# Patient Record
Sex: Female | Born: 1940
Health system: Southern US, Community
[De-identification: ages and names within clinical notes are randomized; demographics above are authoritative.]

## PROBLEM LIST (undated history)

## (undated) DIAGNOSIS — R002 Palpitations: Secondary | ICD-10-CM

## (undated) DIAGNOSIS — I10 Essential (primary) hypertension: Secondary | ICD-10-CM

## (undated) DIAGNOSIS — I4949 Other premature depolarization: Secondary | ICD-10-CM

## (undated) DIAGNOSIS — E785 Hyperlipidemia, unspecified: Secondary | ICD-10-CM

## (undated) DIAGNOSIS — I499 Cardiac arrhythmia, unspecified: Secondary | ICD-10-CM

## (undated) DIAGNOSIS — M1990 Unspecified osteoarthritis, unspecified site: Secondary | ICD-10-CM

## (undated) DIAGNOSIS — I77810 Thoracic aortic ectasia: Secondary | ICD-10-CM

## (undated) DIAGNOSIS — N189 Chronic kidney disease, unspecified: Secondary | ICD-10-CM

## (undated) HISTORY — PX: EYE SURGERY: SHX253

## (undated) HISTORY — PX: HERNIA REPAIR: SHX51

## (undated) HISTORY — DX: Hyperlipidemia, unspecified: E78.5

## (undated) HISTORY — PX: APPENDECTOMY: SHX54

## (undated) HISTORY — DX: Essential (primary) hypertension: I10

## (undated) HISTORY — DX: Other premature depolarization: I49.49

## (undated) HISTORY — DX: Chronic kidney disease, unspecified: N18.9

---

## 2003-11-25 ENCOUNTER — Other Ambulatory Visit: Payer: Self-pay

## 2005-10-02 ENCOUNTER — Ambulatory Visit: Payer: Self-pay | Admitting: Ophthalmology

## 2005-10-16 ENCOUNTER — Ambulatory Visit: Payer: Self-pay | Admitting: Ophthalmology

## 2007-03-10 ENCOUNTER — Ambulatory Visit: Payer: Self-pay

## 2007-03-19 ENCOUNTER — Emergency Department (HOSPITAL_COMMUNITY): Admission: EM | Admit: 2007-03-19 | Discharge: 2007-03-19 | Payer: Self-pay | Admitting: Emergency Medicine

## 2007-03-27 ENCOUNTER — Ambulatory Visit: Payer: Self-pay | Admitting: Cardiology

## 2007-04-01 ENCOUNTER — Encounter: Payer: Self-pay | Admitting: Family Medicine

## 2007-04-14 ENCOUNTER — Encounter: Payer: Self-pay | Admitting: Cardiology

## 2007-04-14 ENCOUNTER — Ambulatory Visit: Payer: Self-pay

## 2007-05-11 ENCOUNTER — Ambulatory Visit: Payer: Self-pay | Admitting: Family Medicine

## 2007-05-11 DIAGNOSIS — I10 Essential (primary) hypertension: Secondary | ICD-10-CM | POA: Insufficient documentation

## 2007-05-11 DIAGNOSIS — M5126 Other intervertebral disc displacement, lumbar region: Secondary | ICD-10-CM | POA: Insufficient documentation

## 2007-05-11 DIAGNOSIS — E785 Hyperlipidemia, unspecified: Secondary | ICD-10-CM | POA: Insufficient documentation

## 2007-09-03 ENCOUNTER — Ambulatory Visit: Payer: Self-pay | Admitting: Family Medicine

## 2007-09-03 DIAGNOSIS — L299 Pruritus, unspecified: Secondary | ICD-10-CM | POA: Insufficient documentation

## 2007-11-18 ENCOUNTER — Ambulatory Visit: Payer: Self-pay | Admitting: Family Medicine

## 2007-11-19 ENCOUNTER — Ambulatory Visit: Payer: Self-pay | Admitting: Family Medicine

## 2007-11-19 DIAGNOSIS — M25559 Pain in unspecified hip: Secondary | ICD-10-CM | POA: Insufficient documentation

## 2007-11-23 LAB — CONVERTED CEMR LAB
Albumin: 3.7 g/dL (ref 3.5–5.2)
Alkaline Phosphatase: 65 units/L (ref 39–117)
BUN: 24 mg/dL — ABNORMAL HIGH (ref 6–23)
GFR calc Af Amer: 49 mL/min
LDL Cholesterol: 50 mg/dL (ref 0–99)
Potassium: 4.4 meq/L (ref 3.5–5.1)
Sodium: 140 meq/L (ref 135–145)
Total CHOL/HDL Ratio: 2
Triglycerides: 92 mg/dL (ref 0–149)
VLDL: 18 mg/dL (ref 0–40)

## 2008-12-26 ENCOUNTER — Encounter: Payer: Self-pay | Admitting: Family Medicine

## 2008-12-28 LAB — CONVERTED CEMR LAB
Pap Smear: NORMAL
Pap Smear: NORMAL
Pap Smear: NORMAL
Pap Smear: NORMAL
Pap Smear: NORMAL
Pap Smear: NORMAL
Pap Smear: NORMAL

## 2008-12-28 LAB — HM PAP SMEAR

## 2009-02-08 LAB — HM MAMMOGRAPHY: HM Mammogram: NORMAL

## 2009-02-10 ENCOUNTER — Telehealth: Payer: Self-pay | Admitting: Family Medicine

## 2009-02-10 ENCOUNTER — Ambulatory Visit: Payer: Self-pay | Admitting: Family Medicine

## 2009-02-10 LAB — CONVERTED CEMR LAB
Bilirubin Urine: NEGATIVE
Glucose, Urine, Semiquant: NEGATIVE
Ketones, urine, test strip: NEGATIVE
RBC / HPF: 0
Urobilinogen, UA: 0.2
pH: 6

## 2009-02-11 ENCOUNTER — Other Ambulatory Visit: Payer: Self-pay

## 2009-02-16 ENCOUNTER — Ambulatory Visit: Payer: Self-pay | Admitting: Family Medicine

## 2009-02-17 ENCOUNTER — Encounter: Payer: Self-pay | Admitting: Family Medicine

## 2009-02-17 LAB — CONVERTED CEMR LAB: Protein, Ur: 24 mg/24hr — ABNORMAL LOW (ref 50–100)

## 2009-03-01 ENCOUNTER — Telehealth: Payer: Self-pay | Admitting: Family Medicine

## 2009-03-23 ENCOUNTER — Encounter (INDEPENDENT_AMBULATORY_CARE_PROVIDER_SITE_OTHER): Payer: Self-pay | Admitting: *Deleted

## 2009-03-23 ENCOUNTER — Encounter: Payer: Self-pay | Admitting: Family Medicine

## 2009-03-23 LAB — CONVERTED CEMR LAB
AST: 23 units/L
Albumin: 4.4 g/dL
Calcium: 9.6 mg/dL
Chloride: 101 meq/L
Creatinine, Ser: 1.24 mg/dL
Glucose, Bld: 106 mg/dL
Iron: 100 ug/dL
MCHC: 34.7 g/dL
MCV: 92 fL
Monocytes Absolute: 0.4 10*3/uL
Monocytes Relative: 7 %
Platelets: 227 10*3/uL
Potassium: 3.9 meq/L
RBC: 4.12 M/uL
Saturation Ratios: 28 %
Sodium: 140 meq/L
TIBC: 354 ug/dL
UIBC: 254 ug/dL

## 2009-03-29 ENCOUNTER — Encounter: Payer: Self-pay | Admitting: Family Medicine

## 2009-04-11 ENCOUNTER — Telehealth: Payer: Self-pay | Admitting: Family Medicine

## 2009-04-21 ENCOUNTER — Encounter (INDEPENDENT_AMBULATORY_CARE_PROVIDER_SITE_OTHER): Payer: Self-pay | Admitting: *Deleted

## 2009-05-10 ENCOUNTER — Ambulatory Visit: Payer: Self-pay | Admitting: Family Medicine

## 2009-05-10 DIAGNOSIS — N183 Chronic kidney disease, stage 3 unspecified: Secondary | ICD-10-CM | POA: Insufficient documentation

## 2009-05-15 ENCOUNTER — Encounter: Payer: Self-pay | Admitting: Family Medicine

## 2009-08-23 ENCOUNTER — Encounter: Payer: Self-pay | Admitting: Family Medicine

## 2009-11-09 ENCOUNTER — Telehealth: Payer: Self-pay | Admitting: Family Medicine

## 2009-11-14 ENCOUNTER — Ambulatory Visit: Payer: Self-pay | Admitting: Family Medicine

## 2009-11-14 DIAGNOSIS — R109 Unspecified abdominal pain: Secondary | ICD-10-CM | POA: Insufficient documentation

## 2009-11-14 LAB — CONVERTED CEMR LAB
Blood in Urine, dipstick: NEGATIVE
Glucose, Urine, Semiquant: NEGATIVE
Protein, U semiquant: 100
Urobilinogen, UA: 0.2
WBC Urine, dipstick: NEGATIVE
WBC, UA: 0 cells/hpf
pH: 6

## 2009-11-17 ENCOUNTER — Telehealth: Payer: Self-pay | Admitting: Family Medicine

## 2009-12-06 ENCOUNTER — Encounter: Payer: Self-pay | Admitting: Family Medicine

## 2009-12-12 ENCOUNTER — Encounter (INDEPENDENT_AMBULATORY_CARE_PROVIDER_SITE_OTHER): Payer: Self-pay | Admitting: *Deleted

## 2009-12-12 ENCOUNTER — Encounter: Payer: Self-pay | Admitting: Family Medicine

## 2009-12-12 LAB — CONVERTED CEMR LAB
AST: 20 units/L
Albumin: 4 g/dL
Calcium: 9.9 mg/dL
Chloride: 101 meq/L
Potassium: 4.9 meq/L
Sodium: 143 meq/L
Total Bilirubin: 0.3 mg/dL
Total Protein: 6.8 g/dL

## 2009-12-18 ENCOUNTER — Encounter: Payer: Self-pay | Admitting: Family Medicine

## 2009-12-18 LAB — CONVERTED CEMR LAB
ALT: 16 units/L
AST: 20 units/L
Creatinine, Ser: 117 mg/dL

## 2010-02-26 ENCOUNTER — Encounter: Payer: Self-pay | Admitting: Family Medicine

## 2010-03-01 ENCOUNTER — Encounter (INDEPENDENT_AMBULATORY_CARE_PROVIDER_SITE_OTHER): Payer: Self-pay | Admitting: *Deleted

## 2010-03-01 ENCOUNTER — Ambulatory Visit: Payer: Self-pay | Admitting: Family Medicine

## 2010-03-01 LAB — CONVERTED CEMR LAB
ALT: 14 units/L
AST: 19 units/L
Alkaline Phosphatase: 75 units/L
Basophils Absolute: 0 10*3/uL
Basophils Relative: 0 %
Calcium: 9.7 mg/dL (ref 8.4–10.5)
Creatinine, Ser: 1.1 mg/dL (ref 0.4–1.2)
Eosinophils Absolute: 0.1 10*3/uL
Glucose, Bld: 85 mg/dL (ref 70–99)
MCHC: 33.5 g/dL
MCV: 90 fL
Monocytes Relative: 6 %
Neutrophils Relative %: 62 %
Platelets: 226 10*3/uL
Potassium: 4.6 meq/L
RBC: 4.33 M/uL
RDW: 14.3 %
Sodium: 142 meq/L
Sodium: 144 meq/L (ref 135–145)
Total Bilirubin: 0.3 mg/dL
Total Protein: 6.8 g/dL
Uric Acid, Serum: 7.1 mg/dL — ABNORMAL HIGH (ref 2.4–7.0)

## 2010-03-08 ENCOUNTER — Encounter: Payer: Self-pay | Admitting: Family Medicine

## 2010-03-16 ENCOUNTER — Ambulatory Visit: Payer: Self-pay | Admitting: Family Medicine

## 2010-03-27 ENCOUNTER — Ambulatory Visit: Payer: Self-pay | Admitting: Family Medicine

## 2010-03-27 ENCOUNTER — Telehealth: Payer: Self-pay | Admitting: Family Medicine

## 2010-03-27 DIAGNOSIS — H6120 Impacted cerumen, unspecified ear: Secondary | ICD-10-CM | POA: Insufficient documentation

## 2010-04-27 ENCOUNTER — Ambulatory Visit: Payer: Self-pay | Admitting: Family Medicine

## 2010-04-27 DIAGNOSIS — R7989 Other specified abnormal findings of blood chemistry: Secondary | ICD-10-CM | POA: Insufficient documentation

## 2010-06-11 ENCOUNTER — Ambulatory Visit: Payer: Self-pay | Admitting: Family Medicine

## 2010-06-28 ENCOUNTER — Encounter: Payer: Self-pay | Admitting: Family Medicine

## 2010-08-06 ENCOUNTER — Ambulatory Visit: Payer: Self-pay | Admitting: Family Medicine

## 2010-08-06 DIAGNOSIS — J209 Acute bronchitis, unspecified: Secondary | ICD-10-CM | POA: Insufficient documentation

## 2010-10-14 LAB — CONVERTED CEMR LAB
BUN: 27 mg/dL — ABNORMAL HIGH (ref 6–23)
CO2: 31 meq/L (ref 19–32)
Chloride: 104 meq/L (ref 96–112)
GFR calc non Af Amer: 39.96 mL/min (ref >60)
Glucose, Bld: 95 mg/dL (ref 70–99)
Potassium: 4.1 meq/L (ref 3.5–5.1)
Sodium: 140 meq/L (ref 135–145)

## 2010-10-16 NOTE — Consult Note (Signed)
Summary: [facility]   Imported By: Sherian Rein 03/12/2010 08:34:58  _____________________________________________________________________  External Attachment:    Type:   Image     Comment:   External Document

## 2010-10-16 NOTE — Progress Notes (Signed)
Summary: Blood pressure readings  Phone Note Call from Patient Call back at Home Phone [phone]   Caller: Patient Call For: Kerby Nora MD Summary of Call: Blood pressure readings: 11/15/2009 140/82, 11/16/2009 144/80 pulse 60, 11/17/2009 122/80 pulse 76.   Initial call taken by: Linde Gillis CMA Duncan Dull),  November 17, 2009 9:21 AM  Follow-up for Phone Call        Okay to hold on starting new BP med prior to vacation to [place]..bu tcontinue to chesk BPs after returning home and call if more than 3 measurments above 140/90.  Follow-up by: Kerby Nora MD,  November 17, 2009 9:24 AM  Additional Follow-up for Phone Call Additional follow up Details #1::        Patient advised of instructions Additional Follow-up by: Benny Lennert CMA Duncan Dull),  November 17, 2009 9:35 AM

## 2010-10-16 NOTE — Consult Note (Signed)
Summary: [facility]   Imported By: Lanelle Bal 12/16/2009 09:42:32  _____________________________________________________________________  External Attachment:    Type:   Image     Comment:   External Document  Appended Document: [facility] Heather-please enter..lipids, Hg, TSH, creatinine, A1C and  AST/ALT  if present into EMR.  Thanks.

## 2010-10-16 NOTE — Miscellaneous (Signed)
Summary: Clinical Update/[facility]   Clinical Lists Changes  Observations: Added new observation of SGPT (ALT): 16 units/L (12/18/2009 10:57) Added new observation of SGOT (AST): 20 units/L (12/18/2009 10:57) Added new observation of CREATININE: 117 mg/dL (09/73/5329 92:42) Added new observation of CHOLESTEROL: 153 mg/dL (68/34/1962 22:97)

## 2010-10-16 NOTE — Progress Notes (Signed)
Summary: re: hctz   Phone Note Call from Patient Call back at [facility] Phone [phone]   Caller: Patient Call For: Kerby Nora MD Summary of Call: Patient called asking if she needs to stop taking her HCTZ.  Initial call taken by: Melody Comas,  March 27, 2010 4:51 PM  Follow-up for Phone Call        [place]...i sent flag earlier today to have you call Dr. Cherylann Ratel... can you get his number so i can call him about her meds.  Please call Natasha Sawyer and tell her to continue HCTZ until we are able to contact him...likely Friday.  Follow-up by: Kerby Nora MD,  March 27, 2010 5:37 PM  Additional Follow-up for Phone Call Additional follow up Details #1::        Patient advised.Consuello Masse CMA   Additional Follow-up by: Benny Lennert CMA Duncan Dull),  March 28, 2010 7:50 AM     Appended Document: re: hctz  I never heard back about this...see recent uric acid labs.   Appended Document: re: hctz  Patient advised.Consuello Masse CMA

## 2010-10-16 NOTE — Progress Notes (Signed)
Summary: refill request for [facility]  Phone Note Refill Request Call back at Home Phone [phone] Message from:  Patient  Refills Requested: Medication #1:  AMBIEN 5 MG  TABS 1 by mouth once daily Phoned request from pt, please send to [facility] [address]  Initial call taken by: Lowella Petties CMA,  November 09, 2009 12:44 PM  Follow-up for Phone Call        Rx called to pharmacy Follow-up by: Benny Lennert CMA Duncan Dull),  November 10, 2009 12:39 PM    Prescriptions: AMBIEN 5 MG  TABS (ZOLPIDEM TARTRATE) 1 by mouth once daily  #30 x 0   Entered and Authorized by:   Kerby Nora MD   Signed by:   Kerby Nora MD on 11/10/2009   Method used:   Telephoned to ...       [facility] [address] [phone]* (retail)       [address] [place], [place]  [id number]       Ph: [phone]       Fax: [phone]   RxID:   [id number]

## 2010-10-16 NOTE — Assessment & Plan Note (Signed)
Summary: TALK ABOUT URIC ACID LAB RESULTS   Vital Signs:  Patient profile:   70 year old female Height:      60.5 inches Weight:      156.6 pounds BMI:     30.19 Temp:     97.8 degrees F oral Pulse rate:   68 / minute Pulse rhythm:   regular BP sitting:   100 / 60  (left arm) Cuff size:   regular  Vitals Entered By: Benny Lennert CMA Duncan Dull) (March 27, 2010 9:16 AM)  History of Present Illness: Chief complaint Talk about Uric acid lab result  Seen in early June by Dr. Serita Butcher for the following: Pt here for right second toe that is slightly red, slightly swollen and slightly painful on the medial side distal phalanx that started yesterday. The nurse at [facility] thought it to be gout but is in atypical location and not nearly as acute as gout usually is. She denies bites, no unusal walking environments (she does wear sandals a lot) no edema of the feet otherwise, no new footwear or socks, soaps, etc. She otherwise feels well. Started yest AM when she awakened...it did not wake her up. Bothers her most on the side of the toe when walking. She is on a diuretic, has never had gout before, nor has anyone she knows of in her family. She has not radically altered her diet lately.  Uric acid level was 7.2. She reports that 2nd toe pain has improved over time in last few weeks. USed tylenol.  BP well controlled on amlodipine and 1/2 tab HCTZ.   Problems Prior to Update: 1)  Pain in Second Toe Right Foot  (ICD-729.5) 2)  Abdominal Pain, Lower  (ICD-789.09) 3)  Hyperkalemia  (ICD-276.7) 4)  Hip Pain, Left  (ICD-719.45) 5)  Chronic Kidney Disease Stage Iii (MODERATE)  (ICD-585.3) 6)  Unspecified Pruritic Disorder  (ICD-698.9) 7)  Hypertension  (ICD-401.9) 8)  Hyperlipidemia  (ICD-272.4) 9)  Cardiac Arrhythmias, Hx of Palpitations  (ICD-V12.50) 10)  Displacement, Lumbar Disc w/o Myelopathy  (ICD-722.10)  Current Medications (verified): 1)  Toprol Xl 25 Mg  Tb24 (Metoprolol Succinate)  .Marland Kitchen.. 1 By Mouth Once Daily 2)  Vytorin 10-20 Mg  Tabs (Ezetimibe-Simvastatin) .Marland Kitchen.. 1 By Mouth Once Daily 3)  Ambien 5 Mg  Tabs (Zolpidem Tartrate) .Marland Kitchen.. 1 By Mouth Once Daily 4)  Vitamin D 1000 Unit  Tabs (Cholecalciferol) .... Take 1 Tablet By Mouth Once A Day 5)  Hydrochlorothiazide 25 Mg Tabs (Hydrochlorothiazide) .... Take 1/2  Tablet By Mouth Once A Day 6)  Amlodipine Besylate 5 Mg Tabs (Amlodipine Besylate) .... Take 1 Tablet By Mouth Once A Day 7)  Enalapril Maleate 5 Mg Tabs (Enalapril Maleate) .... One Tablet Daily  Allergies (verified): No Known Drug Allergies  Past History:  Past medical, surgical, family and social histories (including risk factors) reviewed, and no changes noted (except as noted below).  Past Medical History: Reviewed history from 05/11/2007 and no changes required. Hyperlipidemia Hypertension  Past Surgical History: Reviewed history from 05/11/2007 and no changes required. MRI spine: bulging disc C6-C7  March 10, 2007, a CT of the chest showing no evidence of      pulmonary embolism.  There was a very small pericardial effusion      and small hiatal hernia.     b.     March 11, 2007, x-ray of the thoracic spine showing no      evidence of compression fracture of the thoracic spine.  There are      mild degenerative changes of the thoracic spine.     c.     March 14, 2007, MRI of the thoracic spine without contrast,      revealing very small focal protrusion of the T7-8, T9-10 without      narrowing of the spinal disks, without spinal canal stenosis or      narrowing of the neural foramina.     d.     March 11, 2007, x-ray of the cervical spine showing      degenerative joint disease of C5-C6 and C6-C7.     e.     March 14, 2007, MRI of the cervical spine without contrast      revealing mild disk bulging and uncinate spurring bilaterally at      C5-6 with moderate narrowing of the neural foramina bilaterally.      There is minimal bulging at C3-4 and mild  bulging at C5-6 and C6-  hernia repair 2003 stomach stapling 1980s 3 csections Appendectomy cataract and eyelid surgery EHCO EF 60% no pericardial effusion 03/2007 DXA 2007 wnl  Family History: Reviewed history from 05/11/2007 and no changes required. father: died age 74 cerebral hemmorhage mother died age late 1s DM no siblings no cancer no MI < 30  Social History: Reviewed history from 05/11/2007 and no changes required. lives at [facility] widowed  Review of Systems General:  Denies fatigue and fever. Eyes:  Denies blurring, discharge, and itching. ENT:  Complains of ringing in ears; denies nasal congestion, nosebleeds, and postnasal drainage; left ear full, decreased hearing   Ringin in both ears for years. itching in left ear for a while. Tried ear syringe at home...no wax came out. . CV:  Denies chest pain or discomfort. Resp:  Denies cough.  Physical Exam  General:  Well-developed,well-nourished,in no acute distress; alert,appropriate and cooperative throughout examination Eyes:  No corneal or conjunctival inflammation noted. EOMI. Perrla. Funduscopic exam benign, without hemorrhages, exudates or papilledema. Vision grossly normal. Ears:  left TM occluded with wax Nose:  External nasal examination shows no deformity or inflammation. Nasal mucosa are pink and moist without lesions or exudates. Mouth:  Oral mucosa and oropharynx without lesions or exudates.  Teeth in good repair. Neck:  no carotid bruit or thyromegaly no cervical or supraclavicular lymphadenopathy  Lungs:  Normal respiratory effort, chest expands symmetrically. Lungs are clear to auscultation, no crackles or wheezes. Heart:  Normal rate and regular rhythm. S1 and S2 normal without gallop, murmur, click, rub or other extra sounds. Msk:  lkeft 2nd DIP joint mild swelling, no erythema, no pain.  Ankel and foot exam wnl.  Pulses:  R and L posterior tibial pulses are full and equal bilaterally    Extremities:  no edema    Impression & Recommendations:  Problem # 1:  CERUMEN IMPACTION (ICD-380.4) Resolved with irrigation.  Mild abtraision in left ear canal after wax removed.   Problem # 2:  PAIN IN SECOND TOE RIGHT FOOT (ICD-729.5) Doubt gout, but uric acid elevated. Pt wishes me to speak with Dr. Cherylann Ratel about the issue. Paln on D/C HCTZ and recheck.   Problem # 3:  HYPERTENSION (ICD-401.9) Well controlled. Continue current medication.  Her updated medication list for this problem includes:    Toprol Xl 25 Mg Tb24 (Metoprolol succinate) .Marland Kitchen... 1 by mouth once daily    Hydrochlorothiazide 25 Mg Tabs (Hydrochlorothiazide) .Marland Kitchen... Take 1/2  tablet by mouth once a day  Amlodipine Besylate 5 Mg Tabs (Amlodipine besylate) .Marland Kitchen... Take 1 tablet by mouth once a day    Enalapril Maleate 5 Mg Tabs (Enalapril maleate) ..... One tablet daily  Complete Medication List: 1)  Toprol Xl 25 Mg Tb24 (Metoprolol succinate) .Marland Kitchen.. 1 by mouth once daily 2)  Vytorin 10-20 Mg Tabs (Ezetimibe-simvastatin) .Marland Kitchen.. 1 by mouth once daily 3)  Ambien 5 Mg Tabs (Zolpidem tartrate) .Marland Kitchen.. 1 by mouth once daily 4)  Vitamin D 1000 Unit Tabs (Cholecalciferol) .... Take 1 tablet by mouth once a day 5)  Hydrochlorothiazide 25 Mg Tabs (Hydrochlorothiazide) .... Take 1/2  tablet by mouth once a day 6)  Amlodipine Besylate 5 Mg Tabs (Amlodipine besylate) .... Take 1 tablet by mouth once a day 7)  Enalapril Maleate 5 Mg Tabs (Enalapril maleate) .... One tablet daily  Patient Instructions: 1)  We will call you regarding stopping HCTZ.  2)  Return in 1 month for uric acid.  Current Allergies (reviewed today): No known allergies

## 2010-10-16 NOTE — Consult Note (Signed)
Summary: [facility]   Imported By: Lanelle Bal 07/05/2010 11:36:04  _____________________________________________________________________  External Attachment:    Type:   Image     Comment:   External Document

## 2010-10-16 NOTE — Miscellaneous (Signed)
   Clinical Lists Changes  Observations: Added new observation of TSH: 48 microintl units/mL (03/01/2010 11:18) Added new observation of ABSOLUTE BAS: 0.0 K/uL (03/01/2010 11:18) Added new observation of BASOPHIL %: 0 % (03/01/2010 11:18) Added new observation of EOS ABSLT: 0.1 K/uL (03/01/2010 11:18) Added new observation of % EOS AUTO: 2 % (03/01/2010 11:18) Added new observation of ABSOLUTE MON: 0.3 K/uL (03/01/2010 11:18) Added new observation of MONOCYTE %: 6 % (03/01/2010 11:18) Added new observation of ABS LYMPHOCY: 1.7 K/uL (03/01/2010 11:18) Added new observation of LYMPHS %: 30 % (03/01/2010 11:18) Added new observation of ABS NEUTROPH: 3.5 K/uL (03/01/2010 11:18) Added new observation of PMN %: 62 % (03/01/2010 11:18) Added new observation of PLATELETK/UL: 226 K/uL (03/01/2010 11:18) Added new observation of RDW: 14.3 % (03/01/2010 11:18) Added new observation of MCHC RBC: 33.5 g/dL (16/06/9603 54:09) Added new observation of MCV: 90 fL (03/01/2010 11:18) Added new observation of HCT: 38.0 % (03/01/2010 11:18) Added new observation of HGB: 13.0 g/dL (81/19/1478 29:56) Added new observation of RBC M/UL: 4.33 M/uL (03/01/2010 11:18) Added new observation of WBC COUNT: 5.6 10*3/microliter (03/01/2010 11:18) Added new observation of CALCIUM: 9.6 mg/dL (21/30/8657 84:69) Added new observation of ALBUMIN: 4.3 g/dL (62/95/2841 32:44) Added new observation of PROTEIN, TOT: 6.8 g/dL (09/18/7251 66:44) Added new observation of SGPT (ALT): 14 units/L (03/01/2010 11:18) Added new observation of SGOT (AST): 19 units/L (03/01/2010 11:18) Added new observation of ALK PHOS: 75 units/L (03/01/2010 11:18) Added new observation of BILI TOTAL: 0.3 mg/dL (03/47/4259 56:38) Added new observation of GFRAA: 55 mL/min (03/01/2010 11:18) Added new observation of GFR: 47 mL/min (03/01/2010 11:18) Added new observation of CREATININE: 1.19 mg/dL (75/64/3329 51:88) Added new observation of BUN: 27  mg/dL (41/66/0630 16:01) Added new observation of BG RANDOM: 108 mg/dL (09/32/3557 32:20) Added new observation of CL SERUM: 102 meq/L (03/01/2010 11:18) Added new observation of K SERUM: 4.6 meq/L (03/01/2010 11:18) Added new observation of NA: 142 meq/L (03/01/2010 11:18)

## 2010-10-16 NOTE — Miscellaneous (Signed)
   Clinical Lists Changes  Observations: Added new observation of TRIGLYCERIDE: 68 mg/dL (60/45/4098 11:91) Added new observation of LDL: 60 mg/dL (47/82/9562 13:08) Added new observation of HDL: 68 mg/dL (65/78/4696 29:52) Added new observation of CHOLESTEROL: 153 mg/dL (84/13/2440 10:27) Added new observation of CALCIUM: 9.9 mg/dL (25/36/6440 34:74) Added new observation of ALBUMIN: 4.0 g/dL (25/95/6387 56:43) Added new observation of PROTEIN, TOT: 6.8 g/dL (32/95/1884 16:60) Added new observation of SGPT (ALT): 16 units/L (12/12/2009 14:13) Added new observation of SGOT (AST): 20 units/L (12/12/2009 14:13) Added new observation of BILI TOTAL: 0.3 mg/dL (63/09/6008 93:23) Added new observation of GFRAA: 56 mL/min (12/12/2009 14:13) Added new observation of GFR: 46 mL/min (12/12/2009 14:13) Added new observation of CREATININE: 1.17 mg/dL (55/73/2202 54:27) Added new observation of BUN: 24 mg/dL (03/09/7627 31:51) Added new observation of BG RANDOM: 102 mg/dL (76/16/0737 10:62) Added new observation of CL SERUM: 101 meq/L (12/12/2009 14:13) Added new observation of K SERUM: 4.9 meq/L (12/12/2009 14:13) Added new observation of NA: 143 meq/L (12/12/2009 14:13)

## 2010-10-16 NOTE — Assessment & Plan Note (Signed)
Summary: GOUT?  BEDSOLE IS FULL/DLO   Vital Signs:  Patient profile:   70 year old female Weight:      155 pounds Temp:     98.2 degrees F oral Pulse rate:   68 / minute Pulse rhythm:   regular BP sitting:   126 / 86  (left arm) Cuff size:   large  Vitals Entered By: Sydell Axon LPN (March 01, 2010 11:41 AM) CC: ? Gout, red, swollen and painful 2nd toe on right foot   History of Present Illness: Pt here for right second toe that is slightly red, slightly swollen and slightly painful on the medial side distal phalanx that started yesterday. The nurse at [facility] thought it to be gout but is in atypical location and not nearly as acute as gout usually is. She denies bites, no unusal walking environments (she does wear sandals a lot) no edema of the feet otherwise, no new footwear or socks, soaps, etc. She otherwise feels well. Started yest AM when she awakened...it did not wake her up. Bothers her most on the side of the toe when walking. She is on a diuretic, has never had gout before, nor has anyone she knows of in her family. She has not radically altered her diet lately.  Problems Prior to Update: 1)  Abdominal Pain, Lower  (ICD-789.09) 2)  Hyperkalemia  (ICD-276.7) 3)  Hip Pain, Left  (ICD-719.45) 4)  Chronic Kidney Disease Stage Iii (MODERATE)  (ICD-585.3) 5)  Unspecified Pruritic Disorder  (ICD-698.9) 6)  Hypertension  (ICD-401.9) 7)  Hyperlipidemia  (ICD-272.4) 8)  Cardiac Arrhythmias, Hx of Palpitations  (ICD-V12.50) 9)  Displacement, Lumbar Disc w/o Myelopathy  (ICD-722.10)  Medications Prior to Update: 1)  Toprol Xl 25 Mg  Tb24 (Metoprolol Succinate) .Marland Kitchen.. 1 By Mouth Once Daily 2)  Vytorin 10-20 Mg  Tabs (Ezetimibe-Simvastatin) .Marland Kitchen.. 1 By Mouth Once Daily 3)  Ambien 5 Mg  Tabs (Zolpidem Tartrate) .Marland Kitchen.. 1 By Mouth Once Daily 4)  Meridia 15 Mg Caps (Sibutramine Hcl Monohydrate) .... Take 1 Capsule By Mouth Once A Day 5)  Vitamin D 1000 Unit  Tabs (Cholecalciferol) .... Take  1 Tablet By Mouth Once A Day 6)  Hydrochlorothiazide 25 Mg Tabs (Hydrochlorothiazide) .... Take 1/2  Tablet By Mouth Once A Day 7)  Amlodipine Besylate 5 Mg Tabs (Amlodipine Besylate) .... Take 1 Tablet By Mouth Once A Day 8)  Enalapril Maleate 20 Mg Tabs (Enalapril Maleate) .... Once Daily  Allergies: No Known Drug Allergies  Physical Exam  General:  overweight appearing female in NAD Head:  Normocephalic and atraumatic without obvious abnormalities. No apparent alopecia or balding. Eyes:  Conjunctiva clear bilaterally.  Extremities:  Right foot, secon toe mildly swollen over DIP joint on the medial side and mildly tender to palpation with some swelling and erythema, nminimal warmth. No obvious sign of bite or skin break.   Impression & Recommendations:  Problem # 1:  PAIN IN SECOND TOE RIGHT FOOT (ICD-729.5) Assessment New Poss gout but very atypical location and presentation. Will check a Uric Acid today.  Due to her advanced Kidney Disease, will avoid Colchicine or NSAID. She thinks she can get by with TYL...suggest ES 2 tabs 4 times a day if needed to keep pain under control. If U.A nml, repeat in 2-3 weeks. Will report on phone tree. Orders: TLB-Renal Function Panel ([id number]) TLB-Uric Acid, Blood ([id number]-URIC) Venipuncture (16109)  Complete Medication List: 1)  Toprol Xl 25 Mg Tb24 (Metoprolol succinate) .Marland Kitchen.. 1 by  mouth once daily 2)  Vytorin 10-20 Mg Tabs (Ezetimibe-simvastatin) .Marland Kitchen.. 1 by mouth once daily 3)  Ambien 5 Mg Tabs (Zolpidem tartrate) .Marland Kitchen.. 1 by mouth once daily 4)  Meridia 15 Mg Caps (Sibutramine hcl monohydrate) .... Take 1 capsule by mouth once a day 5)  Vitamin D 1000 Unit Tabs (Cholecalciferol) .... Take 1 tablet by mouth once a day 6)  Hydrochlorothiazide 25 Mg Tabs (Hydrochlorothiazide) .... Take 1/2  tablet by mouth once a day 7)  Amlodipine Besylate 5 Mg Tabs (Amlodipine besylate) .... Take 1 tablet by mouth once a day 8)  Enalapril Maleate 20 Mg Tabs  (Enalapril maleate) .... Once daily  Patient Instructions: 1)  Report lab result via phone tree.  Current Allergies (reviewed today): No known allergies

## 2010-10-16 NOTE — Assessment & Plan Note (Signed)
Summary: roa 6 mths cyd   Vital Signs:  Patient profile:   70 year old female Height:      60.5 inches Weight:      154.4 pounds BMI:     29.77 Temp:     97.9 degrees F oral Pulse rate:   76 / minute Pulse rhythm:   regular BP sitting:   150 / 80  (left arm) Cuff size:   large  Vitals Entered By: Benny Lennert CMA Duncan Dull) (November 14, 2009 8:34 AM)  History of Present Illness: Chief complaint 6 month follow up  Stage 3 CKD, stable. Dr. Cherylann Ratel. At last appt..BP 114/70.Marland Kitchenno med changes.   In last 3 days right lower abdominal pain, no dysuria, increase in urinary urgency. No increase in frequency. Some urinae odor and no blood in urine.   Hypertension History:      She complains of neurologic problems, but denies headache, chest pain, palpitations, dyspnea with exertion, and side effects from treatment.  2 weeks ago  had episode of feeling hot, felt pale, lightheaded during hand bell practice. No issues since. BP was  109/79 Does not usualy check BP at home.        Positive major cardiovascular risk factors include female age 56 years old or older, hyperlipidemia, and hypertension.  Negative major cardiovascular risk factors include non-tobacco-user status.     Problems Prior to Update: 1)  Abdominal Pain, Lower  (ICD-789.09) 2)  Hyperkalemia  (ICD-276.7) 3)  Hip Pain, Left  (ICD-719.45) 4)  Chronic Kidney Disease Stage Iii (MODERATE)  (ICD-585.3) 5)  Unspecified Pruritic Disorder  (ICD-698.9) 6)  Hypertension  (ICD-401.9) 7)  Hyperlipidemia  (ICD-272.4) 8)  Cardiac Arrhythmias, Hx of Palpitations  (ICD-V12.50) 9)  Displacement, Lumbar Disc w/o Myelopathy  (ICD-722.10)  Current Medications (verified): 1)  Toprol Xl 25 Mg  Tb24 (Metoprolol Succinate) .Marland Kitchen.. 1 By Mouth Once Daily 2)  Vytorin 10-20 Mg  Tabs (Ezetimibe-Simvastatin) .Marland Kitchen.. 1 By Mouth Once Daily 3)  Ambien 5 Mg  Tabs (Zolpidem Tartrate) .Marland Kitchen.. 1 By Mouth Once Daily 4)  Meridia 15 Mg Caps (Sibutramine Hcl Monohydrate)  .... Take 1 Capsule By Mouth Once A Day 5)  Vitamin D 1000 Unit  Tabs (Cholecalciferol) .... Take 1 Tablet By Mouth Once A Day 6)  Hydrochlorothiazide 25 Mg Tabs (Hydrochlorothiazide) .... Take 1/2  Tablet By Mouth Once A Day 7)  Amlodipine Besylate 5 Mg Tabs (Amlodipine Besylate) .... Take 1 Tablet By Mouth Once A Day 8)  Enalapril Maleate 20 Mg Tabs (Enalapril Maleate) .... Once Daily  Allergies (verified): No Known Drug Allergies  Past History:  Past medical, surgical, family and social histories (including risk factors) reviewed, and no changes noted (except as noted below).  Past Medical History: Reviewed history from 05/11/2007 and no changes required. Hyperlipidemia Hypertension  Past Surgical History: Reviewed history from 05/11/2007 and no changes required. MRI spine: bulging disc C6-C7  March 10, 2007, a CT of the chest showing no evidence of      pulmonary embolism.  There was a very small pericardial effusion      and small hiatal hernia.     b.     March 11, 2007, x-ray of the thoracic spine showing no      evidence of compression fracture of the thoracic spine.  There are      mild degenerative changes of the thoracic spine.     c.     March 14, 2007, MRI of the thoracic spine  without contrast,      revealing very small focal protrusion of the T7-8, T9-10 without      narrowing of the spinal disks, without spinal canal stenosis or      narrowing of the neural foramina.     d.     March 11, 2007, x-ray of the cervical spine showing      degenerative joint disease of C5-C6 and C6-C7.     e.     March 14, 2007, MRI of the cervical spine without contrast      revealing mild disk bulging and uncinate spurring bilaterally at      C5-6 with moderate narrowing of the neural foramina bilaterally.      There is minimal bulging at C3-4 and mild bulging at C5-6 and C6-  hernia repair 2003 stomach stapling 1980s 3 csections Appendectomy cataract and eyelid surgery EHCO EF 60% no  pericardial effusion 03/2007 DXA 2007 wnl  Family History: Reviewed history from 05/11/2007 and no changes required. father: died age 44 cerebral hemmorhage mother died age late 72s DM no siblings no cancer no MI < 56  Social History: Reviewed history from 05/11/2007 and no changes required. lives at [facility] widowed  Review of Systems General:  Denies fatigue and fever. CV:  Denies chest pain or discomfort. Resp:  Denies shortness of breath. GI:  Complains of abdominal pain; denies bloody stools, constipation, dark tarry stools, and diarrhea. GU:  Denies abnormal vaginal bleeding and discharge.  Physical Exam  General:  overweight appearing female in NAD Mouth:  Oral mucosa and oropharynx without lesions or exudates.  Teeth in good repair. Neck:  no carotid bruit or thyromegaly no cervical or supraclavicular lymphadenopathy  Lungs:  Normal respiratory effort, chest expands symmetrically. Lungs are clear to auscultation, no crackles or wheezes. Heart:  Normal rate and regular rhythm. S1 and S2 normal without gallop, murmur, click, rub or other extra sounds. Abdomen:  Bowel sounds positive,abdomen soft and mild pressure in right central lower abdomen masses, organomegaly or hernias noted. No CVA tenderness Pulses:  R and L posterior tibial pulses are full and equal bilaterally  Extremities:  trace left pedal edema and trace right pedal edema.   Skin:  Intact without suspicious lesions or rashes Psych:  Cognition and judgment appear intact. Alert and cooperative with normal attention span and concentration. No apparent delusions, illusions, hallucinations   Impression & Recommendations:  Problem # 1:  ABDOMINAL PAIN, LOWER (ICD-789.09) ? bladder irritation from increase in orange juice.Marland Kitchenavoid bladder irritants. Increase fluids some. No sign of infeciton. Call if not improving.  Orders: UA Dipstick W/ Micro (manual) ([id number])  Problem # 2:  HYPERTENSION (ICD-401.9) ? poor  control...had been low recently. Will have her follow ayt [place] in the next few days..call with measurments. Going to [place] on Friday.  Her updated medication list for this problem includes:    Toprol Xl 25 Mg Tb24 (Metoprolol succinate) .Marland Kitchen... 1 by mouth once daily    Hydrochlorothiazide 25 Mg Tabs (Hydrochlorothiazide) .Marland Kitchen... Take 1/2  tablet by mouth once a day    Amlodipine Besylate 5 Mg Tabs (Amlodipine besylate) .Marland Kitchen... Take 1 tablet by mouth once a day    Enalapril Maleate 20 Mg Tabs (Enalapril maleate) ..... Once daily  Problem # 3:  HYPERLIPIDEMIA (ICD-272.4) Due for reeval. Will obtain at [facility] as well as [facility]FT[facility].  Her updated medication list for this problem includes:    Vytorin 10-20 Mg Tabs (Ezetimibe-simvastatin) .Marland Kitchen... 1 by mouth once  daily  Problem # 4:  Preventive Health Care (ICD-V70.0) Assessment: Comment Only Due for CPX..has at GYN but plans to chnge to here.   Problem # 5:  CHRONIC KIDNEY DISEASE STAGE III (MODERATE) (ICD-585.3) stable   Complete Medication List: 1)  Toprol Xl 25 Mg Tb24 (Metoprolol succinate) .Marland Kitchen.. 1 by mouth once daily 2)  Vytorin 10-20 Mg Tabs (Ezetimibe-simvastatin) .Marland Kitchen.. 1 by mouth once daily 3)  Ambien 5 Mg Tabs (Zolpidem tartrate) .Marland Kitchen.. 1 by mouth once daily 4)  Meridia 15 Mg Caps (Sibutramine hcl monohydrate) .... Take 1 capsule by mouth once a day 5)  Vitamin D 1000 Unit Tabs (Cholecalciferol) .... Take 1 tablet by mouth once a day 6)  Hydrochlorothiazide 25 Mg Tabs (Hydrochlorothiazide) .... Take 1/2  tablet by mouth once a day 7)  Amlodipine Besylate 5 Mg Tabs (Amlodipine besylate) .... Take 1 tablet by mouth once a day 8)  Enalapril Maleate 20 Mg Tabs (Enalapril maleate) .... Once daily  Hypertension Assessment/Plan:      The patient's hypertensive risk group is category B: At least one risk factor (excluding diabetes) with no target organ damage.  Her calculated 10 year risk of coronary heart disease is 7 %.  Today's blood pressure is  150/80.  Her blood pressure goal is < 140/90.  Patient Instructions: 1)  Follow BP at home..call with measurements in next few days before vacation. 2)  Have Dr. Cherylann Ratel add lipids to upcoming labs.  3)  Increase water some, avoid citris, caffeine, acidic foods, caffeine. 4)  Call if abdominal pain not imprpving.   Current Allergies (reviewed today): No known allergies   Laboratory Results   Urine Tests  Date/Time Received: November 14, 2009 9:22 AM  Date/Time Reported: November 14, 2009 9:22 AM   Routine Urinalysis   Color: yellow Appearance: Clear Glucose: negative   (Normal Range: Negative) Bilirubin: small   (Normal Range: Negative) Ketone: negative   (Normal Range: Negative) Spec. Gravity: 1.015   (Normal Range: 1.003-1.035) Blood: negative   (Normal Range: Negative) pH: 6.0   (Normal Range: 5.0-8.0) Protein: 100   (Normal Range: Negative) Urobilinogen: 0.2   (Normal Range: 0-1) Nitrite: negative   (Normal Range: Negative) Leukocyte Esterace: negative   (Normal Range: Negative)  Urine Microscopic WBC/HPF: 0 RBC/HPF: 0

## 2010-10-16 NOTE — Assessment & Plan Note (Signed)
Summary: rule out pneumonia/dlo   Vital Signs:  Patient profile:   70 year old female Height:      60.5 inches Weight:      158.12 pounds BMI:     30.48 Temp:     98.1 degrees F oral Pulse rate:   60 / minute Pulse rhythm:   irregular BP sitting:   110 / 72  (left arm) Cuff size:   regular  Vitals Entered ByMelody Comas (August 06, 2010 4:45 PM) CC: cough and congestion  Comments Patient concerned about possible pneumonia, on last day of zpak.   History of Present Illness: CC: head cold, cough  11 d h/o ST, head cold, runny nose, cough.  Finishing zpack.  Head cold/congestion much better, but cough remaining.  Cough dry but at times feels like could cough up something.  Wants lungs listened to to ensure not PNA.  hasn't tried anything for cough, doesn't think will help.  intermittent cough.   No HA, ear pain, abd pain, n/v/d, rashes, myalgia/arthralgia  No sick contacts at home, no smoking.  daughter is PA.  Current Medications (verified): 1)  Toprol Xl 25 Mg  Tb24 (Metoprolol Succinate) .Marland Kitchen.. 1 By Mouth Once Daily 2)  Vytorin 10-20 Mg  Tabs (Ezetimibe-Simvastatin) .Marland Kitchen.. 1 By Mouth Once Daily 3)  Ambien 5 Mg  Tabs (Zolpidem Tartrate) .Marland Kitchen.. 1 By Mouth Once Daily 4)  Vitamin D 1000 Unit  Tabs (Cholecalciferol) .... Take 1 Tablet By Mouth Once A Day 5)  Amlodipine Besylate 5 Mg Tabs (Amlodipine Besylate) .... Take 1 Tablet By Mouth Once A Day 6)  Enalapril Maleate 5 Mg Tabs (Enalapril Maleate) .... One Tablet Daily  Allergies (verified): No Known Drug Allergies  Past History:  Past Medical History: Last updated: 05/11/2007 Hyperlipidemia Hypertension  Social History: lives at [facility] Widowed Dauther is PA  Review of Systems       per HPI  Physical Exam  General:  Well-developed,well-nourished,in no acute distress; alert,appropriate and cooperative throughout examination Head:  Normocephalic and atraumatic without obvious abnormalities. No apparent  alopecia or balding. Eyes:  PERRLA, EOMI Ears:  TMs clear Nose:  nares clear Mouth:  no pharyngeal erythema, MMM Neck:  no carotid bruit or thyromegaly no cervical or supraclavicular lymphadenopathy  Lungs:  Normal respiratory effort, chest expands symmetrically. Lungs are clear to auscultation, no crackles or wheezes. Heart:  Normal rate and regular rhythm. S1 and S2 normal without gallop, murmur, click, rub or other extra sounds. Pulses:  2+ rad pulses Extremities:  no pedal edema Skin:  Intact without suspicious lesions or rashes   Impression & Recommendations:  Problem # 1:  ACUTE BRONCHITIS (ICD-466.0) rec continued supportive care.  cough continued, but no fever or other indications of PNA.  lungs clear today.  red flags to return discussed.  advised to finish zpack.  Complete Medication List: 1)  Toprol Xl 25 Mg Tb24 (Metoprolol succinate) .Marland Kitchen.. 1 by mouth once daily 2)  Vytorin 10-20 Mg Tabs (Ezetimibe-simvastatin) .Marland Kitchen.. 1 by mouth once daily 3)  Ambien 5 Mg Tabs (Zolpidem tartrate) .Marland Kitchen.. 1 by mouth once daily 4)  Vitamin D 1000 Unit Tabs (Cholecalciferol) .... Take 1 tablet by mouth once a day 5)  Amlodipine Besylate 5 Mg Tabs (Amlodipine besylate) .... Take 1 tablet by mouth once a day 6)  Enalapril Maleate 5 Mg Tabs (Enalapril maleate) .... One tablet daily  Patient Instructions: 1)  Sounds like a bronchitis.  Finish zpack and I would expect you to  feel better.  Cough after viral illness or of bronchitis can last several weeks to go away. 2)  Push fluids, plenty of rest over next several days. 3)  Guaifenesin IR 400 mg 1 in am and 1 at noon with plenty of fluid to help mobilize mucous out. 4)  Reason to come back - start having fevers >101.5, any shortness of breath or worsening breathing, or worsening instead of improving. 5)  Call clinic with questions - good to see you today.   Orders Added: 1)  Est. Patient Level III [16109]    Prior Medications (reviewed  today): TOPROL XL 25 MG  TB24 (METOPROLOL SUCCINATE) 1 by mouth once daily VYTORIN 10-20 MG  TABS (EZETIMIBE-SIMVASTATIN) 1 by mouth once daily AMBIEN 5 MG  TABS (ZOLPIDEM TARTRATE) 1 by mouth once daily VITAMIN D 1000 UNIT  TABS (CHOLECALCIFEROL) Take 1 tablet by mouth once a day AMLODIPINE BESYLATE 5 MG TABS (AMLODIPINE BESYLATE) Take 1 tablet by mouth once a day ENALAPRIL MALEATE 5 MG TABS (ENALAPRIL MALEATE) one tablet daily Current Allergies (reviewed today): No known allergies

## 2011-01-24 ENCOUNTER — Other Ambulatory Visit: Payer: Self-pay | Admitting: Family Medicine

## 2011-01-29 NOTE — Assessment & Plan Note (Signed)
[facility]                            CARDIOLOGY OFFICE NOTE   NAME:Natasha Sawyer, Natasha Sawyer                          MRN:          [id number]  DATE:03/27/2007                            DOB:          11/30/1940    PRIMARY CARDIOLOGIST:  The patient was previously seen by Dr. Andee Lineman,  however, has not been seen since 2005, and will now see Dr. Daleen Squibb.   PATIENT PROFILE:  A 70 year old Caucasian female with a prior history of  hypertension and hyperlipidemia who presents for evaluation of left  scapular neck pain.   PROBLEM LIST:  1. Left scapular neck pain.      a.     March 10, 2007, a CT of the chest showing no evidence of       pulmonary embolism.  There was a very small pericardial effusion       and small hiatal hernia.      b.     March 11, 2007, x-ray of the thoracic spine showing no       evidence of compression fracture of the thoracic spine.  There are       mild degenerative changes of the thoracic spine.      c.     March 14, 2007, MRI of the thoracic spine without contrast,       revealing very small focal protrusion of the T7-8, T9-10 without       narrowing of the spinal disks, without spinal canal stenosis or       narrowing of the neural foramina.      d.     March 11, 2007, x-ray of the cervical spine showing       degenerative joint disease of C5-C6 and C6-C7.      e.     March 14, 2007, MRI of the cervical spine without contrast       revealing mild disk bulging and uncinate spurring bilaterally at       C5-6 with moderate narrowing of the neural foramina bilaterally.       There is minimal bulging at C3-4 and mild bulging at C5-6 and C6-       7.  2. Degenerative disk disease and bulging disks as outlined above.  3. Hypertension.  4. Hyperlipidemia.  5. History of elevated D-dimer with negative chest CT as above.  6. Insomnia.  7. Status post hernia repair.  8. Status post stomach stapling.  9. History of cataracts.  10.History of eyelid  surgery.  11.Status post C-section x3.  12.History of presyncope and palpitations.   HISTORY OF PRESENT ILLNESS:  A 70 year old Caucasian female with the  above problem list, who was in her usual state of health till  approximately 3 weeks ago when she had sudden onset of left scapular and  neck pain with radiation down the left arm.  There was no associated  shortness of breath, nausea, vomiting, or diaphoresis.  She was seen by  an urgent care physician and was placed on hydrocodone therapy with  improvement in symptoms but not complete relief.  On July 3rd, she had  acute worsening of the left scapular discomfort and presented to the  [facility] ED, where her pain was felt to be non-cardiac.  She  was given oxycodone and her hydrocodone was discontinued.  She was also  given some Valium.  She has a nephew who is a Midwife in [place]  and he prescribed her a prednisone taper.  Between the Valium, the  prednisone, and the oxycodone she has had marked improvement in her  scapular discomfort and notes that it is now just a 1/10.  For the past  3 weeks, her pain has been constant and has not really ever gone away.  It has been at a minimum a 1/10 and prior to July 3rd, it was much worse  than that.  She denies any chest pain, pressure, tightness, or burning.  She denies any dyspnea on exertion, PND, orthopnea, dizziness, syncope,  or edema.  She has remained active and does a lot of volunteer work at  [employer] where she lives and with the exception of her  scapular discomfort, has had no other limitations.   ALLERGIES:  No known drug allergies.   HOME MEDICATIONS:  1. Triamterene HCTZ 37.5/25 mg daily.  2. Lisinopril 5 mg daily.  3. Toprol XL 25 mg daily.  4. Vytorin 10/20 mg daily.  5. Ambien 10 mg, half tablet daily.  6. Oxycodone 5/325 mg p.r.n.  7. Prednisone taper.   FAMILY HISTORY:  Mother died of complications of diabetes age 36.  Father  died of a cerebral hemorrhage age 28.  She has no siblings.   SOCIAL HISTORY:  She denies any tobacco, alcohol, or drug use.  She is  active without exercising.  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  She lives in [place] and is  widowed.   REVIEW OF SYSTEMS:  Positive for left scapular and arm pain, improved  with narcotic and steroid therapy.  Otherwise all systems reviewed and  negative.   PHYSICAL EXAMINATION:  VITAL SIGNS:  Blood pressure 128/80, heart rate  68, respirations 16, weight is 152 pounds, she is afebrile.  GENERAL:  A pleasant white female in no acute distress.  Awake, alert,  oriented x3.  HEENT:  Normal.  NECK:  No bruits or JVD.  There is no reproducible pain.  LUNGS:  Respirations were unlabored.  Clear to auscultation.  CARDIAC:  Regular S1 S2.  No S3, S4, murmurs.  ABDOMEN:  Obese, soft, nontender, nondistended.  Bowel sounds present  x4.  EXTREMITIES:  Warm, dry, pink.  No clubbing, cyanosis, or edema.  Dorsalis pedis posterior tibialis pulses 2+ and equal bilaterally.  NEUROLOGIC:  Grossly intact and nonfocal.  Strength is 5/5 and equal in  the bilateral upper and lower extremities.   ACCESSORY CLINICAL FINDINGS:  ECG from [facility] on  June 24th, shows a sinus rhythm without any acute ST-T changes.  Radiology reports as outlined in the problem list.   ASSESSMENT/PLAN:  1. Small pericardial effusion.  This was picked up on a CT scan which      was done to rule out pulmonary embolism, which it did.  We will      obtain a 2D echocardiogram just to follow up the small pericardial      effusion.  2. Left scapular and arm pain.  This has been extensively evaluated      with multiple radiologic  exams including CT and MRI and MRA of the      thoracic and cervical spine.  She does have some bulging disks with      what appears to be minimal narrowing of the neural foramina.  This      has been followed  by primary care as well as evaluated by her      nephew who is a Midwife in [place].  The patient tells me      that there are no plans for surgery at this point.  Given her lack      of chest pain or dyspnea or change in functional capacity, there      does not appear to be any ischemic or cardiac cause for her      symptoms.  As above, we will check a 2D echocardiogram.  Providing      that her left ventricular function is normal, we would not perform      any additional cardiac testing at this time.  3. Hypertension.  This is under good control.  She remains on a beta-      blocker, ACE inhibitor, and diuretic therapy.  We refilled her beta-      blocker today as she has been out of it.  4. Hyperlipidemia.  This is treated with Vytorin and followed by      primary care.  5. Insomnia.  The patient is on Ambien at home.   DISPOSITION:  The patient will undergo a 2D echocardiogram and follow up  with Dr. Daleen Squibb in [place] as needed.      Nicolasa Ducking, ANP  Electronically Signed      Jesse Sans. Daleen Squibb, MD, [facility]  Electronically Signed   CB/MedQ  DD: 03/27/2007  DT: 03/27/2007  Job #: [id number]

## 2011-07-02 LAB — POCT CARDIAC MARKERS
CKMB, poc: 1.5
Myoglobin, poc: 157

## 2011-07-02 LAB — BASIC METABOLIC PANEL
CO2: 27
Chloride: 104
Creatinine, Ser: 1.79 — ABNORMAL HIGH
GFR calc Af Amer: 34 — ABNORMAL LOW
Sodium: 136

## 2011-08-07 ENCOUNTER — Other Ambulatory Visit: Payer: Self-pay | Admitting: Family Medicine

## 2011-10-06 ENCOUNTER — Other Ambulatory Visit: Payer: Self-pay | Admitting: Family Medicine

## 2011-10-07 NOTE — Telephone Encounter (Signed)
Overdue for CPX.Marland Kitchen Please schedule.

## 2011-10-07 NOTE — Telephone Encounter (Signed)
Patient advised and will schedule appt. 

## 2011-12-05 ENCOUNTER — Other Ambulatory Visit: Payer: Self-pay | Admitting: Family Medicine

## 2012-01-21 LAB — BASIC METABOLIC PANEL: Creatinine: 0.3 mg/dL — AB (ref <=1.1)

## 2012-01-31 DIAGNOSIS — N182 Chronic kidney disease, stage 2 (mild): Secondary | ICD-10-CM | POA: Diagnosis not present

## 2012-01-31 DIAGNOSIS — I10 Essential (primary) hypertension: Secondary | ICD-10-CM | POA: Diagnosis not present

## 2012-01-31 DIAGNOSIS — N2581 Secondary hyperparathyroidism of renal origin: Secondary | ICD-10-CM | POA: Diagnosis not present

## 2012-02-11 ENCOUNTER — Encounter: Payer: Self-pay | Admitting: Family Medicine

## 2012-02-11 ENCOUNTER — Telehealth: Payer: Self-pay

## 2012-02-11 ENCOUNTER — Ambulatory Visit (INDEPENDENT_AMBULATORY_CARE_PROVIDER_SITE_OTHER): Payer: Medicare Other | Admitting: Family Medicine

## 2012-02-11 VITALS — BP 120/72 | HR 50 | Temp 98.0°F | Ht 60.5 in | Wt 161.0 lb

## 2012-02-11 DIAGNOSIS — Z0000 Encounter for general adult medical examination without abnormal findings: Secondary | ICD-10-CM | POA: Diagnosis not present

## 2012-02-11 DIAGNOSIS — Z1231 Encounter for screening mammogram for malignant neoplasm of breast: Secondary | ICD-10-CM

## 2012-02-11 DIAGNOSIS — Z780 Asymptomatic menopausal state: Secondary | ICD-10-CM

## 2012-02-11 DIAGNOSIS — I10 Essential (primary) hypertension: Secondary | ICD-10-CM

## 2012-02-11 DIAGNOSIS — Z1211 Encounter for screening for malignant neoplasm of colon: Secondary | ICD-10-CM

## 2012-02-11 DIAGNOSIS — N183 Chronic kidney disease, stage 3 unspecified: Secondary | ICD-10-CM

## 2012-02-11 DIAGNOSIS — E785 Hyperlipidemia, unspecified: Secondary | ICD-10-CM

## 2012-02-11 NOTE — Assessment & Plan Note (Signed)
Well controlled. Continue current medication.  

## 2012-02-11 NOTE — Progress Notes (Signed)
Subjective:    Patient ID: Natasha Sawyer, female    DOB: 05-11-1941, 71 y.o.   MRN: [id number]  HPI I have personally reviewed the Medicare Annual Wellness questionnaire and have noted 1. The patient's medical and social history 2. Their use of alcohol, tobacco or illicit drugs 3. Their current medications and supplements 4. The patient's functional ability including ADL's, fall risks, home safety risks and hearing or visual             impairment. 5. Diet and physical activities 6. Evidence for depression or mood disorders The patients weight, height, BMI and visual acuity have been recorded in the chart I have made referrals, counseling and provided education to the patient based review of the above and I have provided the pt with a written personalized care plan for preventive services.  Noted nodule right upper abdomen, mildly sore: consistent with lipoma... Present for few years, no change in size.  Hypertension:  Well controlled on amlodipine, enalapril and toprol Using medication without problems or lightheadedness: None Chest pain with exertion:None, occ chest pain mild at rest, twinges lasting seconds, has slower heart rate... Has appt with cardiology on 5/31.  Has had some fatigue. Edema:None Short of breath:None Average home BPs: not checking Other issues:  Elevated Cholesterol: on vytorin, due for re-eval. Using medications without problems:None Muscle aches: None Diet compliance:Good Exercise:yes, curves 2-3 timesweekly Other complaints:  CKD: followed by nephrology. Stable, last creatinine 01/23/2012: 0.9  Nonfasting CBG 129.    Review of Systems  Constitutional: Positive for fatigue. Negative for fever and unexpected weight change.  HENT: Negative for ear pain, congestion, sore throat, sneezing, trouble swallowing and sinus pressure.   Eyes: Negative for pain and itching.  Respiratory: Negative for cough, shortness of breath and wheezing.   Cardiovascular:  Positive for chest pain. Negative for palpitations and leg swelling.  Gastrointestinal: Negative for nausea, abdominal pain, diarrhea, constipation and blood in stool.  Genitourinary: Negative for dysuria, hematuria, vaginal discharge, difficulty urinating and menstrual problem.  Skin: Negative for rash.  Neurological: Negative for syncope, weakness, light-headedness, numbness and headaches.  Psychiatric/Behavioral: Negative for confusion and dysphoric mood. The patient is not nervous/anxious.        Occ short spells of sadness, but she snaps out of it.       Objective:   Physical Exam  Constitutional: Vital signs are normal. She appears well-developed and well-nourished. She is cooperative.  Non-toxic appearance. She does not appear ill. No distress.  HENT:  Head: Normocephalic.  Right Ear: Hearing, tympanic membrane, external ear and ear canal normal.  Left Ear: Hearing, tympanic membrane, external ear and ear canal normal.  Nose: Nose normal.  Eyes: Conjunctivae, EOM and lids are normal. Pupils are equal, round, and reactive to light. No foreign bodies found.  Neck: Trachea normal and normal range of motion. Neck supple. Carotid bruit is not present. No mass and no thyromegaly present.  Cardiovascular: Normal rate, regular rhythm, S1 normal, S2 normal, normal heart sounds and intact distal pulses.  Exam reveals no gallop.   No murmur heard. Pulmonary/Chest: Effort normal and breath sounds normal. No respiratory distress. She has no wheezes. She has no rhonchi. She has no rales.  Abdominal: Soft. Normal appearance and bowel sounds are normal. She exhibits no distension, no fluid wave, no abdominal bruit and no mass. There is no hepatosplenomegaly. There is no tenderness. There is no rebound, no guarding and no CVA tenderness. No hernia.  Genitourinary: No breast swelling, tenderness, discharge  or bleeding. Pelvic exam was performed with patient prone.  Lymphadenopathy:    She has no  cervical adenopathy.    She has no axillary adenopathy.  Neurological: She is alert. She has normal strength. No cranial nerve deficit or sensory deficit.  Skin: Skin is warm, dry and intact. No rash noted.  Psychiatric: Her speech is normal and behavior is normal. Judgment normal. Her mood appears not anxious. Cognition and memory are normal. She does not exhibit a depressed mood.          Assessment & Plan:  The patient's preventative maintenance and recommended screening tests for an annual wellness exam were reviewed in full today. Brought up to date unless services declined.  Counselled on the importance of diet, exercise, and its role in overall health and mortality. The patient's FH and SH was reviewed, including their home life, tobacco status, and drug and alcohol status.   Mammo: Due.  PAP/DVE:  Previously done with Dr. Barnabas Lister, wants done here now,last pap 2012 nml, okay with stopping these., asymptomatic, no family history of ovarina or uterine cancer: WILL STOP. DEXA: vit D nml, none Colon: last done many years ago: nml, will plan to do stool cards. Vaccines: U[ptodate with PNA, vaccine in last year, Tdap 2012, shingles consider Nonsmoker

## 2012-02-11 NOTE — Patient Instructions (Addendum)
Return for fasting labs for cholesterol and CMET.  Stop at front desk to schedule  Referrals.  Stop by lab to get stool cards on your way out. Call insurance about coverage for shingles vaccine.

## 2012-02-11 NOTE — Telephone Encounter (Signed)
Pt left v/m had question about labs on 02/12/12. Left v/m for pt to call back.

## 2012-02-12 ENCOUNTER — Other Ambulatory Visit: Payer: Self-pay | Admitting: Family Medicine

## 2012-02-12 ENCOUNTER — Other Ambulatory Visit (INDEPENDENT_AMBULATORY_CARE_PROVIDER_SITE_OTHER): Payer: Medicare Other

## 2012-02-12 DIAGNOSIS — E785 Hyperlipidemia, unspecified: Secondary | ICD-10-CM | POA: Diagnosis not present

## 2012-02-12 LAB — COMPREHENSIVE METABOLIC PANEL
ALT: 19 U/L (ref 0–35)
CO2: 25 mEq/L (ref 19–32)
Calcium: 9.2 mg/dL (ref 8.4–10.5)
Chloride: 109 mEq/L (ref 96–112)
Creatinine, Ser: 0.9 mg/dL (ref 0.4–1.2)
GFR: 65.94 mL/min (ref >60.00)
Glucose, Bld: 91 mg/dL (ref 70–99)
Total Bilirubin: 0.4 mg/dL (ref 0.3–1.2)

## 2012-02-12 LAB — LIPID PANEL
Cholesterol: 158 mg/dL (ref 0–200)
HDL: 72.6 mg/dL (ref >39.00)
Total CHOL/HDL Ratio: 2
Triglycerides: 120 mg/dL (ref 0.0–149.0)

## 2012-02-12 NOTE — Telephone Encounter (Signed)
Pt is needing RX for Shingles her insurance does cover it and she is wanting to get vaccine and [employer].

## 2012-02-13 MED ORDER — ZOSTER VACCINE LIVE 19400 UNT/0.65ML ~~LOC~~ SOLR: 0.6500 mL | Freq: Once | SUBCUTANEOUS | Status: DC

## 2012-02-13 NOTE — Telephone Encounter (Signed)
Okay to write shingles prescription, have Dr. Salena Saner complete if needed before Fri.

## 2012-02-13 NOTE — Telephone Encounter (Signed)
Rx for zostavax sent to [facility], patient advised via telephone.

## 2012-02-14 ENCOUNTER — Encounter: Payer: Self-pay | Admitting: Cardiovascular Disease

## 2012-02-14 ENCOUNTER — Ambulatory Visit (INDEPENDENT_AMBULATORY_CARE_PROVIDER_SITE_OTHER): Payer: Medicare Other | Admitting: Cardiovascular Disease

## 2012-02-14 VITALS — BP 142/90 | HR 54 | Ht 60.0 in | Wt 160.0 lb

## 2012-02-14 DIAGNOSIS — R0609 Other forms of dyspnea: Secondary | ICD-10-CM

## 2012-02-14 DIAGNOSIS — R0989 Other specified symptoms and signs involving the circulatory and respiratory systems: Secondary | ICD-10-CM

## 2012-02-14 DIAGNOSIS — I498 Other specified cardiac arrhythmias: Secondary | ICD-10-CM | POA: Diagnosis not present

## 2012-02-14 DIAGNOSIS — R001 Bradycardia, unspecified: Secondary | ICD-10-CM | POA: Insufficient documentation

## 2012-02-14 DIAGNOSIS — R0600 Dyspnea, unspecified: Secondary | ICD-10-CM | POA: Insufficient documentation

## 2012-02-14 DIAGNOSIS — R079 Chest pain, unspecified: Secondary | ICD-10-CM | POA: Diagnosis not present

## 2012-02-14 NOTE — Assessment & Plan Note (Signed)
Her chest pain is overall atypical and seems to be musculoskeletal. However, she does have risk factors for coronary artery disease and thus I will obtain a treadmill stress test. This will also help evaluate her heart rate response to exercise.

## 2012-02-14 NOTE — Progress Notes (Signed)
HPI  This is a 71 year old female who was referred by Dr. Cherylann Ratel for evaluation of bradycardia. She reports prolonged history of palpitations and premature beats and for that reason she has been on metoprolol. She was noted to be bradycardic recently. She reports that her lowest noted heart rate was 48 beats per minute. She denies dizziness, syncope or presyncope but she does feel tired and fatigue. She had few episodes of twinges in her chest lasting for a few seconds mostly at rest and not with physical activities. She has not had any exertional chest pain. She has mild exertional dyspnea. She has history of hypertension and hyperlipidemia as well as chronic kidney disease which has been stable.  No Known Allergies   Current Outpatient Prescriptions on File Prior to Visit  Medication Sig Dispense Refill  . amLODipine (NORVASC) 5 MG tablet Take 1 tablet by mouth daily.      . cholecalciferol (VITAMIN D) 1000 UNITS tablet Take 1,000 Units by mouth daily.      . enalapril (VASOTEC) 5 MG tablet Take 1 tablet by mouth daily.      Marland Kitchen ezetimibe-simvastatin (VYTORIN) 10-20 MG per tablet Take 1 tablet by mouth at bedtime.      . metoprolol succinate (TOPROL-XL) 25 MG 24 hr tablet take 1 tablet by mouth once daily  30 tablet  1     Past Medical History  Diagnosis Date  . Hyperlipidemia   . Hypertension   . Premature beats   . Chronic kidney disease      Past Surgical History  Procedure Date  . Hernia repair   . Cesarean section   . Appendectomy   . Eye surgery     cataract and eyelid     Family History  Problem Relation Age of Onset  . Diabetes Mother   . Stroke Father      History   Social History  . Marital Status: Widowed    Spouse Name: N/A    Number of Children: N/A  . Years of Education: N/A   Occupational History  . Not on file.   Social History Main Topics  . Smoking status: Never Smoker   . Smokeless tobacco: Not on file  . Alcohol Use: No  . Drug Use:  No  . Sexually Active: Not on file   Other Topics Concern  . Not on file   Social History Narrative   Lives at [facility] living will, full code     ROS Constitutional: Negative for fever, chills, diaphoresis, activity change, appetite change and fatigue.  HENT: Negative for hearing loss, nosebleeds, congestion, sore throat, facial swelling, drooling, trouble swallowing, neck pain, voice change, sinus pressure and tinnitus.  Eyes: Negative for photophobia, pain, discharge and visual disturbance.  Respiratory: Negative for apnea, cough and wheezing.  Cardiovascular: Negative for  leg swelling.  Gastrointestinal: Negative for nausea, vomiting, abdominal pain, diarrhea, constipation, blood in stool and abdominal distention.  Genitourinary: Negative for dysuria, urgency, frequency, hematuria and decreased urine volume.  Musculoskeletal: Negative for myalgias, back pain, joint swelling, arthralgias and gait problem.  Skin: Negative for color change, pallor, rash and wound.  Neurological: Negative for dizziness, tremors, seizures, syncope, speech difficulty, weakness, light-headedness, numbness and headaches.  Psychiatric/Behavioral: Negative for suicidal ideas, hallucinations, behavioral problems and agitation. The patient is not nervous/anxious.     PHYSICAL EXAM   BP 142/90  Pulse 54  Ht 5' (1.524 m)  Wt 160 lb (72.576 kg)  BMI 31.25  kg/m2 Constitutional: She is oriented to person, place, and time. She appears well-developed and well-nourished. No distress.  HENT: No nasal discharge.  Head: Normocephalic and atraumatic.  Eyes: Pupils are equal and round. Right eye exhibits no discharge. Left eye exhibits no discharge.  Neck: Normal range of motion. Neck supple. No JVD present. No thyromegaly present.  Cardiovascular: Normal rate, regular rhythm, normal heart sounds. Exam reveals no gallop and no friction rub. No murmur heard.  Pulmonary/Chest: Effort normal and breath sounds  normal. No stridor. No respiratory distress. She has no wheezes. She has no rales. She exhibits no tenderness.  Abdominal: Soft. Bowel sounds are normal. She exhibits no distension. There is no tenderness. There is no rebound and no guarding.  Musculoskeletal: Normal range of motion. She exhibits no edema and no tenderness.  Neurological: She is alert and oriented to person, place, and time. Coordination normal.  Skin: Skin is warm and dry. No rash noted. She is not diaphoretic. No erythema. No pallor.  Psychiatric: She has a normal mood and affect. Her behavior is normal. Judgment and thought content normal.     EKG: Sinus  Bradycardia  -With sinus arrhythmia. WITHIN NORMAL LIMITS   ASSESSMENT AND PLAN

## 2012-02-14 NOTE — Assessment & Plan Note (Signed)
She has mild bradycardia which seems to be overall asymptomatic except for fatigue which is nonspecific. Bradycardia is likely due to treatment with metoprolol. She has been on this for treatment of premature beats and previous episodes of tachycardia. Thus, I am concerned that if we stop this medication she would develop tachyarrhythmia. I will obtain a 48-hour Holter monitor to help determine if there is any to change her current treatment. I would be inclined most likely to keep her on a small dose beta blocker if her Holter monitor does not show significant bradycardia.

## 2012-02-14 NOTE — Patient Instructions (Signed)
Your physician has requested that you have an echocardiogram. Echocardiography is a painless test that uses sound waves to create images of your heart. It provides your doctor with information about the size and shape of your heart and how well your heart's chambers and valves are working. This procedure takes approximately one hour. There are no restrictions for this procedure.  Your physician has requested that you have an exercise tolerance test. For further information please visit [email or web address] Please also follow instruction sheet, as given.  Do not take Metoprolol before the stress test.   Your physician has recommended that you wear a holter monitor. Holter monitors are medical devices that record the heart's electrical activity. Doctors most often use these monitors to diagnose arrhythmias. Arrhythmias are problems with the speed or rhythm of the heartbeat. The monitor is a small, portable device. You can wear one while you do your normal daily activities. This is usually used to diagnose what is causing palpitations/syncope (passing out).  Follow up after tests.

## 2012-02-14 NOTE — Assessment & Plan Note (Signed)
She has mild exertional dyspnea with history of hypertension and chronic kidney disease. I will obtain an echocardiogram to evaluate LV systolic and diastolic function.

## 2012-02-17 NOTE — Telephone Encounter (Signed)
Pt said already had lab and taken care of question.

## 2012-02-18 ENCOUNTER — Encounter: Payer: Self-pay | Admitting: Family Medicine

## 2012-02-18 ENCOUNTER — Other Ambulatory Visit: Payer: Medicare Other

## 2012-02-18 DIAGNOSIS — Z1211 Encounter for screening for malignant neoplasm of colon: Secondary | ICD-10-CM

## 2012-02-18 LAB — FECAL OCCULT BLOOD, IMMUNOCHEMICAL: Fecal Occult Bld: NEGATIVE

## 2012-02-19 ENCOUNTER — Encounter: Payer: Self-pay | Admitting: Family Medicine

## 2012-02-19 DIAGNOSIS — Z13820 Encounter for screening for osteoporosis: Secondary | ICD-10-CM | POA: Diagnosis not present

## 2012-02-19 DIAGNOSIS — Z780 Asymptomatic menopausal state: Secondary | ICD-10-CM | POA: Diagnosis not present

## 2012-02-19 DIAGNOSIS — M948X9 Other specified disorders of cartilage, unspecified sites: Secondary | ICD-10-CM | POA: Diagnosis not present

## 2012-02-19 DIAGNOSIS — N951 Menopausal and female climacteric states: Secondary | ICD-10-CM | POA: Diagnosis not present

## 2012-02-19 DIAGNOSIS — Z1231 Encounter for screening mammogram for malignant neoplasm of breast: Secondary | ICD-10-CM | POA: Diagnosis not present

## 2012-02-19 DIAGNOSIS — E2839 Other primary ovarian failure: Secondary | ICD-10-CM | POA: Diagnosis not present

## 2012-02-20 ENCOUNTER — Encounter: Payer: Self-pay | Admitting: Family Medicine

## 2012-02-21 ENCOUNTER — Encounter: Payer: Self-pay | Admitting: *Deleted

## 2012-03-02 ENCOUNTER — Ambulatory Visit (INDEPENDENT_AMBULATORY_CARE_PROVIDER_SITE_OTHER): Payer: Medicare Other | Admitting: Cardiovascular Disease

## 2012-03-02 ENCOUNTER — Encounter: Payer: Self-pay | Admitting: Cardiovascular Disease

## 2012-03-02 ENCOUNTER — Other Ambulatory Visit: Payer: Self-pay | Admitting: Family Medicine

## 2012-03-02 VITALS — BP 100/72 | HR 60 | Ht 60.0 in | Wt 160.0 lb

## 2012-03-02 DIAGNOSIS — R079 Chest pain, unspecified: Secondary | ICD-10-CM

## 2012-03-02 NOTE — Patient Instructions (Addendum)
Your stress test was normal.  Follow up after the echo as planned.

## 2012-03-02 NOTE — Procedures (Signed)
    Treadmill Stress test  Indication: Atypical chest pain.  Baseline Data:  Resting EKG shows NSR with rate of 57 bpm, no significant ST or T wave changes Resting blood pressure of 100/72 mm Hg Stand bruce protocal was used.  Exercise Data:  Patient exercised for 5 min 0 sec,  Peak heart rate of 140 bpm.  This was 90 % of the maximum predicted heart rate. No symptoms of chest pain or lightheadedness were reported at peak stress or in recovery.  Peak Blood pressure recorded was 140/80 Maximal work level: 7 METs.  Heart rate at 3 minutes in recovery was 106 bpm. BP response: Normal HR response: Normal  EKG with Exercise: Sinus tachycardia with no significant ST or T wave changes. Frequent PACs noted with stress and in recovery.  FINAL IMPRESSION: Normal exercise stress test. No significant EKG changes concerning for ischemia. Fair exercise tolerance. Normal heart rate response to exercise. Frequent PACs noted during exercise and in recovery.

## 2012-03-02 NOTE — Telephone Encounter (Signed)
[facility] [address] request metoprolol 25 mg 24 hr tab #30 x 11.

## 2012-03-03 ENCOUNTER — Ambulatory Visit: Payer: Self-pay | Admitting: Cardiovascular Disease

## 2012-03-03 ENCOUNTER — Other Ambulatory Visit: Payer: Self-pay | Admitting: Cardiovascular Disease

## 2012-03-03 DIAGNOSIS — R001 Bradycardia, unspecified: Secondary | ICD-10-CM

## 2012-03-09 ENCOUNTER — Other Ambulatory Visit: Payer: Medicare Other

## 2012-03-16 ENCOUNTER — Ambulatory Visit: Payer: Medicare Other | Admitting: Cardiovascular Disease

## 2012-03-17 ENCOUNTER — Other Ambulatory Visit (INDEPENDENT_AMBULATORY_CARE_PROVIDER_SITE_OTHER): Payer: Medicare Other

## 2012-03-17 ENCOUNTER — Other Ambulatory Visit: Payer: Self-pay

## 2012-03-17 DIAGNOSIS — R0600 Dyspnea, unspecified: Secondary | ICD-10-CM

## 2012-03-17 DIAGNOSIS — R079 Chest pain, unspecified: Secondary | ICD-10-CM

## 2012-03-17 DIAGNOSIS — R0609 Other forms of dyspnea: Secondary | ICD-10-CM

## 2012-03-17 DIAGNOSIS — R0989 Other specified symptoms and signs involving the circulatory and respiratory systems: Secondary | ICD-10-CM | POA: Diagnosis not present

## 2012-03-20 ENCOUNTER — Ambulatory Visit (INDEPENDENT_AMBULATORY_CARE_PROVIDER_SITE_OTHER): Payer: Medicare Other | Admitting: Cardiovascular Disease

## 2012-03-20 ENCOUNTER — Encounter: Payer: Self-pay | Admitting: Cardiovascular Disease

## 2012-03-20 VITALS — BP 132/82 | HR 54 | Ht 65.5 in | Wt 162.0 lb

## 2012-03-20 DIAGNOSIS — R079 Chest pain, unspecified: Secondary | ICD-10-CM

## 2012-03-20 DIAGNOSIS — R0989 Other specified symptoms and signs involving the circulatory and respiratory systems: Secondary | ICD-10-CM | POA: Diagnosis not present

## 2012-03-20 DIAGNOSIS — R0609 Other forms of dyspnea: Secondary | ICD-10-CM

## 2012-03-20 DIAGNOSIS — I498 Other specified cardiac arrhythmias: Secondary | ICD-10-CM | POA: Diagnosis not present

## 2012-03-20 DIAGNOSIS — R0600 Dyspnea, unspecified: Secondary | ICD-10-CM

## 2012-03-20 DIAGNOSIS — R001 Bradycardia, unspecified: Secondary | ICD-10-CM

## 2012-03-20 NOTE — Assessment & Plan Note (Signed)
Atypical and does not seem to be cardiac. Treadmill stress test showed no evidence of ischemia.

## 2012-03-20 NOTE — Progress Notes (Signed)
HPI  This is a 71 year old female who is here today for followup regarding bradycardia. She reports prolonged history of palpitations and premature beats and for that reason she has been on metoprolol. She was noted to be bradycardic recently. She reports that her lowest noted heart rate was 48 beats per minute. She denies dizziness, syncope or presyncope but she does feel tired and fatigue.  She had few episodes of twinges in her chest lasting for a few seconds mostly at rest and not with physical activities. She has not had any exertional chest pain. She has mild exertional dyspnea.  She has history of hypertension and hyperlipidemia as well as very mild chronic kidney disease which has been stable. She reports previous episodes of tachycardia in the past but not recently.   No Known Allergies   Current Outpatient Prescriptions on File Prior to Visit  Medication Sig Dispense Refill  . amLODipine (NORVASC) 5 MG tablet Take 1 tablet by mouth daily.      . cholecalciferol (VITAMIN D) 1000 UNITS tablet Take 1,000 Units by mouth daily.      . enalapril (VASOTEC) 5 MG tablet Take 1 tablet by mouth daily.      Marland Kitchen ezetimibe-simvastatin (VYTORIN) 10-20 MG per tablet Take 1 tablet by mouth at bedtime.      . metoprolol succinate (TOPROL-XL) 25 MG 24 hr tablet take 1 tablet by mouth once daily  30 tablet  11  . zolpidem (AMBIEN) 5 MG tablet Take 5 mg by mouth at bedtime as needed.         Past Medical History  Diagnosis Date  . Hyperlipidemia   . Hypertension   . Premature beats   . Chronic kidney disease      Past Surgical History  Procedure Date  . Hernia repair   . Cesarean section   . Appendectomy   . Eye surgery     cataract and eyelid     Family History  Problem Relation Age of Onset  . Diabetes Mother   . Stroke Father      History   Social History  . Marital Status: Widowed    Spouse Name: N/A    Number of Children: N/A  . Years of Education: N/A    Occupational History  . Not on file.   Social History Main Topics  . Smoking status: Never Smoker   . Smokeless tobacco: Not on file  . Alcohol Use: No  . Drug Use: No  . Sexually Active: Not on file   Other Topics Concern  . Not on file   Social History Narrative   Lives at [facility] living will, full code      PHYSICAL EXAM   BP 132/82  Pulse 54  Ht 5' 5.5" (1.664 m)  Wt 162 lb (73.483 kg)  BMI 26.55 kg/m2  Constitutional: She is oriented to person, place, and time. She appears well-developed and well-nourished. No distress.  HENT: No nasal discharge.  Head: Normocephalic and atraumatic.  Eyes: Pupils are equal and round. Right eye exhibits no discharge. Left eye exhibits no discharge.  Neck: Normal range of motion. Neck supple. No JVD present. No thyromegaly present.  Cardiovascular: Normal rate, regular rhythm, normal heart sounds. Exam reveals no gallop and no friction rub. No murmur heard.  Pulmonary/Chest: Effort normal and breath sounds normal. No stridor. No respiratory distress. She has no wheezes. She has no rales. She exhibits no tenderness.  Abdominal: Soft. Bowel sounds are  normal. She exhibits no distension. There is no tenderness. There is no rebound and no guarding.  Musculoskeletal: Normal range of motion. She exhibits no edema and no tenderness.  Neurological: She is alert and oriented to person, place, and time. Coordination normal.  Skin: Skin is warm and dry. No rash noted. She is not diaphoretic. No erythema. No pallor.  Psychiatric: She has a normal mood and affect. Her behavior is normal. Judgment and thought content normal.      ASSESSMENT AND PLAN

## 2012-03-20 NOTE — Assessment & Plan Note (Signed)
She had a Holter monitor which showed that the lowest heart rate was 42 beats per minute. No evidence of high-grade AV block or sinus pauses. She was noted to have frequent PACs. She also reports previous episodes of tachycardia. Due to that, I'm hesitant to stop current dose of metoprolol even though she is mildly bradycardic. I think she can be monitored for now. If she develops any documented tachyarrhythmia requiring increased dose of metoprolol, she might ultimately require permanent pacemaker placement in order to allow that.

## 2012-03-20 NOTE — Patient Instructions (Addendum)
Your heart tests were fine.  Continue same medications.  Follow up in 6 months.

## 2012-03-20 NOTE — Assessment & Plan Note (Signed)
Echocardiogram showed normal LV systolic function without significant valvular abnormalities or pulmonary hypertension.

## 2012-07-14 DIAGNOSIS — Z23 Encounter for immunization: Secondary | ICD-10-CM | POA: Diagnosis not present

## 2012-08-20 DIAGNOSIS — I10 Essential (primary) hypertension: Secondary | ICD-10-CM | POA: Diagnosis not present

## 2012-08-20 DIAGNOSIS — R809 Proteinuria, unspecified: Secondary | ICD-10-CM | POA: Diagnosis not present

## 2012-08-20 DIAGNOSIS — N2581 Secondary hyperparathyroidism of renal origin: Secondary | ICD-10-CM | POA: Diagnosis not present

## 2012-09-18 ENCOUNTER — Encounter: Payer: Self-pay | Admitting: Cardiovascular Disease

## 2012-09-18 ENCOUNTER — Ambulatory Visit (INDEPENDENT_AMBULATORY_CARE_PROVIDER_SITE_OTHER): Payer: Medicare Other | Admitting: Cardiovascular Disease

## 2012-09-18 VITALS — BP 124/82 | HR 58 | Ht 60.0 in | Wt 164.5 lb

## 2012-09-18 DIAGNOSIS — I10 Essential (primary) hypertension: Secondary | ICD-10-CM

## 2012-09-18 DIAGNOSIS — I498 Other specified cardiac arrhythmias: Secondary | ICD-10-CM | POA: Diagnosis not present

## 2012-09-18 DIAGNOSIS — R001 Bradycardia, unspecified: Secondary | ICD-10-CM

## 2012-09-18 DIAGNOSIS — R079 Chest pain, unspecified: Secondary | ICD-10-CM | POA: Diagnosis not present

## 2012-09-18 NOTE — Patient Instructions (Addendum)
Continue same medications  Follow up in 1 year

## 2012-09-18 NOTE — Assessment & Plan Note (Signed)
No further complaints. Treadmill stress test was normal.

## 2012-09-18 NOTE — Assessment & Plan Note (Addendum)
She is asymptomatic. No changes will be made today. Continue treatment with metoprolol due to chronic palpitations.

## 2012-09-18 NOTE — Progress Notes (Signed)
HPI  This is a 72 year old female who is here today for followup regarding bradycardia, palpitations and atypical chest pain. She reports prolonged history of palpitations and premature beats and for that reason she has been on metoprolol. She was noted to be bradycardic on metoprolol but I elected to continue the same dose of medication due to her symptoms of palpitations and premature beats.  She had atypical chest pain which was evaluated by a treadmill stress test which was normal. Echocardiogram showed no significant structural abnormalities. Overall she has been doing well and denies any chest pain, dyspnea or palpitations.  No Known Allergies   Current Outpatient Prescriptions on File Prior to Visit  Medication Sig Dispense Refill  . amLODipine (NORVASC) 5 MG tablet Take 1 tablet by mouth daily.      . cholecalciferol (VITAMIN D) 1000 UNITS tablet Take 1,000 Units by mouth daily.      . enalapril (VASOTEC) 5 MG tablet Take 1 tablet by mouth daily.      Marland Kitchen ezetimibe-simvastatin (VYTORIN) 10-20 MG per tablet Take 1 tablet by mouth at bedtime.      . metoprolol succinate (TOPROL-XL) 25 MG 24 hr tablet take 1 tablet by mouth once daily  30 tablet  11  . zolpidem (AMBIEN) 5 MG tablet Take 5 mg by mouth at bedtime as needed.         Past Medical History  Diagnosis Date  . Hyperlipidemia   . Hypertension   . Premature beats   . Chronic kidney disease      Past Surgical History  Procedure Date  . Hernia repair   . Cesarean section   . Appendectomy   . Eye surgery     cataract and eyelid     Family History  Problem Relation Age of Onset  . Diabetes Mother   . Stroke Father      History   Social History  . Marital Status: Widowed    Spouse Name: N/A    Number of Children: N/A  . Years of Education: N/A   Occupational History  . Not on file.   Social History Main Topics  . Smoking status: Never Smoker   . Smokeless tobacco: Not on file  . Alcohol Use: No  .  Drug Use: No  . Sexually Active: Not on file   Other Topics Concern  . Not on file   Social History Narrative   Lives at [facility] living will, full code      PHYSICAL EXAM   BP 124/82  Pulse 58  Ht 5' (1.524 m)  Wt 164 lb 8 oz (74.617 kg)  BMI 32.13 kg/m2  Constitutional: She is oriented to person, place, and time. She appears well-developed and well-nourished. No distress.  HENT: No nasal discharge.  Head: Normocephalic and atraumatic.  Eyes: Pupils are equal and round. Right eye exhibits no discharge. Left eye exhibits no discharge.  Neck: Normal range of motion. Neck supple. No JVD present. No thyromegaly present.  Cardiovascular: Normal rate, regular rhythm, normal heart sounds. Exam reveals no gallop and no friction rub. No murmur heard.  Pulmonary/Chest: Effort normal and breath sounds normal. No stridor. No respiratory distress. She has no wheezes. She has no rales. She exhibits no tenderness.  Abdominal: Soft. Bowel sounds are normal. She exhibits no distension. There is no tenderness. There is no rebound and no guarding.  Musculoskeletal: Normal range of motion. She exhibits no edema and no tenderness.  Neurological: She  is alert and oriented to person, place, and time. Coordination normal.  Skin: Skin is warm and dry. No rash noted. She is not diaphoretic. No erythema. No pallor.  Psychiatric: She has a normal mood and affect. Her behavior is normal. Judgment and thought content normal.    EKG: Sinus  Bradycardia  WITHIN NORMAL LIMITS  ASSESSMENT AND PLAN

## 2012-12-07 DIAGNOSIS — N2581 Secondary hyperparathyroidism of renal origin: Secondary | ICD-10-CM | POA: Diagnosis not present

## 2012-12-07 DIAGNOSIS — I10 Essential (primary) hypertension: Secondary | ICD-10-CM | POA: Diagnosis not present

## 2012-12-07 DIAGNOSIS — R809 Proteinuria, unspecified: Secondary | ICD-10-CM | POA: Diagnosis not present

## 2013-01-25 DIAGNOSIS — H2510 Age-related nuclear cataract, unspecified eye: Secondary | ICD-10-CM | POA: Diagnosis not present

## 2013-01-25 DIAGNOSIS — Z961 Presence of intraocular lens: Secondary | ICD-10-CM | POA: Diagnosis not present

## 2013-03-22 ENCOUNTER — Other Ambulatory Visit: Payer: Self-pay | Admitting: Family Medicine

## 2013-03-29 DIAGNOSIS — I10 Essential (primary) hypertension: Secondary | ICD-10-CM | POA: Diagnosis not present

## 2013-03-29 DIAGNOSIS — N2581 Secondary hyperparathyroidism of renal origin: Secondary | ICD-10-CM | POA: Diagnosis not present

## 2013-03-29 DIAGNOSIS — R809 Proteinuria, unspecified: Secondary | ICD-10-CM | POA: Diagnosis not present

## 2013-04-02 ENCOUNTER — Ambulatory Visit: Payer: Medicare Other | Admitting: Family Medicine

## 2013-07-16 ENCOUNTER — Ambulatory Visit: Payer: Medicare Other | Admitting: Family Medicine

## 2013-07-16 ENCOUNTER — Encounter: Payer: Self-pay | Admitting: Family Medicine

## 2013-07-16 ENCOUNTER — Ambulatory Visit (INDEPENDENT_AMBULATORY_CARE_PROVIDER_SITE_OTHER): Payer: Medicare Other | Admitting: Family Medicine

## 2013-07-16 VITALS — BP 114/80 | HR 62 | Temp 98.0°F | Ht 60.5 in | Wt 162.5 lb

## 2013-07-16 DIAGNOSIS — J069 Acute upper respiratory infection, unspecified: Secondary | ICD-10-CM | POA: Diagnosis not present

## 2013-07-16 DIAGNOSIS — G5700 Lesion of sciatic nerve, unspecified lower limb: Secondary | ICD-10-CM

## 2013-07-16 DIAGNOSIS — H6123 Impacted cerumen, bilateral: Secondary | ICD-10-CM

## 2013-07-16 DIAGNOSIS — G5701 Lesion of sciatic nerve, right lower limb: Secondary | ICD-10-CM | POA: Insufficient documentation

## 2013-07-16 DIAGNOSIS — H6120 Impacted cerumen, unspecified ear: Secondary | ICD-10-CM | POA: Diagnosis not present

## 2013-07-16 MED ORDER — TRAMADOL HCL 50 MG PO TABS: 50.0000 mg | ORAL_TABLET | Freq: Three times a day (TID) | ORAL | Status: DC | PRN

## 2013-07-16 NOTE — Patient Instructions (Addendum)
Viral URI: use nasal saline irrigaiton/spray 2-3 times daiuly.  Mucinex DM as needed twice daily. Start tramadol for pain and inflammation. Heat and gentle stretching of low back and right piriformis muscle.  Follow up if not improving in 2 weeks.

## 2013-07-16 NOTE — Assessment & Plan Note (Signed)
Symptomatic care 

## 2013-07-16 NOTE — Progress Notes (Signed)
  Subjective:    Patient ID: Natasha Sawyer, female    DOB: 02-27-1941, 72 y.o.   MRN: [id number]  HPI 72 year old female presents with two issues.  1. Cough ongoing x 2 days; 1 week of laryngitis, hoarse voice.  Small amount of cough in last few days. Productive cough.  No SOB, no wheezing.  Fatigue. No fever, no chills.  No ear pain, no face pain.  Has not used anything except tylenol for symptoms.  2. right buttock pain: 3 days of severe hip pain. Improved some in last day.  She has been changing exercise plan... ahe was in class where she was doing squats. No falls. Mild low back pin right lower back as well. No radicular pain. No numbness or weakness.  Has hx of lumbar pain years ago. Disc issue. No surgeries.  Has CKD cannot use NSAIDs.   Review of Systems  Constitutional: Negative for fever and fatigue.  HENT: Negative for ear pain.   Eyes: Negative for pain.  Respiratory: Positive for cough. Negative for chest tightness and shortness of breath.   Cardiovascular: Negative for chest pain, palpitations and leg swelling.  Gastrointestinal: Negative for abdominal pain.  Genitourinary: Negative for dysuria.       Objective:   Physical Exam  Constitutional: Vital signs are normal. She appears well-developed and well-nourished. She is cooperative.  Non-toxic appearance. She does not appear ill. No distress.  HENT:  Head: Normocephalic.  Right Ear: Hearing, tympanic membrane, external ear and ear canal normal. Tympanic membrane is not erythematous, not retracted and not bulging.  Left Ear: Hearing, tympanic membrane, external ear and ear canal normal. Tympanic membrane is not erythematous, not retracted and not bulging.  Nose: No mucosal edema or rhinorrhea. Right sinus exhibits no maxillary sinus tenderness and no frontal sinus tenderness. Left sinus exhibits no maxillary sinus tenderness and no frontal sinus tenderness.  Mouth/Throat: Uvula is midline, oropharynx is clear  and moist and mucous membranes are normal.  Eyes: Conjunctivae, EOM and lids are normal. Pupils are equal, round, and reactive to light. Lids are everted and swept, no foreign bodies found.  Neck: Trachea normal and normal range of motion. Neck supple. Carotid bruit is not present. No mass and no thyromegaly present.  Cardiovascular: Normal rate, regular rhythm, S1 normal, S2 normal, normal heart sounds, intact distal pulses and normal pulses.  Exam reveals no gallop and no friction rub.   No murmur heard. Pulmonary/Chest: Effort normal and breath sounds normal. Not tachypneic. No respiratory distress. She has no decreased breath sounds. She has no wheezes. She has no rhonchi. She has no rales.  Abdominal: Soft. Normal appearance and bowel sounds are normal. There is no tenderness.  Musculoskeletal:  Pain inright biuttock on palpationa nd increased with faber's, neg SLR  Neurological: She is alert. She has normal strength. No sensory deficit. She exhibits normal muscle tone. Gait abnormal.  Slowed gait.  Skin: Skin is warm, dry and intact. No rash noted.  Psychiatric: Her speech is normal and behavior is normal. Judgment and thought content normal. Her mood appears not anxious. Cognition and memory are normal. She does not exhibit a depressed mood.          Assessment & Plan:

## 2013-07-16 NOTE — Assessment & Plan Note (Signed)
Vs sciatica. Start heat. Massage, gentle stretching . Info given. NSAIDs contraindication given CKD... Try instead tramadol prn pain.  Follow up if not improving in 2 weeks.

## 2013-07-21 DIAGNOSIS — Z23 Encounter for immunization: Secondary | ICD-10-CM | POA: Diagnosis not present

## 2013-09-20 DIAGNOSIS — N2581 Secondary hyperparathyroidism of renal origin: Secondary | ICD-10-CM | POA: Diagnosis not present

## 2013-09-20 DIAGNOSIS — N183 Chronic kidney disease, stage 3 unspecified: Secondary | ICD-10-CM | POA: Diagnosis not present

## 2013-09-20 DIAGNOSIS — I10 Essential (primary) hypertension: Secondary | ICD-10-CM | POA: Diagnosis not present

## 2013-09-20 DIAGNOSIS — R809 Proteinuria, unspecified: Secondary | ICD-10-CM | POA: Diagnosis not present

## 2013-09-27 DIAGNOSIS — I10 Essential (primary) hypertension: Secondary | ICD-10-CM | POA: Diagnosis not present

## 2013-09-27 DIAGNOSIS — R809 Proteinuria, unspecified: Secondary | ICD-10-CM | POA: Diagnosis not present

## 2013-09-27 DIAGNOSIS — N183 Chronic kidney disease, stage 3 unspecified: Secondary | ICD-10-CM | POA: Diagnosis not present

## 2013-09-27 DIAGNOSIS — N2581 Secondary hyperparathyroidism of renal origin: Secondary | ICD-10-CM | POA: Diagnosis not present

## 2014-02-04 DIAGNOSIS — H43819 Vitreous degeneration, unspecified eye: Secondary | ICD-10-CM | POA: Diagnosis not present

## 2014-03-21 DIAGNOSIS — N183 Chronic kidney disease, stage 3 unspecified: Secondary | ICD-10-CM | POA: Diagnosis not present

## 2014-03-21 DIAGNOSIS — N2581 Secondary hyperparathyroidism of renal origin: Secondary | ICD-10-CM | POA: Diagnosis not present

## 2014-03-21 DIAGNOSIS — I10 Essential (primary) hypertension: Secondary | ICD-10-CM | POA: Diagnosis not present

## 2014-03-21 DIAGNOSIS — R809 Proteinuria, unspecified: Secondary | ICD-10-CM | POA: Diagnosis not present

## 2014-03-28 DIAGNOSIS — I10 Essential (primary) hypertension: Secondary | ICD-10-CM | POA: Diagnosis not present

## 2014-03-28 DIAGNOSIS — N183 Chronic kidney disease, stage 3 unspecified: Secondary | ICD-10-CM | POA: Diagnosis not present

## 2014-03-28 DIAGNOSIS — R809 Proteinuria, unspecified: Secondary | ICD-10-CM | POA: Diagnosis not present

## 2014-03-28 DIAGNOSIS — N2581 Secondary hyperparathyroidism of renal origin: Secondary | ICD-10-CM | POA: Diagnosis not present

## 2014-04-08 ENCOUNTER — Ambulatory Visit (INDEPENDENT_AMBULATORY_CARE_PROVIDER_SITE_OTHER): Payer: Medicare Other | Admitting: Family Medicine

## 2014-04-08 ENCOUNTER — Ambulatory Visit: Payer: PRIVATE HEALTH INSURANCE | Admitting: Family Medicine

## 2014-04-08 ENCOUNTER — Encounter: Payer: Self-pay | Admitting: Family Medicine

## 2014-04-08 VITALS — BP 142/78 | HR 80 | Temp 98.0°F | Wt 162.5 lb

## 2014-04-08 DIAGNOSIS — M545 Low back pain, unspecified: Secondary | ICD-10-CM

## 2014-04-08 MED ORDER — TRAMADOL HCL 50 MG PO TABS: 50.0000 mg | ORAL_TABLET | Freq: Three times a day (TID) | ORAL | Status: DC | PRN

## 2014-04-08 MED ORDER — CYCLOBENZAPRINE HCL 10 MG PO TABS: 5.0000 mg | ORAL_TABLET | Freq: Three times a day (TID) | ORAL | Status: DC | PRN

## 2014-04-08 NOTE — Progress Notes (Signed)
Pre visit review using our clinic review tool, if applicable. No additional management support is needed unless otherwise documented below in the visit note.  Recent flare of back pain. Pain getting out of a chair.  Band of pain, just below the belt line.  Started a few weeks ago.  No known trauma.  No leg pain, no B/B sx.  No rash.  No bruising.  No pain before getting out of bed.  Less pain in a chair, but hurts getting up. As she is moving around, the pain gets a little better.  Tramadol helps some.  Hasn't tried ice or heat.  Can't take nsaids.  Tylenol didn't help much.   Meds, vitals, and allergies reviewed.   ROS: See HPI.  Otherwise, noncontributory.  nad ncat Mmm rrr ctab abd soft B lower back sore but not ttp Pain with extension not flexion at the waist SLR neg Distally nv intact, s/s wnl BLE

## 2014-04-08 NOTE — Patient Instructions (Signed)
Use flexeril for your back.  Take tramadol if needed.  Both can make you drowsy.  Gently stretch your back.  Notify Dr. Ermalene SearingBedsole if not better.  Take care.

## 2014-04-10 DIAGNOSIS — M549 Dorsalgia, unspecified: Secondary | ICD-10-CM | POA: Insufficient documentation

## 2014-04-10 NOTE — Assessment & Plan Note (Signed)
Likely benign strain.  No need to image now.  D/w pt. Use flexeril for and tramadol if needed.  Sedation caution.  Stretching d/w pt. Notify Dr. Ermalene SearingBedsole if not better.  She agrees.

## 2014-05-09 ENCOUNTER — Encounter: Payer: Self-pay | Admitting: Family Medicine

## 2014-05-09 ENCOUNTER — Ambulatory Visit (INDEPENDENT_AMBULATORY_CARE_PROVIDER_SITE_OTHER)
Admission: RE | Admit: 2014-05-09 | Discharge: 2014-05-09 | Disposition: A | Discharge status: Home / Self Care | Payer: Medicare Other | Source: Ambulatory Visit | Attending: Family Medicine | Admitting: Family Medicine

## 2014-05-09 ENCOUNTER — Ambulatory Visit (INDEPENDENT_AMBULATORY_CARE_PROVIDER_SITE_OTHER): Payer: Medicare Other | Admitting: Family Medicine

## 2014-05-09 VITALS — BP 130/82 | HR 56 | Temp 97.9°F | Ht 60.5 in | Wt 159.5 lb

## 2014-05-09 DIAGNOSIS — IMO0001 Reserved for inherently not codable concepts without codable children: Secondary | ICD-10-CM

## 2014-05-09 DIAGNOSIS — M7918 Myalgia, other site: Secondary | ICD-10-CM

## 2014-05-09 DIAGNOSIS — M1610 Unilateral primary osteoarthritis, unspecified hip: Secondary | ICD-10-CM | POA: Diagnosis not present

## 2014-05-09 DIAGNOSIS — M546 Pain in thoracic spine: Secondary | ICD-10-CM

## 2014-05-09 DIAGNOSIS — M47817 Spondylosis without myelopathy or radiculopathy, lumbosacral region: Secondary | ICD-10-CM | POA: Diagnosis not present

## 2014-05-09 DIAGNOSIS — M169 Osteoarthritis of hip, unspecified: Secondary | ICD-10-CM | POA: Diagnosis not present

## 2014-05-09 NOTE — Progress Notes (Signed)
Pre visit review using our clinic review tool, if applicable. No additional management support is needed unless otherwise documented below in the visit note. 

## 2014-05-09 NOTE — Patient Instructions (Signed)
Stop at X-ray on way out.  Continue tramadol for now.

## 2014-05-09 NOTE — Progress Notes (Signed)
Subjective:    Patient ID: Natasha Sawyer, female    DOB: 02/18/1941, 72 y.o.   MRN: [id number]  Hip Pain     73 year old female presents for perisistent onset right sided hip pain. She was seen by Dr. Para March on 7/24 for low ack pain.  He diagnosed low back strain and recommended flexeril an tramadol prn pain. Has CKD cannot use NSAIDs.  Tylenol didn't help much   She reports pain is different, has moved. She did not use muscle relaxant.   Pain is more in right buttock. No pain radiating down leg. No weakness or numbness. Pain is greatest after getting out of a chair.  No change in activity but just got back from vacation ( had no pain when she was not in usual activity pattern).  She was doing a lot of walking on vacation. No fall, no injury.   She has a history of  low back pain and left sided hip pain  as well as history of piriformis syndrome on right in 2009. No surgeries.    Review of Systems  Constitutional: Negative for fever and fatigue.  HENT: Negative for ear pain.   Eyes: Negative for pain.  Respiratory: Negative for chest tightness and shortness of breath.   Cardiovascular: Negative for chest pain, palpitations and leg swelling.  Gastrointestinal: Negative for abdominal pain.  Genitourinary: Negative for dysuria.       Objective:   Physical Exam  Constitutional: Vital signs are normal. She appears well-developed and well-nourished. She is cooperative.  Non-toxic appearance. She does not appear ill. No distress.  HENT:  Head: Normocephalic.  Right Ear: Hearing, tympanic membrane, external ear and ear canal normal. Tympanic membrane is not erythematous, not retracted and not bulging.  Left Ear: Hearing, tympanic membrane, external ear and ear canal normal. Tympanic membrane is not erythematous, not retracted and not bulging.  Nose: No mucosal edema or rhinorrhea. Right sinus exhibits no maxillary sinus tenderness and no frontal sinus tenderness. Left sinus exhibits  no maxillary sinus tenderness and no frontal sinus tenderness.  Mouth/Throat: Uvula is midline, oropharynx is clear and moist and mucous membranes are normal.  Eyes: Conjunctivae, EOM and lids are normal. Pupils are equal, round, and reactive to light. Lids are everted and swept, no foreign bodies found.  Neck: Trachea normal and normal range of motion. Neck supple. Carotid bruit is not present. No mass and no thyromegaly present.  Cardiovascular: Normal rate, regular rhythm, S1 normal, S2 normal, normal heart sounds, intact distal pulses and normal pulses.  Exam reveals no gallop and no friction rub.   No murmur heard. Pulmonary/Chest: Effort normal and breath sounds normal. Not tachypneic. No respiratory distress. She has no decreased breath sounds. She has no wheezes. She has no rhonchi. She has no rales.  Abdominal: Soft. Normal appearance and bowel sounds are normal. There is no tenderness.  Musculoskeletal:       Right hip: She exhibits normal range of motion, normal strength, no tenderness, no bony tenderness and no swelling.       Lumbar back: She exhibits tenderness. She exhibits normal range of motion and no bony tenderness.  Pain in right buttock on palpation and increased with faber's, neg SLR Full ROM in right an left hip, no ttp over lateral hip at bursa or anterior hip joint  Neurological: She is alert. She has normal strength. No sensory deficit. She exhibits normal muscle tone. Gait abnormal.  Slowed gait.  Skin: Skin is  warm, dry and intact. No rash noted.  Psychiatric: Her speech is normal and behavior is normal. Judgment and thought content normal. Her mood appears not anxious. Cognition and memory are normal. She does not exhibit a depressed mood.          Assessment & Plan:  Most likely not hip pain and instead referred pain from back or piriformis syndrome.  Will eval with X-ray lumbar and hip since ongoing  > 1 month. Tramadol helps some, no relief with tylenol and   NSAIDs contraindicated  (may need to call Dr. Cherylann Ratel to verify low dose NSAID cannot be used temporarily.) Start home PT. Likely would benefit from formal PT referral, but will await films.

## 2014-05-10 ENCOUNTER — Telehealth: Payer: Self-pay | Admitting: Family Medicine

## 2014-05-10 DIAGNOSIS — M545 Low back pain: Secondary | ICD-10-CM

## 2014-05-10 DIAGNOSIS — M7918 Myalgia, other site: Secondary | ICD-10-CM

## 2014-05-10 MED ORDER — MELOXICAM 7.5 MG PO TABS: 7.5000 mg | ORAL_TABLET | Freq: Every day | ORAL | Status: DC

## 2014-05-10 NOTE — Telephone Encounter (Signed)
Sent in rx.  Marion:  How do I refer to PT at [facility]? Home health? She is not home bound though so would not qualify? Just a PT referral?

## 2014-05-10 NOTE — Telephone Encounter (Signed)
Message copied by Excell Seltzer on Tue May 10, 2014  4:29 PM ------      Message from: Damita Lack      Created: Tue May 10, 2014  4:25 PM       Mrs. Baucum notified as instructed by telephone.  She would like to start the Meloxicam.  Please send in to [facility] on [address], [place].  She would also like to be referred for PT at [facility] (she lives there). ------

## 2014-05-12 NOTE — Addendum Note (Signed)
Addended byKerby Nora E on: 05/12/2014 01:14 PM   Modules accepted: Orders

## 2014-05-12 NOTE — Telephone Encounter (Signed)
Just place the PT order like usual and put down [facility] under comments. I will fax the order to [facility] and the patient can call them to schedule.

## 2014-05-16 DIAGNOSIS — M5430 Sciatica, unspecified side: Secondary | ICD-10-CM | POA: Diagnosis not present

## 2014-05-16 DIAGNOSIS — M6281 Muscle weakness (generalized): Secondary | ICD-10-CM | POA: Diagnosis not present

## 2014-05-18 DIAGNOSIS — M545 Low back pain, unspecified: Secondary | ICD-10-CM | POA: Diagnosis not present

## 2014-05-18 DIAGNOSIS — M6281 Muscle weakness (generalized): Secondary | ICD-10-CM | POA: Diagnosis not present

## 2014-05-19 DIAGNOSIS — M545 Low back pain, unspecified: Secondary | ICD-10-CM | POA: Diagnosis not present

## 2014-05-19 DIAGNOSIS — M6281 Muscle weakness (generalized): Secondary | ICD-10-CM | POA: Diagnosis not present

## 2014-05-23 DIAGNOSIS — M6281 Muscle weakness (generalized): Secondary | ICD-10-CM | POA: Diagnosis not present

## 2014-05-23 DIAGNOSIS — M545 Low back pain, unspecified: Secondary | ICD-10-CM | POA: Diagnosis not present

## 2014-05-30 DIAGNOSIS — M545 Low back pain, unspecified: Secondary | ICD-10-CM | POA: Diagnosis not present

## 2014-05-30 DIAGNOSIS — M6281 Muscle weakness (generalized): Secondary | ICD-10-CM | POA: Diagnosis not present

## 2014-05-31 DIAGNOSIS — M545 Low back pain, unspecified: Secondary | ICD-10-CM | POA: Diagnosis not present

## 2014-05-31 DIAGNOSIS — M6281 Muscle weakness (generalized): Secondary | ICD-10-CM | POA: Diagnosis not present

## 2014-06-02 DIAGNOSIS — M545 Low back pain, unspecified: Secondary | ICD-10-CM | POA: Diagnosis not present

## 2014-06-02 DIAGNOSIS — M6281 Muscle weakness (generalized): Secondary | ICD-10-CM | POA: Diagnosis not present

## 2014-06-13 DIAGNOSIS — M6281 Muscle weakness (generalized): Secondary | ICD-10-CM | POA: Diagnosis not present

## 2014-06-13 DIAGNOSIS — M545 Low back pain, unspecified: Secondary | ICD-10-CM | POA: Diagnosis not present

## 2014-07-07 DIAGNOSIS — Z23 Encounter for immunization: Secondary | ICD-10-CM | POA: Diagnosis not present

## 2014-09-20 DIAGNOSIS — I10 Essential (primary) hypertension: Secondary | ICD-10-CM | POA: Diagnosis not present

## 2014-09-20 DIAGNOSIS — N183 Chronic kidney disease, stage 3 (moderate): Secondary | ICD-10-CM | POA: Diagnosis not present

## 2014-09-20 DIAGNOSIS — N2581 Secondary hyperparathyroidism of renal origin: Secondary | ICD-10-CM | POA: Diagnosis not present

## 2014-09-20 DIAGNOSIS — R809 Proteinuria, unspecified: Secondary | ICD-10-CM | POA: Diagnosis not present

## 2014-09-23 DIAGNOSIS — R809 Proteinuria, unspecified: Secondary | ICD-10-CM | POA: Diagnosis not present

## 2014-09-23 DIAGNOSIS — N183 Chronic kidney disease, stage 3 (moderate): Secondary | ICD-10-CM | POA: Diagnosis not present

## 2014-09-23 DIAGNOSIS — N2581 Secondary hyperparathyroidism of renal origin: Secondary | ICD-10-CM | POA: Diagnosis not present

## 2014-09-23 DIAGNOSIS — I10 Essential (primary) hypertension: Secondary | ICD-10-CM | POA: Diagnosis not present

## 2014-09-29 ENCOUNTER — Telehealth: Payer: Self-pay | Admitting: *Deleted

## 2014-09-29 NOTE — Telephone Encounter (Signed)
Lm on pts vm requesting a call back if wanting to schedule flu shot. 

## 2015-02-06 DIAGNOSIS — N183 Chronic kidney disease, stage 3 (moderate): Secondary | ICD-10-CM | POA: Diagnosis not present

## 2015-02-06 DIAGNOSIS — I10 Essential (primary) hypertension: Secondary | ICD-10-CM | POA: Diagnosis not present

## 2015-02-06 DIAGNOSIS — N2581 Secondary hyperparathyroidism of renal origin: Secondary | ICD-10-CM | POA: Diagnosis not present

## 2015-02-06 DIAGNOSIS — R809 Proteinuria, unspecified: Secondary | ICD-10-CM | POA: Diagnosis not present

## 2015-02-07 ENCOUNTER — Encounter: Payer: Self-pay | Admitting: Family Medicine

## 2015-02-07 ENCOUNTER — Ambulatory Visit (INDEPENDENT_AMBULATORY_CARE_PROVIDER_SITE_OTHER): Payer: Medicare Other | Admitting: Family Medicine

## 2015-02-07 VITALS — BP 120/76 | HR 49 | Temp 97.7°F | Ht 60.5 in | Wt 163.0 lb

## 2015-02-07 DIAGNOSIS — N644 Mastodynia: Secondary | ICD-10-CM | POA: Diagnosis not present

## 2015-02-07 NOTE — Progress Notes (Addendum)
   Subjective:    Patient ID: Natasha KennerSusan D Sawyer, female    DOB: 02/28/1941, 74 y.o.   MRN: [id number]  HPI   74 year old female with history of HTN, CKD presetns with new onset pain in  left breast. Intermittent ache in left upper breast and chest wall. Tender to touch in one area in left upper chest wall.  no masses, no rash, no skin or nipple changes. No redness.  Feels well otherwise.  Some occ fatigue.  No fever.  No unexpected weight loss, no night sweats. No cahnge in pain with exertion or movement of arm.    Last mammogram in 2013. No breast history. Family hoistory: no first degree relative with breat cancer.   Review of Systems  Constitutional: Positive for fatigue. Negative for fever.  Respiratory: Negative for shortness of breath.   Cardiovascular: Negative for chest pain and leg swelling.  Skin: Positive for rash.       Small red rash on left arm       Objective:   Physical Exam  Constitutional: She appears well-developed and well-nourished.  Eyes: Pupils are equal, round, and reactive to light.  Neck: Normal range of motion.  Cardiovascular: Normal rate and regular rhythm.  Exam reveals no friction rub.   No murmur heard. Pulmonary/Chest: Effort normal and breath sounds normal. No respiratory distress.  Genitourinary: There is breast tenderness. No breast swelling, discharge or bleeding.  ttp at 1 oclock on left breast, no mass.  Skin: Skin is warm and dry.  Small red patch on left arm          Assessment & Plan:

## 2015-02-07 NOTE — Progress Notes (Signed)
Pre visit review using our clinic review tool, if applicable. No additional management support is needed unless otherwise documented below in the visit note. 

## 2015-02-07 NOTE — Assessment & Plan Note (Signed)
Overdue  for mammogram screening bilateral. Will eval with diagnostic mamm and US.  Decrease caffeine.  No  clear cardiac  related chest pain.  If neg eval may be MSK strain of chest wall.

## 2015-02-07 NOTE — Patient Instructions (Signed)
Cut back on caffiene.  Stop at front desk to set up mammogram/US to eval left breast.

## 2015-02-07 NOTE — Addendum Note (Signed)
Addended by: Damita LackLORING, DONNA S on: 02/07/2015 10:40 AM   Modules accepted: Orders

## 2015-02-10 DIAGNOSIS — N182 Chronic kidney disease, stage 2 (mild): Secondary | ICD-10-CM | POA: Diagnosis not present

## 2015-02-10 DIAGNOSIS — R809 Proteinuria, unspecified: Secondary | ICD-10-CM | POA: Diagnosis not present

## 2015-02-10 DIAGNOSIS — I10 Essential (primary) hypertension: Secondary | ICD-10-CM | POA: Diagnosis not present

## 2015-02-14 ENCOUNTER — Ambulatory Visit: Payer: Medicare Other

## 2015-02-14 ENCOUNTER — Ambulatory Visit
Admission: RE | Admit: 2015-02-14 | Discharge: 2015-02-14 | Disposition: A | Discharge status: Home / Self Care | Payer: Medicare Other | Source: Ambulatory Visit | Attending: Family Medicine | Admitting: Family Medicine

## 2015-02-14 ENCOUNTER — Other Ambulatory Visit: Payer: Self-pay | Admitting: Family Medicine

## 2015-02-14 DIAGNOSIS — N644 Mastodynia: Secondary | ICD-10-CM | POA: Diagnosis not present

## 2015-02-14 DIAGNOSIS — N63 Unspecified lump in breast: Secondary | ICD-10-CM | POA: Diagnosis not present

## 2015-02-14 DIAGNOSIS — Z1231 Encounter for screening mammogram for malignant neoplasm of breast: Secondary | ICD-10-CM

## 2015-02-14 LAB — HM MAMMOGRAPHY

## 2015-02-20 ENCOUNTER — Encounter: Payer: Self-pay | Admitting: Family Medicine

## 2015-07-13 DIAGNOSIS — Z23 Encounter for immunization: Secondary | ICD-10-CM | POA: Diagnosis not present

## 2015-07-24 DIAGNOSIS — N183 Chronic kidney disease, stage 3 (moderate): Secondary | ICD-10-CM | POA: Diagnosis not present

## 2015-07-24 DIAGNOSIS — I10 Essential (primary) hypertension: Secondary | ICD-10-CM | POA: Diagnosis not present

## 2015-07-28 DIAGNOSIS — N182 Chronic kidney disease, stage 2 (mild): Secondary | ICD-10-CM | POA: Diagnosis not present

## 2015-07-28 DIAGNOSIS — N2581 Secondary hyperparathyroidism of renal origin: Secondary | ICD-10-CM | POA: Diagnosis not present

## 2015-07-28 DIAGNOSIS — R809 Proteinuria, unspecified: Secondary | ICD-10-CM | POA: Diagnosis not present

## 2015-07-28 DIAGNOSIS — I10 Essential (primary) hypertension: Secondary | ICD-10-CM | POA: Diagnosis not present

## 2015-11-20 DIAGNOSIS — N2581 Secondary hyperparathyroidism of renal origin: Secondary | ICD-10-CM | POA: Diagnosis not present

## 2015-11-20 DIAGNOSIS — N183 Chronic kidney disease, stage 3 (moderate): Secondary | ICD-10-CM | POA: Diagnosis not present

## 2015-11-20 DIAGNOSIS — R809 Proteinuria, unspecified: Secondary | ICD-10-CM | POA: Diagnosis not present

## 2015-11-20 DIAGNOSIS — I10 Essential (primary) hypertension: Secondary | ICD-10-CM | POA: Diagnosis not present

## 2015-11-24 DIAGNOSIS — I10 Essential (primary) hypertension: Secondary | ICD-10-CM | POA: Diagnosis not present

## 2015-11-24 DIAGNOSIS — N183 Chronic kidney disease, stage 3 (moderate): Secondary | ICD-10-CM | POA: Diagnosis not present

## 2015-11-24 DIAGNOSIS — R809 Proteinuria, unspecified: Secondary | ICD-10-CM | POA: Diagnosis not present

## 2015-11-24 DIAGNOSIS — N2581 Secondary hyperparathyroidism of renal origin: Secondary | ICD-10-CM | POA: Diagnosis not present

## 2015-11-28 DIAGNOSIS — N183 Chronic kidney disease, stage 3 (moderate): Secondary | ICD-10-CM | POA: Diagnosis not present

## 2015-11-28 DIAGNOSIS — N2581 Secondary hyperparathyroidism of renal origin: Secondary | ICD-10-CM | POA: Diagnosis not present

## 2015-11-28 DIAGNOSIS — I10 Essential (primary) hypertension: Secondary | ICD-10-CM | POA: Diagnosis not present

## 2015-12-01 ENCOUNTER — Telehealth: Payer: Self-pay | Admitting: Family Medicine

## 2015-12-01 NOTE — Telephone Encounter (Signed)
LM for pt to sch AWV with Lesia, mn °

## 2016-01-03 ENCOUNTER — Other Ambulatory Visit: Payer: Self-pay | Admitting: Family Medicine

## 2016-01-03 DIAGNOSIS — Z1231 Encounter for screening mammogram for malignant neoplasm of breast: Secondary | ICD-10-CM

## 2016-02-16 ENCOUNTER — Other Ambulatory Visit: Payer: Self-pay | Admitting: Family Medicine

## 2016-02-16 ENCOUNTER — Ambulatory Visit
Admission: RE | Admit: 2016-02-16 | Discharge: 2016-02-16 | Disposition: A | Discharge status: Home / Self Care | Payer: Medicare Other | Source: Ambulatory Visit | Attending: Family Medicine | Admitting: Family Medicine

## 2016-02-16 DIAGNOSIS — Z1231 Encounter for screening mammogram for malignant neoplasm of breast: Secondary | ICD-10-CM | POA: Diagnosis not present

## 2016-03-11 DIAGNOSIS — N2581 Secondary hyperparathyroidism of renal origin: Secondary | ICD-10-CM | POA: Diagnosis not present

## 2016-03-11 DIAGNOSIS — R809 Proteinuria, unspecified: Secondary | ICD-10-CM | POA: Diagnosis not present

## 2016-03-11 DIAGNOSIS — N183 Chronic kidney disease, stage 3 (moderate): Secondary | ICD-10-CM | POA: Diagnosis not present

## 2016-03-11 DIAGNOSIS — I10 Essential (primary) hypertension: Secondary | ICD-10-CM | POA: Diagnosis not present

## 2016-03-15 DIAGNOSIS — R809 Proteinuria, unspecified: Secondary | ICD-10-CM | POA: Diagnosis not present

## 2016-03-15 DIAGNOSIS — I10 Essential (primary) hypertension: Secondary | ICD-10-CM | POA: Diagnosis not present

## 2016-03-15 DIAGNOSIS — N183 Chronic kidney disease, stage 3 (moderate): Secondary | ICD-10-CM | POA: Diagnosis not present

## 2016-03-15 DIAGNOSIS — N2581 Secondary hyperparathyroidism of renal origin: Secondary | ICD-10-CM | POA: Diagnosis not present

## 2016-05-16 ENCOUNTER — Ambulatory Visit: Payer: Self-pay

## 2016-06-12 ENCOUNTER — Ambulatory Visit (INDEPENDENT_AMBULATORY_CARE_PROVIDER_SITE_OTHER): Payer: Medicare Other

## 2016-06-12 VITALS — BP 122/80 | HR 48 | Temp 98.4°F | Ht 60.0 in | Wt 162.0 lb

## 2016-06-12 DIAGNOSIS — Z0000 Encounter for general adult medical examination without abnormal findings: Secondary | ICD-10-CM | POA: Diagnosis not present

## 2016-06-12 DIAGNOSIS — E559 Vitamin D deficiency, unspecified: Secondary | ICD-10-CM | POA: Diagnosis not present

## 2016-06-12 DIAGNOSIS — E2839 Other primary ovarian failure: Secondary | ICD-10-CM | POA: Diagnosis not present

## 2016-06-12 DIAGNOSIS — I10 Essential (primary) hypertension: Secondary | ICD-10-CM

## 2016-06-12 DIAGNOSIS — E785 Hyperlipidemia, unspecified: Secondary | ICD-10-CM | POA: Diagnosis not present

## 2016-06-12 LAB — LIPID PANEL
CHOL/HDL RATIO: 3
CHOLESTEROL: 227 mg/dL — AB (ref 0–200)
HDL: 68.4 mg/dL (ref >39.00)
LDL CALC: 128 mg/dL — AB (ref 0–99)
NonHDL: 158.97
TRIGLYCERIDES: 153 mg/dL — AB (ref 0.0–149.0)
VLDL: 30.6 mg/dL (ref 0.0–40.0)

## 2016-06-12 LAB — COMPREHENSIVE METABOLIC PANEL
ALBUMIN: 3.9 g/dL (ref 3.5–5.2)
ALT: 11 U/L (ref 0–35)
AST: 16 U/L (ref 0–37)
Alkaline Phosphatase: 68 U/L (ref 39–117)
BILIRUBIN TOTAL: 0.3 mg/dL (ref 0.2–1.2)
BUN: 21 mg/dL (ref 6–23)
CO2: 28 meq/L (ref 19–32)
Calcium: 9.4 mg/dL (ref 8.4–10.5)
Chloride: 105 mEq/L (ref 96–112)
Creatinine, Ser: 1 mg/dL (ref 0.40–1.20)
GFR: 57.68 mL/min — AB (ref >60.00)
Glucose, Bld: 93 mg/dL (ref 70–99)
Potassium: 5.2 mEq/L — ABNORMAL HIGH (ref 3.5–5.1)
Sodium: 140 mEq/L (ref 135–145)
Total Protein: 6.9 g/dL (ref 6.0–8.3)

## 2016-06-12 LAB — CBC WITH DIFFERENTIAL/PLATELET
Basophils Absolute: 0 10*3/uL (ref 0.0–0.1)
Basophils Relative: 0.3 % (ref 0.0–3.0)
EOS ABS: 0.1 10*3/uL (ref 0.0–0.7)
Eosinophils Relative: 1.2 % (ref 0.0–5.0)
HEMATOCRIT: 38 % (ref 36.0–46.0)
Hemoglobin: 12.8 g/dL (ref 12.0–15.0)
LYMPHS PCT: 25.7 % (ref 12.0–46.0)
Lymphs Abs: 1.8 10*3/uL (ref 0.7–4.0)
MCHC: 33.6 g/dL (ref 30.0–36.0)
MCV: 93.1 fl (ref 78.0–100.0)
MONOS PCT: 7.1 % (ref 3.0–12.0)
Monocytes Absolute: 0.5 10*3/uL (ref 0.1–1.0)
NEUTROS ABS: 4.5 10*3/uL (ref 1.4–7.7)
Neutrophils Relative %: 65.7 % (ref 43.0–77.0)
PLATELETS: 248 10*3/uL (ref 150.0–400.0)
RBC: 4.08 Mil/uL (ref 3.87–5.11)
RDW: 14.8 % (ref 11.5–15.5)
WBC: 6.8 10*3/uL (ref 4.0–10.5)

## 2016-06-12 LAB — TSH: TSH: 1.8 u[IU]/mL (ref 0.35–4.50)

## 2016-06-12 LAB — VITAMIN D 25 HYDROXY (VIT D DEFICIENCY, FRACTURES): VITD: 44.03 ng/mL (ref 30.00–100.00)

## 2016-06-12 NOTE — Progress Notes (Signed)
I reviewed health advisor's note, was available for consultation, and agree with documentation and plan.   Signed,  Rozalia Dino T. Avielle Imbert, MD  

## 2016-06-12 NOTE — Patient Instructions (Signed)
Ms. Haskell RilingRhyne , Thank you for taking time to come for your Medicare Wellness Visit. I appreciate your ongoing commitment to your health goals. Please review the following plan we discussed and let me know if I can assist you in the future.   These are the goals we discussed: Goals    . physical          Starting 06/12/2016, I will continue to walk at least 1 mile twice daily.        This is a list of the screening recommended for you and due dates:  Health Maintenance  Topic Date Due  . Flu Shot  09/15/2016*  . Stool Blood Test  06/12/2017*  . Colon Cancer Screening  06/12/2017*  . Pneumonia vaccines (1 of 2 - PCV13) 06/12/2017*  . DEXA scan (bone density measurement)  06/15/2017*  . Mammogram  02/15/2017  . Tetanus Vaccine  02/11/2021  . Shingles Vaccine  Completed  *Topic was postponed. The date shown is not the original due date.   Preventive Care for Adults  A healthy lifestyle and preventive care can promote health and wellness. Preventive health guidelines for adults include the following key practices.  . A routine yearly physical is a good way to check with your health care provider about your health and preventive screening. It is a chance to share any concerns and updates on your health and to receive a thorough exam.  . Visit your dentist for a routine exam and preventive care every 6 months. Brush your teeth twice a day and floss once a day. Good oral hygiene prevents tooth decay and gum disease.  . The frequency of eye exams is based on your age, health, family medical history, use  of contact lenses, and other factors. Follow your health care provider's ecommendations for frequency of eye exams.  . Eat a healthy diet. Foods like vegetables, fruits, whole grains, low-fat dairy products, and lean protein foods contain the nutrients you need without too many calories. Decrease your intake of foods high in solid fats, added sugars, and salt. Eat the right amount of calories  for you. Get information about a proper diet from your health care provider, if necessary.  . Regular physical exercise is one of the most important things you can do for your health. Most adults should get at least 150 minutes of moderate-intensity exercise (any activity that increases your heart rate and causes you to sweat) each week. In addition, most adults need muscle-strengthening exercises on 2 or more days a week.  Silver Sneakers may be a benefit available to you. To determine eligibility, you may visit the website: www.silversneakers.com or contact program at [phone] Mon-Fri between 8AM-8PM.   . Maintain a healthy weight. The body mass index (BMI) is a screening tool to identify possible weight problems. It provides an estimate of body fat based on height and weight. Your health care provider can find your BMI and can help you achieve or maintain a healthy weight.   For adults 20 years and older: ? A BMI below 18.5 is considered underweight. ? A BMI of 18.5 to 24.9 is normal. ? A BMI of 25 to 29.9 is considered overweight. ? A BMI of 30 and above is considered obese.   . Maintain normal blood lipids and cholesterol levels by exercising and minimizing your intake of saturated fat. Eat a balanced diet with plenty of fruit and vegetables. Blood tests for lipids and cholesterol should begin at age 120 and be  repeated every 5 years. If your lipid or cholesterol levels are high, you are over 50, or you are at high risk for heart disease, you may need your cholesterol levels checked more frequently. Ongoing high lipid and cholesterol levels should be treated with medicines if diet and exercise are not working.  . If you smoke, find out from your health care provider how to quit. If you do not use tobacco, please do not start.  . If you choose to drink alcohol, please do not consume more than 2 drinks per day. One drink is considered to be 12 ounces (355 mL) of beer, 5 ounces (148 mL) of  wine, or 1.5 ounces (44 mL) of liquor.  . If you are 39-37 years old, ask your health care provider if you should take aspirin to prevent strokes.  . Use sunscreen. Apply sunscreen liberally and repeatedly throughout the day. You should seek shade when your shadow is shorter than you. Protect yourself by wearing long sleeves, pants, a wide-brimmed hat, and sunglasses year round, whenever you are outdoors.  . Once a month, do a whole body skin exam, using a mirror to look at the skin on your back. Tell your health care provider of new moles, moles that have irregular borders, moles that are larger than a pencil eraser, or moles that have changed in shape or color.

## 2016-06-12 NOTE — Progress Notes (Signed)
PCP notes:   Health maintenance:  Flu vaccine - pt takes vaccine at [facility] - pt reports [facility] have [facility]; pt will obtain record from [facility] to confirm Colon cancer screening - pt will discuss best choice for screening with PCP at next appt Bone density - pt will complete bone density with mammogram in 2018  Abnormal screenings:   Hearing - failed  Patient concerns:   None   Nurse concerns:  None  Next PCP appt:   06/21/16 @ 0900

## 2016-06-12 NOTE — Progress Notes (Signed)
Subjective:   Natasha Sawyer is a 75 y.o. female who presents for Medicare Annual (Subsequent) preventive examination.  Review of Systems:  N/A Cardiac Risk Factors include: advanced age (>2355men, 36>65 women);dyslipidemia;hypertension;obesity (BMI >30kg/m2)     Objective:     Vitals: BP 122/80 (BP Location: Right Arm, Patient Position: Sitting, Cuff Size: Normal)   Pulse (!) 48   Temp 98.4 F (36.9 C) (Oral)   Ht 5' (1.524 m) Comment: no shoes  Wt 162 lb (73.5 kg)   SpO2 97%   BMI 31.64 kg/m   Body mass index is 31.64 kg/m.   Tobacco History  Smoking Status  . Never Smoker  Smokeless Tobacco  . Never Used     Counseling given: No   Past Medical History:  Diagnosis Date  . Chronic kidney disease   . Hyperlipidemia   . Hypertension   . Premature beats    Past Surgical History:  Procedure Laterality Date  . APPENDECTOMY    . CESAREAN SECTION    . EYE SURGERY     cataract and eyelid  . HERNIA REPAIR     Family History  Problem Relation Age of Onset  . Diabetes Mother   . Stroke Father    History  Sexual Activity  . Sexual activity: No    Outpatient Encounter Prescriptions as of 06/12/2016  Medication Sig  . Acetaminophen (TYLENOL ARTHRITIS PAIN PO) Take 650 mg by mouth daily.  . enalapril (VASOTEC) 10 MG tablet Take 20 mg by mouth daily.   . Ergocalciferol (VITAMIN D2) 2000 UNITS TABS Take 1 tablet by mouth daily.  . metoprolol succinate (TOPROL-XL) 25 MG 24 hr tablet take 1 tablet by mouth once daily  . zolpidem (AMBIEN) 5 MG tablet Take 2.5 mg by mouth at bedtime as needed.   . ezetimibe-simvastatin (VYTORIN) 10-20 MG per tablet Take 1 tablet by mouth at bedtime.  . [DISCONTINUED] amLODipine (NORVASC) 5 MG tablet Take 1 tablet by mouth daily.   No facility-administered encounter medications on file as of 06/12/2016.     Activities of Daily Living In your present state of health, do you have any difficulty performing the following activities:  06/12/2016  Hearing? Y  Vision? N  Difficulty concentrating or making decisions? Y  Walking or climbing stairs? Y  Dressing or bathing? N  Doing errands, shopping? N  Preparing Food and eating ? N  Using the Toilet? N  In the past six months, have you accidently leaked urine? Y  Do you have problems with loss of bowel control? N  Managing your Medications? N  Managing your Finances? N  Housekeeping or managing your Housekeeping? N  Some recent data might be hidden    Patient Care Team: Excell SeltzerAmy E Bedsole, MD as PCP - General Mun[place]or Cherylann RatelLateef, MD as Consulting Physician (Internal Medicine)    Assessment:      Hearing Screening   125Hz  250Hz  500Hz  1000Hz  2000Hz  3000Hz  4000Hz  6000Hz  8000Hz   Right ear:   40 40 40  0    Left ear:   0 40 40  0      Visual Acuity Screening   Right eye Left eye Both eyes  Without correction: 20/25 20/20 20/20   With correction:       Exercise Activities and Dietary recommendations Current Exercise Habits: Home exercise routine, Type of exercise: walking, Time (Minutes): > 60, Frequency (Times/Week): 7, Weekly Exercise (Minutes/Week): 0, Intensity: Moderate, Exercise limited by: None identified  Goals    .  physical          Starting 06/12/2016, I will continue to walk at least 1 mile twice daily.       Fall Risk Fall Risk  06/12/2016  Falls in the past year? No   Depression Screen PHQ 2/9 Scores 06/12/2016  PHQ - 2 Score 0     Cognitive Testing MMSE - Mini Mental State Exam 06/12/2016  Orientation to time 5  Orientation to Place 5  Registration 3  Attention/ Calculation 0  Recall 3  Language- name 2 objects 0  Language- repeat 1  Language- follow 3 step command 3  Language- read & follow direction 0  Write a sentence 0  Copy design 0  Total score 20   PLEASE NOTE: A Mini-Cog screen was completed. Maximum score is 20. A value of 0 denotes this part of Folstein MMSE was not completed or the patient failed this part of the Mini-Cog  screening.   Mini-Cog Screening Orientation to Time - Max 5 pts Orientation to Place - Max 5 pts Registration - Max 3 pts Recall - Max 3 pts Language Repeat - Max 1 pts Language Follow 3 Step Command - Max 3 pts   Immunization History  Administered Date(s) Administered  . Influenza-Unspecified 06/16/2013  . Zoster 02/13/2012   Screening Tests Health Maintenance  Topic Date Due  . INFLUENZA VACCINE  09/15/2016 (Originally 04/16/2016)  . COLON CANCER SCREENING ANNUAL FOBT  06/12/2017 (Originally 02/17/2013)  . COLONOSCOPY  06/12/2017 (Originally 09/16/2014)  . PNA vac Low Risk Adult (1 of 2 - PCV13) 06/12/2017 (Originally 11/25/2007)  . DEXA SCAN  06/15/2017 (Originally 11/25/2007)  . MAMMOGRAM  02/15/2017  . TETANUS/TDAP  02/11/2021  . ZOSTAVAX  Completed      Plan:     I have personally reviewed and addressed the Medicare Annual Wellness questionnaire and have noted the following in the patient's chart:  A. Medical and social history B. Use of alcohol, tobacco or illicit drugs  C. Current medications and supplements D. Functional ability and status E.  Nutritional status F.  Physical activity G. Advance directives H. List of other physicians I.  Hospitalizations, surgeries, and ER visits in previous 12 months J.  Vitals K. Screenings to include hearing, vision, cognitive, depression L. Referrals and appointments - none  In addition, I have reviewed and discussed with patient certain preventive protocols, quality metrics, and best practice recommendations. A written personalized care plan for preventive services as well as general preventive health recommendations were provided to patient.  See attached scanned questionnaire for additional information.   Signed,   Randa Evens, MHA, BS, LPN Health Advisor

## 2016-06-12 NOTE — Progress Notes (Signed)
Pre visit review using our clinic review tool, if applicable. No additional management support is needed unless otherwise documented below in the visit note. 

## 2016-06-13 ENCOUNTER — Other Ambulatory Visit: Payer: Self-pay | Admitting: Family Medicine

## 2016-06-13 DIAGNOSIS — Z1231 Encounter for screening mammogram for malignant neoplasm of breast: Secondary | ICD-10-CM

## 2016-06-21 ENCOUNTER — Encounter: Payer: Self-pay | Admitting: Family Medicine

## 2016-06-21 ENCOUNTER — Ambulatory Visit (INDEPENDENT_AMBULATORY_CARE_PROVIDER_SITE_OTHER): Payer: Medicare Other | Admitting: Family Medicine

## 2016-06-21 VITALS — BP 120/80 | HR 49 | Temp 97.6°F | Ht 60.0 in | Wt 161.8 lb

## 2016-06-21 DIAGNOSIS — N183 Chronic kidney disease, stage 3 unspecified: Secondary | ICD-10-CM

## 2016-06-21 DIAGNOSIS — I10 Essential (primary) hypertension: Secondary | ICD-10-CM

## 2016-06-21 DIAGNOSIS — R140 Abdominal distension (gaseous): Secondary | ICD-10-CM | POA: Insufficient documentation

## 2016-06-21 DIAGNOSIS — R001 Bradycardia, unspecified: Secondary | ICD-10-CM | POA: Diagnosis not present

## 2016-06-21 DIAGNOSIS — E782 Mixed hyperlipidemia: Secondary | ICD-10-CM

## 2016-06-21 MED ORDER — METOPROLOL TARTRATE 25 MG PO TABS: 12.5000 mg | ORAL_TABLET | Freq: Two times a day (BID) | ORAL | 11 refills | Status: DC

## 2016-06-21 MED ORDER — EZETIMIBE-SIMVASTATIN 10-20 MG PO TABS: 1.0000 | ORAL_TABLET | Freq: Every day | ORAL | 11 refills | Status: DC

## 2016-06-21 NOTE — Assessment & Plan Note (Signed)
Stable , followed by Dr. Cherylann RatelLateef.

## 2016-06-21 NOTE — Patient Instructions (Addendum)
Stop Toprol XL. Change to metoprolol 12.5 mg twice daily (1/2 a tab of 25 mg). Follow BP and pulse at home.. Call or email in 1-2 weeks with measurements.  Start vytorin again daily.  Send us [facility] info on pneumonia vaccines.  Try probiotic (lactobaccili) like align or culturelle. For gas bloating.

## 2016-06-21 NOTE — Assessment & Plan Note (Signed)
Trial of probiotic 

## 2016-06-21 NOTE — Progress Notes (Signed)
PART 2 Medicare Wellness Earlier  she saw Lu Duffel, LPN for medicare wellness 06/12/2016. Note will be reviewed in detail when completed.   She is doing well overall. She has noted a lot of abdominal bloating, central obesity.  No big change after eating. She does pass gas a lot. No abd pain, no diarrhea, no constipation. Regular BMs every other day, no blood in stool.  Hypertension:  Well controlled on amlodipine, enalapril and toprol. BP Readings from Last 3 Encounters:  06/21/16 120/80  06/12/16 122/80  02/07/15 120/76   Wt Readings from Last 3 Encounters:  06/21/16 161 lb 12 oz (73.4 kg)  06/12/16 162 lb (73.5 kg)  02/07/15 163 lb (73.9 kg)  Her pulse is low today.. She is on Bblocker. No dizziness and lightheadedness.  Using medication without problems or lightheadedness: None Chest pain with exertion: none Edema:None Short of breath:None Average home BPs: not checking Other issues:  Elevated Cholesterol:  No longer on Vytorin as was on samples,  LDL at goal < 130.    Lab Results  Component Value Date   CHOL 227 (H) 06/12/2016   HDL 68.40 06/12/2016   LDLCALC 128 (H) 06/12/2016   TRIG 153.0 (H) 06/12/2016   CHOLHDL 3 06/12/2016   Using medications without problems:None Muscle aches: None Diet compliance:Good Exercise:yes,  Walking 2 miles day. Other complaints:  CKD stage 3: followed by nephrology. Stable,  Cr 1.0 and GRF 57.68  Has upcoming OV  With Dr., Cherylann Ratel.  Social History /Family History/Past Medical History reviewed and updated if needed.   Review of Systems  Constitutional: Positive for fatigue. Negative for fever and unexpected weight change.  HENT: Negative for ear pain, congestion, sore throat, sneezing, trouble swallowing and sinus pressure.   Eyes: Negative for pain and itching.  Respiratory: Negative for cough, shortness of breath and wheezing.   Cardiovascular: Positive for chest pain. Negative for palpitations and leg  swelling.  Gastrointestinal: Negative for nausea, abdominal pain, diarrhea, constipation and blood in stool.  Genitourinary: Negative for dysuria, hematuria, vaginal discharge, difficulty urinating and menstrual problem.  Skin: Negative for rash.  Neurological: Negative for syncope, weakness, light-headedness, numbness and headaches.  Psychiatric/Behavioral: Negative for confusion and dysphoric mood. The patient is not nervous/anxious.           Objective:   Physical Exam  Constitutional: Vital signs are normal. She appears well-developed and well-nourished. She is cooperative.  Non-toxic appearance. She does not appear ill. No distress.  HENT:  Head: Normocephalic.  Right Ear: Hearing, tympanic membrane, external ear and ear canal normal.  Left Ear: Hearing, tympanic membrane, external ear and ear canal normal.  Nose: Nose normal.  Eyes: Conjunctivae, EOM and lids are normal. Pupils are equal, round, and reactive to light. No foreign bodies found.  Neck: Trachea normal and normal range of motion. Neck supple. Carotid bruit is not present. No mass and no thyromegaly present.  Cardiovascular: Normal rate, regular rhythm, S1 normal, S2 normal, normal heart sounds and intact distal pulses.  Exam reveals no gallop.   No murmur heard. Pulmonary/Chest: Effort normal and breath sounds normal. No respiratory distress. She has no wheezes. She has no rhonchi. She has no rales.  Abdominal: Soft. Normal appearance and bowel sounds are normal. She exhibits no distension, no fluid wave, no abdominal bruit and no mass. There is no hepatosplenomegaly. There is no tenderness. There is no rebound, no guarding and no CVA tenderness. No hernia.  Genitourinary:  Not examined. Lymphadenopathy:  She has no cervical adenopathy.    She has no axillary adenopathy.  Neurological: She is alert. She has normal strength. No cranial nerve deficit or sensory deficit.  Skin: Skin is warm, dry and intact. No rash noted.   Psychiatric: Her speech is normal and behavior is normal. Judgment normal. Her mood appears not anxious. Cognition and memory are normal. She does not exhibit a depressed mood.          Assessment & Plan:  The patient's preventative maintenance and recommended screening tests for an annual wellness exam were reviewed in full today. Brought up to date unless services declined.  Counselled on the importance of diet, exercise, and its role in overall health and mortality. The patient's FH and SH was reviewed, including their home life, tobacco status, and drug and alcohol status.   Mammo: 02/2016  PAP/DVE:  Previously done with Dr. Barnabas ListerWashington, wants done here now,last pap 2012 nml, okay with stopping these., asymptomatic, no family history of ovarian or uterine cancer: WILL STOP. Colon: last done many years ago 2006 : nml, will plan to do cologuard. Nonsmoker Flu vaccine - pt [facility] - pt reports [facility]; pt will [facility] to confirm Bone density - pt will complete bone density with mammogram in 2018

## 2016-06-21 NOTE — Assessment & Plan Note (Signed)
Well controlled on current med follow as decreasing metoprolol for bradycardia.

## 2016-06-21 NOTE — Progress Notes (Signed)
Pre visit review using our clinic review tool, if applicable. No additional management support is needed unless otherwise documented below in the visit note. 

## 2016-06-21 NOTE — Assessment & Plan Note (Signed)
Poor control. Restart vytorin. If not able to afford will change to simvastatin.

## 2016-06-21 NOTE — Assessment & Plan Note (Signed)
Due to BBlocker and CCB.  Will decrease Toprol Xl and follow BP and pulse closely.

## 2016-06-24 DIAGNOSIS — N2581 Secondary hyperparathyroidism of renal origin: Secondary | ICD-10-CM | POA: Diagnosis not present

## 2016-06-24 DIAGNOSIS — I10 Essential (primary) hypertension: Secondary | ICD-10-CM | POA: Diagnosis not present

## 2016-06-24 DIAGNOSIS — R809 Proteinuria, unspecified: Secondary | ICD-10-CM | POA: Diagnosis not present

## 2016-06-24 DIAGNOSIS — N183 Chronic kidney disease, stage 3 (moderate): Secondary | ICD-10-CM | POA: Diagnosis not present

## 2016-06-28 ENCOUNTER — Telehealth: Payer: Self-pay

## 2016-06-28 DIAGNOSIS — N183 Chronic kidney disease, stage 3 (moderate): Secondary | ICD-10-CM | POA: Diagnosis not present

## 2016-06-28 DIAGNOSIS — I10 Essential (primary) hypertension: Secondary | ICD-10-CM | POA: Diagnosis not present

## 2016-06-28 DIAGNOSIS — N2581 Secondary hyperparathyroidism of renal origin: Secondary | ICD-10-CM | POA: Diagnosis not present

## 2016-06-28 NOTE — Telephone Encounter (Signed)
Ms. Haskell RilingRhyne notified as instructed by telephone.  Medication list updated.

## 2016-06-28 NOTE — Telephone Encounter (Signed)
Pt saw Dr Ermalene SearingBedsole on 06/21/16 and changed to metoprolol 12.5 mg bid.  Pt has been monitoring BP and P. BP averaging 125/80 and P is in the 40s. Pt saw Dr Cherylann RatelLateef nephrologist this morning and he suggested stopping the metoprolol completely and if BP starts to go up can adjust the amlodipine dosage to take care of BP. Also suggest after stopping metoprolol if pulse rate continues to be low to get cardiology referral. Pt wants to know what Dr Ermalene SearingBedsole thinks and request cb.[facility] [address]

## 2016-06-28 NOTE — Telephone Encounter (Signed)
Agree with stopping BBLOcker entirely.  Follow BP and pulse.. Call if BP > 140/90

## 2016-07-11 DIAGNOSIS — Z23 Encounter for immunization: Secondary | ICD-10-CM | POA: Diagnosis not present

## 2016-07-17 DIAGNOSIS — Z1211 Encounter for screening for malignant neoplasm of colon: Secondary | ICD-10-CM | POA: Diagnosis not present

## 2016-07-17 DIAGNOSIS — Z1212 Encounter for screening for malignant neoplasm of rectum: Secondary | ICD-10-CM | POA: Diagnosis not present

## 2016-07-17 LAB — COLOGUARD: COLOGUARD: NEGATIVE

## 2016-07-26 ENCOUNTER — Encounter: Payer: Self-pay | Admitting: Family Medicine

## 2016-10-16 ENCOUNTER — Other Ambulatory Visit: Payer: Self-pay

## 2016-10-16 MED ORDER — EZETIMIBE-SIMVASTATIN 10-20 MG PO TABS: 1.0000 | ORAL_TABLET | Freq: Every day | ORAL | 2 refills | Status: DC

## 2016-10-16 NOTE — Telephone Encounter (Signed)
Pt request ref[facility]; pt has already [facility] [address] Refill done as requested per protocol; pt advised and voiced understanding. [facility].

## 2016-10-21 DIAGNOSIS — R809 Proteinuria, unspecified: Secondary | ICD-10-CM | POA: Diagnosis not present

## 2016-10-21 DIAGNOSIS — N183 Chronic kidney disease, stage 3 (moderate): Secondary | ICD-10-CM | POA: Diagnosis not present

## 2016-10-21 DIAGNOSIS — I10 Essential (primary) hypertension: Secondary | ICD-10-CM | POA: Diagnosis not present

## 2016-10-21 DIAGNOSIS — N2581 Secondary hyperparathyroidism of renal origin: Secondary | ICD-10-CM | POA: Diagnosis not present

## 2016-10-25 DIAGNOSIS — I10 Essential (primary) hypertension: Secondary | ICD-10-CM | POA: Diagnosis not present

## 2016-10-25 DIAGNOSIS — R809 Proteinuria, unspecified: Secondary | ICD-10-CM | POA: Diagnosis not present

## 2016-10-25 DIAGNOSIS — N183 Chronic kidney disease, stage 3 (moderate): Secondary | ICD-10-CM | POA: Diagnosis not present

## 2016-10-25 DIAGNOSIS — N2581 Secondary hyperparathyroidism of renal origin: Secondary | ICD-10-CM | POA: Diagnosis not present

## 2017-01-07 ENCOUNTER — Ambulatory Visit (INDEPENDENT_AMBULATORY_CARE_PROVIDER_SITE_OTHER)
Admission: RE | Admit: 2017-01-07 | Discharge: 2017-01-07 | Disposition: A | Discharge status: Home / Self Care | Payer: Medicare Other | Source: Ambulatory Visit | Attending: Family Medicine | Admitting: Family Medicine

## 2017-01-07 ENCOUNTER — Ambulatory Visit (INDEPENDENT_AMBULATORY_CARE_PROVIDER_SITE_OTHER): Payer: Medicare Other | Admitting: Family Medicine

## 2017-01-07 ENCOUNTER — Encounter: Payer: Self-pay | Admitting: Family Medicine

## 2017-01-07 DIAGNOSIS — M25552 Pain in left hip: Secondary | ICD-10-CM | POA: Diagnosis not present

## 2017-01-07 DIAGNOSIS — M160 Bilateral primary osteoarthritis of hip: Secondary | ICD-10-CM | POA: Diagnosis not present

## 2017-01-07 NOTE — Progress Notes (Signed)
Subjective:    Patient ID: Natasha Sawyer, female    DOB: 22-Dec-1940, 76 y.o.   MRN: [id number]  HPI   75 year old female pt with history of CKD stage 3 and h of lumbar back issues presents with new onset pain in  Left hip.   She reports after recent cruise.Marland Kitchen Awoke 3 weeks ago with new onset pain in left lateral hip, 10/10 pain.  She treated with tylenol arthritis strength.  Pain improved until now pain is occurring off and on.  5/10 now on bad days.  Pain is sharp, worse with walking when she first gets out of chair.  improves with more walking.  No pain with lying on left side.   At cruise  proceeding, she did do a lot of walking, no fall.  She would like to get -rays of the hip.   no current low back pain, no incontinence, no weakness, no numbness or pain in left leg     She has issues with her hip in past..   Review of Systems  Constitutional: Negative for fatigue and fever.  HENT: Negative for ear pain.   Eyes: Negative for pain.  Respiratory: Negative for chest tightness and shortness of breath.   Cardiovascular: Negative for chest pain, palpitations and leg swelling.  Gastrointestinal: Negative for abdominal pain.  Genitourinary: Negative for dysuria.       Objective:   Physical Exam  Constitutional: Vital signs are normal. She appears well-developed and well-nourished. She is cooperative.  Non-toxic appearance. She does not appear ill. No distress.  obese  HENT:  Head: Normocephalic.  Right Ear: Hearing, tympanic membrane, external ear and ear canal normal. Tympanic membrane is not erythematous, not retracted and not bulging.  Left Ear: Hearing, tympanic membrane, external ear and ear canal normal. Tympanic membrane is not erythematous, not retracted and not bulging.  Nose: No mucosal edema or rhinorrhea. Right sinus exhibits no maxillary sinus tenderness and no frontal sinus tenderness. Left sinus exhibits no maxillary sinus tenderness and no frontal sinus  tenderness.  Mouth/Throat: Uvula is midline, oropharynx is clear and moist and mucous membranes are normal.  Eyes: Conjunctivae, EOM and lids are normal. Pupils are equal, round, and reactive to light. Lids are everted and swept, no foreign bodies found.  Neck: Trachea normal and normal range of motion. Neck supple. Carotid bruit is not present. No thyroid mass and no thyromegaly present.  Cardiovascular: Normal rate, regular rhythm, S1 normal, S2 normal, normal heart sounds, intact distal pulses and normal pulses.  Exam reveals no gallop and no friction rub.   No murmur heard. Pulmonary/Chest: Effort normal and breath sounds normal. No tachypnea. No respiratory distress. She has no decreased breath sounds. She has no wheezes. She has no rhonchi. She has no rales.  Abdominal: Soft. Normal appearance and bowel sounds are normal. There is no tenderness.  Musculoskeletal:       Left hip: She exhibits decreased range of motion. She exhibits normal strength, no tenderness and no bony tenderness.       Thoracic back: Normal.       Lumbar back: Normal.  ttp over left SI joint, no vertebral pain or pain over left bursa Neg SLR, neg faber's  Neurological: She is alert.  Skin: Skin is warm, dry and intact. No rash noted.  Psychiatric: Her speech is normal and behavior is normal. Judgment and thought content normal. Her mood appears not anxious. Cognition and memory are normal. She does not exhibit  a depressed mood.          Assessment & Plan:

## 2017-01-07 NOTE — Patient Instructions (Signed)
Use tylenol for pain as needed.  Avoid ibuprofen and aleve. Start home stretching.  We will call with x-ray results.

## 2017-01-07 NOTE — Assessment & Plan Note (Signed)
Non tender of bursa, good ROM on bilateral hips, only mild decrease ROM bilaterally. No low back pain  Or s/s of sciatica.  Most consistent with SI joint strain.. Likely from change in activity on cruise.  Will eval hip joint with -ray.  Use tylenol for pain in setting of CKD.  Start home PT.  Consider PT referral if not improving as expected.

## 2017-01-07 NOTE — Progress Notes (Signed)
Pre visit review using our clinic review tool, if applicable. No additional management support is needed unless otherwise documented below in the visit note. 

## 2017-02-12 DIAGNOSIS — N183 Chronic kidney disease, stage 3 (moderate): Secondary | ICD-10-CM | POA: Diagnosis not present

## 2017-02-12 DIAGNOSIS — N2581 Secondary hyperparathyroidism of renal origin: Secondary | ICD-10-CM | POA: Diagnosis not present

## 2017-02-12 DIAGNOSIS — I10 Essential (primary) hypertension: Secondary | ICD-10-CM | POA: Diagnosis not present

## 2017-02-14 DIAGNOSIS — N183 Chronic kidney disease, stage 3 (moderate): Secondary | ICD-10-CM | POA: Diagnosis not present

## 2017-02-14 DIAGNOSIS — R809 Proteinuria, unspecified: Secondary | ICD-10-CM | POA: Diagnosis not present

## 2017-02-14 DIAGNOSIS — I10 Essential (primary) hypertension: Secondary | ICD-10-CM | POA: Diagnosis not present

## 2017-02-14 DIAGNOSIS — N2581 Secondary hyperparathyroidism of renal origin: Secondary | ICD-10-CM | POA: Diagnosis not present

## 2017-02-18 ENCOUNTER — Telehealth: Payer: Self-pay | Admitting: Family Medicine

## 2017-02-18 ENCOUNTER — Ambulatory Visit
Admission: RE | Admit: 2017-02-18 | Discharge: 2017-02-18 | Disposition: A | Discharge status: Home / Self Care | Payer: Medicare Other | Source: Ambulatory Visit | Attending: Family Medicine | Admitting: Family Medicine

## 2017-02-18 DIAGNOSIS — Z1231 Encounter for screening mammogram for malignant neoplasm of breast: Secondary | ICD-10-CM

## 2017-02-18 DIAGNOSIS — M85852 Other specified disorders of bone density and structure, left thigh: Secondary | ICD-10-CM | POA: Insufficient documentation

## 2017-02-18 DIAGNOSIS — I10 Essential (primary) hypertension: Secondary | ICD-10-CM | POA: Diagnosis not present

## 2017-02-18 DIAGNOSIS — N2581 Secondary hyperparathyroidism of renal origin: Secondary | ICD-10-CM | POA: Diagnosis not present

## 2017-02-18 DIAGNOSIS — M8589 Other specified disorders of bone density and structure, multiple sites: Secondary | ICD-10-CM

## 2017-02-18 DIAGNOSIS — E2839 Other primary ovarian failure: Secondary | ICD-10-CM

## 2017-02-18 DIAGNOSIS — N183 Chronic kidney disease, stage 3 (moderate): Secondary | ICD-10-CM | POA: Diagnosis not present

## 2017-02-18 DIAGNOSIS — M8580 Other specified disorders of bone density and structure, unspecified site: Secondary | ICD-10-CM | POA: Insufficient documentation

## 2017-02-18 NOTE — Telephone Encounter (Signed)
Osteopenia on bone density Recommend weight bearing exercise, calcium in diet and vit D supplement 400 IU 1-2 times daily.

## 2017-02-18 NOTE — Telephone Encounter (Signed)
Left message for Natasha Sawyer to return my call.

## 2017-02-18 NOTE — Telephone Encounter (Signed)
Ms. Haskell RilingRhyne notified as instructed by telephone.

## 2017-06-15 ENCOUNTER — Telehealth: Payer: Self-pay | Admitting: Family Medicine

## 2017-06-15 DIAGNOSIS — E782 Mixed hyperlipidemia: Secondary | ICD-10-CM

## 2017-06-15 DIAGNOSIS — M8589 Other specified disorders of bone density and structure, multiple sites: Secondary | ICD-10-CM

## 2017-06-15 NOTE — Telephone Encounter (Signed)
-----   Message from Robert Bellow, LPN sent at 5/78/4696  5:36 PM EDT ----- Regarding: Labs 10/1 Lab orders needed.  Medicare

## 2017-06-16 ENCOUNTER — Ambulatory Visit (INDEPENDENT_AMBULATORY_CARE_PROVIDER_SITE_OTHER): Payer: Medicare Other

## 2017-06-16 ENCOUNTER — Telehealth: Payer: Self-pay

## 2017-06-16 VITALS — BP 106/72 | HR 54 | Temp 97.7°F | Ht 60.0 in | Wt 160.5 lb

## 2017-06-16 DIAGNOSIS — Z0000 Encounter for general adult medical examination without abnormal findings: Secondary | ICD-10-CM | POA: Diagnosis not present

## 2017-06-16 DIAGNOSIS — E782 Mixed hyperlipidemia: Secondary | ICD-10-CM | POA: Diagnosis not present

## 2017-06-16 DIAGNOSIS — M8589 Other specified disorders of bone density and structure, multiple sites: Secondary | ICD-10-CM

## 2017-06-16 LAB — LIPID PANEL
CHOL/HDL RATIO: 2
Cholesterol: 165 mg/dL (ref 0–200)
HDL: 72.4 mg/dL (ref >39.00)
LDL CALC: 64 mg/dL (ref 0–99)
NONHDL: 92.73
Triglycerides: 145 mg/dL (ref 0.0–149.0)
VLDL: 29 mg/dL (ref 0.0–40.0)

## 2017-06-16 LAB — COMPREHENSIVE METABOLIC PANEL
ALT: 9 U/L (ref 0–35)
AST: 11 U/L (ref 0–37)
Albumin: 4.1 g/dL (ref 3.5–5.2)
Alkaline Phosphatase: 64 U/L (ref 39–117)
BUN: 21 mg/dL (ref 6–23)
CHLORIDE: 105 meq/L (ref 96–112)
CO2: 28 meq/L (ref 19–32)
CREATININE: 1.02 mg/dL (ref 0.40–1.20)
Calcium: 9.6 mg/dL (ref 8.4–10.5)
GFR: 56.22 mL/min — ABNORMAL LOW (ref >60.00)
Glucose, Bld: 93 mg/dL (ref 70–99)
POTASSIUM: 5.5 meq/L — AB (ref 3.5–5.1)
SODIUM: 141 meq/L (ref 135–145)
Total Bilirubin: 0.4 mg/dL (ref 0.2–1.2)
Total Protein: 6.5 g/dL (ref 6.0–8.3)

## 2017-06-16 LAB — VITAMIN D 25 HYDROXY (VIT D DEFICIENCY, FRACTURES): VITD: 47.11 ng/mL (ref 30.00–100.00)

## 2017-06-16 NOTE — Telephone Encounter (Addendum)
Labs faxed to Dr. Cherylann Ratel at [phone] as requested.

## 2017-06-16 NOTE — Patient Instructions (Addendum)
Natasha Sawyer , Thank you for taking time to come for your Medicare Wellness Visit. I appreciate your ongoing commitment to your health goals. Please review the following plan we discussed and let me know if I can assist you in the future.   These are the goals we discussed: Goals    . physical          Starting 06/16/2017, I will continue to walk 1-2 miles daily.        This is a list of the screening recommended for you and due dates:  Health Maintenance  Topic Date Due  . Flu Shot  12/14/2017*  . Pneumonia vaccines (1 of 2 - PCV13) 12/14/2017*  . Mammogram  02/18/2018  . Cologuard (Stool DNA test)  07/18/2019  . Tetanus Vaccine  02/11/2021  . DEXA scan (bone density measurement)  Completed  *Topic was postponed. The date shown is not the original due date.     Preventive Care for Adults  A healthy lifestyle and preventive care can promote health and wellness. Preventive health guidelines for adults include the following key practices.  . A routine yearly physical is a good way to check with your health care provider about your health and preventive screening. It is a chance to share any concerns and updates on your health and to receive a thorough exam.  . Visit your dentist for a routine exam and preventive care every 6 months. Brush your teeth twice a day and floss once a day. Good oral hygiene prevents tooth decay and gum disease.  . The frequency of eye exams is based on your age, health, family medical history, use  of contact lenses, and other factors. Follow your health care provider's ecommendations for frequency of eye exams.  . Eat a healthy diet. Foods like vegetables, fruits, whole grains, low-fat dairy products, and lean protein foods contain the nutrients you need without too many calories. Decrease your intake of foods high in solid fats, added sugars, and salt. Eat the right amount of calories for you. Get information about a proper diet from your health care  provider, if necessary.  . Regular physical exercise is one of the most important things you can do for your health. Most adults should get at least 150 minutes of moderate-intensity exercise (any activity that increases your heart rate and causes you to sweat) each week. In addition, most adults need muscle-strengthening exercises on 2 or more days a week.  Silver Sneakers may be a benefit available to you. To determine eligibility, you may visit the website: www.silversneakers.com or contact program at 562-323-4753 Mon-Fri between 8AM-8PM.   . Maintain a healthy weight. The body mass index (BMI) is a screening tool to identify possible weight problems. It provides an estimate of body fat based on height and weight. Your health care provider can find your BMI and can help you achieve or maintain a healthy weight.   For adults 20 years and older: ? A BMI below 18.5 is considered underweight. ? A BMI of 18.5 to 24.9 is normal. ? A BMI of 25 to 29.9 is considered overweight. ? A BMI of 30 and above is considered obese.   . Maintain normal blood lipids and cholesterol levels by exercising and minimizing your intake of saturated fat. Eat a balanced diet with plenty of fruit and vegetables. Blood tests for lipids and cholesterol should begin at age 29 and be repeated every 5 years. If your lipid or cholesterol levels are high, you  are over 26, or you are at high risk for heart disease, you may need your cholesterol levels checked more frequently. Ongoing high lipid and cholesterol levels should be treated with medicines if diet and exercise are not working.  . If you smoke, find out from your health care provider how to quit. If you do not use tobacco, please do not start.  . If you choose to drink alcohol, please do not consume more than 2 drinks per day. One drink is considered to be 12 ounces (355 mL) of beer, 5 ounces (148 mL) of wine, or 1.5 ounces (44 mL) of liquor.  . If you are 79-79 years  old, ask your health care provider if you should take aspirin to prevent strokes.  . Use sunscreen. Apply sunscreen liberally and repeatedly throughout the day. You should seek shade when your shadow is shorter than you. Protect yourself by wearing long sleeves, pants, a wide-brimmed hat, and sunglasses year round, whenever you are outdoors.  . Once a month, do a whole body skin exam, using a mirror to look at the skin on your back. Tell your health care provider of new moles, moles that have irregular borders, moles that are larger than a pencil eraser, or moles that have changed in shape or color.

## 2017-06-16 NOTE — Telephone Encounter (Signed)
Pt left v/m that she had labs done at [facility] on 06/16/17 and request lab results faxed to Dr [facility] fax # [phone]. Pt will see Dr Cherylann Ratel on 06/20/17.

## 2017-06-16 NOTE — Progress Notes (Signed)
Pre visit review using our clinic review tool, if applicable. No additional management support is needed unless otherwise documented below in the visit note. 

## 2017-06-16 NOTE — Progress Notes (Signed)
Subjective:   Natasha Sawyer is a 76 y.o. female who presents for Medicare Annual (Subsequent) preventive examination.  Review of Systems:  N/A Cardiac Risk Factors include: advanced age (>28men, >74 women);dyslipidemia;hypertension;obesity (BMI >30kg/m2)     Objective:     Vitals: BP 106/72 (BP Location: Right Arm, Patient Position: Sitting, Cuff Size: Normal)   Pulse (!) 54   Temp 97.7 F (36.5 C) (Oral)   Ht 5' (1.524 m) Comment: no shoes  Wt 160 lb 8 oz (72.8 kg)   SpO2 99%   BMI 31.35 kg/m   Body mass index is 31.35 kg/m.   Tobacco History  Smoking Status  . Never Smoker  Smokeless Tobacco  . Never Used     Counseling given: No   Past Medical History:  Diagnosis Date  . Chronic kidney disease   . Hyperlipidemia   . Hypertension   . Premature beats    Past Surgical History:  Procedure Laterality Date  . APPENDECTOMY    . CESAREAN SECTION    . EYE SURGERY     cataract and eyelid  . HERNIA REPAIR     Family History  Problem Relation Age of Onset  . Diabetes Mother   . Stroke Father   . Breast cancer Cousin 50       maternal   History  Sexual Activity  . Sexual activity: No    Outpatient Encounter Prescriptions as of 06/16/2017  Medication Sig  . Acetaminophen (TYLENOL ARTHRITIS PAIN PO) Take 650 mg by mouth daily.  Marland Kitchen amLODipine (NORVASC) 10 MG tablet Take 10 mg by mouth daily.  . enalapril (VASOTEC) 20 MG tablet Take 20 mg by mouth daily.  . Ergocalciferol (VITAMIN D2) 2000 UNITS TABS Take 1 tablet by mouth daily.  Marland Kitchen ezetimibe-simvastatin (VYTORIN) 10-20 MG tablet Take 1 tablet by mouth at bedtime.  Marland Kitchen zolpidem (AMBIEN) 5 MG tablet Take 2.5 mg by mouth at bedtime as needed.    No facility-administered encounter medications on file as of 06/16/2017.     Activities of Daily Living In your present state of health, do you have any difficulty performing the following activities: 06/16/2017  Hearing? N  Vision? N  Difficulty concentrating or  making decisions? N  Walking or climbing stairs? N  Dressing or bathing? N  Doing errands, shopping? N  Preparing Food and eating ? N  Using the Toilet? N  In the past six months, have you accidently leaked urine? N  Do you have problems with loss of bowel control? N  Managing your Medications? N  Managing your Finances? N  Housekeeping or managing your Housekeeping? N  Some recent data might be hidden    Patient Care Team: Excell Seltzer, MD as PCP - General Cherylann Ratel Munsoor, MD as Consulting Physician (Internal Medicine)    Assessment:     Hearing Screening             Right ear:   40 40 40  0    Left ear:   0 0 40  0      Visual Acuity Screening   Right eye Left eye Both eyes  Without correction:  With correction:       Exercise Activities and Dietary recommendations Current Exercise Habits: Home exercise routine, Type of exercise: walking, Frequency (Times/Week): 7, Exercise limited by: None identified  Goals    . physical          Starting 06/16/2017, I will continue to  walk 1-2 miles daily.       Fall Risk Fall Risk  06/16/2017 06/12/2016  Falls in the past year? Yes No  Comment pt lost balance and fell out of bathroom door; injury to head. no LOC or medical treatment -  Number falls in past yr: 1 -  Injury with Fall? Yes -   Depression Screen PHQ 2/9 Scores 06/16/2017 06/12/2016  PHQ - 2 Score 0 0  PHQ- 9 Score 0 -     Cognitive Function MMSE - Mini Mental State Exam 06/16/2017 06/12/2016  Orientation to time 5 5  Orientation to Place 5 5  Registration 3 3  Attention/ Calculation 0 0  Recall 3 3  Language- name 2 objects 0 0  Language- repeat 1 1  Language- follow 3 step command 3 3  Language- read & follow direction 0 0  Write a sentence 0 0  Copy design 0 0  Total score 20 20     PLEASE NOTE: A Mini-Cog screen was completed. Maximum score is 20. A value of 0 denotes this part of  Folstein MMSE was not completed or the patient failed this part of the Mini-Cog screening.   Mini-Cog Screening Orientation to Time - Max 5 pts Orientation to Place - Max 5 pts Registration - Max 3 pts Recall - Max 3 pts Language Repeat - Max 1 pts Language Follow 3 Step Command - Max 3 pts     Immunization History  Administered Date(s) Administered  . Influenza-Unspecified 06/16/2013  . Zoster 02/13/2012   Screening Tests Health Maintenance  Topic Date Due  . INFLUENZA VACCINE  12/14/2017 (Originally 04/16/2017)  . PNA vac Low Risk Adult (1 of 2 - PCV13) 12/14/2017 (Originally 11/25/2007)  . MAMMOGRAM  02/18/2018  . Fecal DNA (Cologuard)  07/18/2019  . TETANUS/TDAP  02/11/2021  . DEXA SCAN  Completed      Plan:     I have personally reviewed and addressed the Medicare Annual Wellness questionnaire and have noted the following in the patient's chart:  A. Medical and social history B. Use of alcohol, tobacco or illicit drugs  C. Current medications and supplements D. Functional ability and status E.  Nutritional status F.  Physical activity G. Advance directives H. List of other physicians I.  Hospitalizations, surgeries, and ER visits in previous 12 months J.  Vitals K. Screenings to include hearing, vision, cognitive, depression L. Referrals and appointments - none  In addition, I have reviewed and discussed with patient certain preventive protocols, quality metrics, and best practice recommendations. A written personalized care plan for preventive services as well as general preventive health recommendations were provided to patient.  See attached scanned questionnaire for additional information.   Signed,   Randa Evens, MHA, BS, LPN Health Coach

## 2017-06-16 NOTE — Progress Notes (Signed)
PCP notes:   Health maintenance:  Flu vaccine - addressed; pt will get vaccine at [facility] PNA vaccine - addressed  Abnormal screenings:   Fall risk - hx of fall with injury but no medical treatment Hearing - failed  Hearing Screening             Right ear:   40 40 40  0    Left ear:   0 0 40  0     Patient concerns:   None  Nurse concerns:  None  Next PCP appt:   06/27/17 @ 1530

## 2017-06-17 ENCOUNTER — Telehealth: Payer: Self-pay | Admitting: Family Medicine

## 2017-06-17 DIAGNOSIS — N2581 Secondary hyperparathyroidism of renal origin: Secondary | ICD-10-CM | POA: Diagnosis not present

## 2017-06-17 DIAGNOSIS — I10 Essential (primary) hypertension: Secondary | ICD-10-CM | POA: Diagnosis not present

## 2017-06-17 DIAGNOSIS — N183 Chronic kidney disease, stage 3 (moderate): Secondary | ICD-10-CM | POA: Diagnosis not present

## 2017-06-17 MED ORDER — SODIUM POLYSTYRENE SULFONATE PO POWD: ORAL | 0 refills | Status: DC

## 2017-06-17 MED ORDER — FUROSEMIDE 20 MG PO TABS: ORAL_TABLET | ORAL | 0 refills | Status: DC

## 2017-06-17 NOTE — Telephone Encounter (Signed)
Call pt .Marland Kitchen Have her take lasix low dose x 2 days to lower potassium. Sent in rx.

## 2017-06-17 NOTE — Telephone Encounter (Signed)
Left message for Natasha Sawyer to return my call.

## 2017-06-17 NOTE — Telephone Encounter (Signed)
Potassium is too high.. Stop andy high potassium foods, supplements etc.   Increase water.  I will send in a prescription for Kayexalate x 2 doses.

## 2017-06-17 NOTE — Addendum Note (Signed)
Addended by: Kerby Nora E on: 06/17/2017 04:39 PM   Modules accepted: Orders

## 2017-06-17 NOTE — Addendum Note (Signed)
Addended by: Damita Lack on: 06/17/2017 11:39 AM   Modules accepted: Orders

## 2017-06-17 NOTE — Telephone Encounter (Signed)
Call patient back at [phone].

## 2017-06-17 NOTE — Telephone Encounter (Signed)
Patient went to the pharmacy to pick up rx.  Patient was told by the pharmacy the Kayexalate is unavailable and they tried other pharmacies and they didn't have it.  They don't have the powder or liquid.  The pharmacy suggested patient call PCP and ask for a different rx.

## 2017-06-17 NOTE — Progress Notes (Signed)
I reviewed health advisor's note, was available for consultation, and agree with documentation and plan.   Signed,  Nikisha Fleece T. Dewane Timson, MD  

## 2017-06-17 NOTE — Telephone Encounter (Signed)
Natasha Sawyer notified as instructed by telephone.  She ask that we resend Rx to [facility] [address]  Rx cancelled at [facility] and resent to [facility] as requested.

## 2017-06-17 NOTE — Telephone Encounter (Signed)
Caller Name:Cyenna Milles Relationship to Patient:self Best number:[phone] Pharmacy:  Reason for call: pt returned your call

## 2017-06-17 NOTE — Telephone Encounter (Signed)
Mrs. Caccavale notified as instructed by telephone.

## 2017-06-18 ENCOUNTER — Telehealth: Payer: Self-pay | Admitting: Family Medicine

## 2017-06-18 NOTE — Telephone Encounter (Signed)
Natasha Sawyer notified copy of her lab results are ready to be picked up at the front desk.

## 2017-06-18 NOTE — Telephone Encounter (Signed)
Best number [phone]   Pt called wanting to get a copy of her labs.  She has an appointment with another dr Friday 10/5 and wanted to take them with her  Pt is wanting to get the results also.

## 2017-06-20 DIAGNOSIS — E875 Hyperkalemia: Secondary | ICD-10-CM | POA: Diagnosis not present

## 2017-06-20 DIAGNOSIS — I10 Essential (primary) hypertension: Secondary | ICD-10-CM | POA: Diagnosis not present

## 2017-06-20 DIAGNOSIS — N183 Chronic kidney disease, stage 3 (moderate): Secondary | ICD-10-CM | POA: Diagnosis not present

## 2017-06-20 DIAGNOSIS — N2581 Secondary hyperparathyroidism of renal origin: Secondary | ICD-10-CM | POA: Diagnosis not present

## 2017-06-24 ENCOUNTER — Ambulatory Visit: Payer: PRIVATE HEALTH INSURANCE | Admitting: Family Medicine

## 2017-06-27 ENCOUNTER — Encounter: Payer: Self-pay | Admitting: Family Medicine

## 2017-06-27 ENCOUNTER — Ambulatory Visit (INDEPENDENT_AMBULATORY_CARE_PROVIDER_SITE_OTHER): Payer: Medicare Other | Admitting: Family Medicine

## 2017-06-27 VITALS — BP 120/80 | HR 58 | Temp 97.5°F | Ht 60.0 in | Wt 161.0 lb

## 2017-06-27 DIAGNOSIS — N183 Chronic kidney disease, stage 3 unspecified: Secondary | ICD-10-CM

## 2017-06-27 DIAGNOSIS — Z0001 Encounter for general adult medical examination with abnormal findings: Secondary | ICD-10-CM | POA: Diagnosis not present

## 2017-06-27 DIAGNOSIS — I10 Essential (primary) hypertension: Secondary | ICD-10-CM | POA: Diagnosis not present

## 2017-06-27 DIAGNOSIS — E782 Mixed hyperlipidemia: Secondary | ICD-10-CM

## 2017-06-27 DIAGNOSIS — E785 Hyperlipidemia, unspecified: Secondary | ICD-10-CM

## 2017-06-27 DIAGNOSIS — E875 Hyperkalemia: Secondary | ICD-10-CM

## 2017-06-27 DIAGNOSIS — Z0000 Encounter for general adult medical examination without abnormal findings: Secondary | ICD-10-CM

## 2017-06-27 NOTE — Assessment & Plan Note (Signed)
Improved control on vytorin.

## 2017-06-27 NOTE — Patient Instructions (Addendum)
Please stop at the lab to have labs drawn.  If your potassium remains high.. We will send a note to Dr. Margarette Asal consider a potassium binder like veltassa.  [employer].  Try to stop Ambien and change to melatonin 3 mg.

## 2017-06-27 NOTE — Assessment & Plan Note (Signed)
Stable , followed by D.rlateef.

## 2017-06-27 NOTE — Assessment & Plan Note (Signed)
Well controlled. Continue current medication.  

## 2017-06-27 NOTE — Progress Notes (Signed)
Subjective:    Patient ID: Natasha Sawyer, female    DOB: 1941/07/30, 76 y.o.   MRN: [id number]  HPI The patient presents for complete physical and review of chronic health problems. He/She also has the following acute concerns today: fatigue .Marland Kitchen Takes ambeine for insomnia.. May be building up in system and coasung early day fatigue.. Better later in day.   The patient saw Lu Duffel, LPN for medicare wellness. Note reviewed in detail and important notes copied below.  Health maintenance: Flu vaccine - addressed; pt will get vaccine at [facility] PNA vaccine - addressed Abnormal screenings:  Fall risk - hx of fall with injury but no medical treatment Hearing - failed             Hearing Screening             Right ear:   40 40 40  0    Left ear:   0 0 40  0    Patient concerns:  None  Hypertension:   At goal on enalapril, amlodipine  Using medication without problems or lightheadedness:  none Chest pain with exertion: Edema: off and on when sitting a while Short of breath: none Average home BPs: Other issues:  Elevated Cholesterol: At goal on Vytorin Lab Results  Component Value Date   CHOL 165 06/16/2017   HDL 72.40 06/16/2017   LDLCALC 64 06/16/2017   TRIG 145.0 06/16/2017   CHOLHDL 2 06/16/2017  Using medications without problems: Muscle aches:  Diet compliance: moderate Exercise: walks Other complaints:   Took lasix for 2 days.. Minimal change in urination.  CKD;  Stable Cr, GFR 56 followed by nephrology, Dr. Cherylann Ratel   Potassium 5.5    Social History /Family History/Past Medical History reviewed in detail and updated in EMR if needed. Blood pressure 120/80, pulse (!) 58, temperature (!) 97.5 F (36.4 C), temperature source Oral, height 5' (1.524 m), weight 161 lb (73 kg).  Review of Systems  Constitutional: Positive for fatigue. Negative for fever.  HENT: Negative for congestion and  ear pain.   Eyes: Negative for pain.  Respiratory: Negative for cough, chest tightness and shortness of breath.   Cardiovascular: Negative for chest pain, palpitations and leg swelling.  Gastrointestinal: Negative for abdominal pain.  Genitourinary: Negative for dysuria and vaginal bleeding.  Musculoskeletal: Negative for back pain.  Neurological: Negative for syncope, light-headedness and headaches.  Psychiatric/Behavioral: Negative for dysphoric mood.       Objective:   Physical Exam  Constitutional: Vital signs are normal. She appears well-developed and well-nourished. She is cooperative.  Non-toxic appearance. She does not appear ill. No distress.  Central obesity  HENT:  Head: Normocephalic.  Right Ear: Hearing, tympanic membrane, external ear and ear canal normal.  Left Ear: Hearing, tympanic membrane, external ear and ear canal normal.  Nose: Nose normal.  Eyes: Pupils are equal, round, and reactive to light. Conjunctivae, EOM and lids are normal. Lids are everted and swept, no foreign bodies found.  Neck: Trachea normal and normal range of motion. Neck supple. Carotid bruit is not present. No thyroid mass and no thyromegaly present.  Cardiovascular: Normal rate, regular rhythm, S1 normal, S2 normal, normal heart sounds and intact distal pulses.  Exam reveals no gallop.   No murmur heard. Pulmonary/Chest: Effort normal and breath sounds normal. No respiratory distress. She has no wheezes. She has no rhonchi. She has no rales.  Abdominal: Soft. Normal appearance and bowel sounds are normal. She exhibits no  distension, no fluid wave, no abdominal bruit and no mass. There is no hepatosplenomegaly. There is no tenderness. There is no rebound, no guarding and no CVA tenderness. No hernia.  Lymphadenopathy:    She has no cervical adenopathy.    She has no axillary adenopathy.  Neurological: She is alert. She has normal strength. No cranial nerve deficit or sensory deficit.  Skin: Skin  is warm, dry and intact. No rash noted.  Psychiatric: Her speech is normal and behavior is normal. Judgment normal. Her mood appears not anxious. Cognition and memory are normal. She does not exhibit a depressed mood.          Assessment & Plan:  The patient's preventative maintenance and recommended screening tests for an annual wellness exam were reviewed in full today. Brought up to date unless services declined.  Counselled on the importance of diet, exercise, and its role in overall health and mortality. The patient's FH and SH was reviewed, including their home life, tobacco status, and drug and alcohol status.   Mammo: 02/2017 PAP/DVE: last pap 2012 nml, okay with stopping these., asymptomatic, no family history of ovarian or uterine cancer: WILL STOP. Colon: last done many years ago 2006 : nml,  On cologuard. Plan repeat 2020 Nonsmoker Flu vaccine - pt takes vaccine at [facility] PNA vaccines - pt reports she may have taken these at [facility]; pt will obtain record from [facility] to confirm Bone density - 02/2017

## 2017-06-27 NOTE — Assessment & Plan Note (Signed)
Likely due to CKD and ACEI.. May need potassium binder if not coming down. Forward labs to [facility].

## 2017-06-30 ENCOUNTER — Other Ambulatory Visit: Payer: Self-pay | Admitting: Family Medicine

## 2017-06-30 NOTE — Addendum Note (Signed)
Addended by: Alvina Chou on: 06/30/2017 08:22 AM   Modules accepted: Orders

## 2017-07-03 ENCOUNTER — Other Ambulatory Visit (INDEPENDENT_AMBULATORY_CARE_PROVIDER_SITE_OTHER): Payer: Medicare Other

## 2017-07-03 DIAGNOSIS — E875 Hyperkalemia: Secondary | ICD-10-CM

## 2017-07-03 LAB — POTASSIUM: POTASSIUM: 4.8 meq/L (ref 3.5–5.1)

## 2017-07-10 DIAGNOSIS — Z23 Encounter for immunization: Secondary | ICD-10-CM | POA: Diagnosis not present

## 2017-09-12 ENCOUNTER — Ambulatory Visit: Payer: Self-pay | Admitting: *Deleted

## 2017-09-12 NOTE — Telephone Encounter (Signed)
Pt called with complaints of coughing; she went to nurse at [facility] and was having rattling and fluid on right lung and has a fever of 10; recommendation made per protocol that pt see a physician within 4 hours; pt would like to be [facility] or by [facility] Dayton MartesAron at [facility]; no appointments availalbl; pt verbalizes that she will proceed to urgent care.  Reason for Disposition . [1] Fever > 100.0 F (37.8 C) AND [2] bedridden (e.g., nursing home patient, CVA, chronic illness, recovering from surgery)  Answer Assessment - Initial Assessment Questions 1. ONSET: "When did the cough begin?"      09/08/17 2. SEVERITY: "How bad is the cough today?"      moderate 3. RESPIRATORY DISTRESS: "Describe your breathing."      Nurse at [facility] says she has a rattle in her chest and fluid on her right lung 4. FEVER: "Do you have a fever?" If so, ask: "What is your temperature, how was it measured, and when did it start?"     101 with forehead thermometer 5. HEMOPTYSIS: "Are you coughing up any blood?" If so ask: "How much?" (flecks, streaks, tablespoons, etc.)     no 6. TREATMENT: "What have you done so far to treat the cough?" (e.g., meds, fluids, humidifier)     no 7. CARDIAC HISTORY: "Do you have any history of heart disease?" (e.g., heart attack, congestive heart failure)      Irregular heart beat 8. LUNG HISTORY: "Do you have any history of lung disease?"  (e.g., pulmonary embolus, asthma, emphysema)     no 9. PE RISK FACTORS: "Do you have a history of blood clots?" (or: recent major surgery, recent prolonged travel, bedridden )     no 10. OTHER SYMPTOMS: "Do you have any other symptoms? (e.g., runny nose, wheezing, chest pain)       Rattling chest, wheezing, small amount of shortness of breath with exertion 11. PREGNANCY: "Is there any chance you are pregnant?" "When was your last menstrual period?"       no 12. TRAVEL: "Have you traveled out of the country in the last month?" (e.g., travel  history, exposures)       no  Protocols used: COUGH - ACUTE NON-PRODUCTIVE-A-AH

## 2017-09-23 ENCOUNTER — Telehealth: Payer: Self-pay | Admitting: Family Medicine

## 2017-09-23 ENCOUNTER — Ambulatory Visit (INDEPENDENT_AMBULATORY_CARE_PROVIDER_SITE_OTHER)
Admission: RE | Admit: 2017-09-23 | Discharge: 2017-09-23 | Disposition: A | Discharge status: Home / Self Care | Payer: Medicare PPO | Source: Ambulatory Visit | Attending: Family Medicine | Admitting: Family Medicine

## 2017-09-23 ENCOUNTER — Other Ambulatory Visit: Payer: Self-pay

## 2017-09-23 ENCOUNTER — Encounter: Payer: Self-pay | Admitting: Family Medicine

## 2017-09-23 ENCOUNTER — Ambulatory Visit (INDEPENDENT_AMBULATORY_CARE_PROVIDER_SITE_OTHER): Payer: Medicare PPO | Admitting: Family Medicine

## 2017-09-23 DIAGNOSIS — N183 Chronic kidney disease, stage 3 unspecified: Secondary | ICD-10-CM

## 2017-09-23 DIAGNOSIS — R05 Cough: Secondary | ICD-10-CM

## 2017-09-23 DIAGNOSIS — R053 Chronic cough: Secondary | ICD-10-CM | POA: Insufficient documentation

## 2017-09-23 DIAGNOSIS — R918 Other nonspecific abnormal finding of lung field: Secondary | ICD-10-CM

## 2017-09-23 NOTE — Patient Instructions (Signed)
We will call with X-ray results l as needed at night.. later today.  Can increase benzonatate to twice daily.  Can also use mucinex plain, or DM, no decongestant.

## 2017-09-23 NOTE — Telephone Encounter (Signed)
rec'd call [facility] from [facility].  Will make Dr. Ermalene SearingBedsole aware of CXR result in Epic.

## 2017-09-23 NOTE — Telephone Encounter (Signed)
Pt notified of findings. Please call when Chest CT scheduled.

## 2017-09-23 NOTE — Telephone Encounter (Signed)
Copy of report taken to Dr Daphine DeutscherBedsole's area.

## 2017-09-23 NOTE — Assessment & Plan Note (Signed)
Likely due to post inflammatory bronchospasm but  Given persistence and age... eval with  CXR.

## 2017-09-23 NOTE — Telephone Encounter (Signed)
Lamar Blinksarol Pullins RN also noted;   Please make Dr. Ermalene SearingBedsole aware that pt's CXR results are in Epic.

## 2017-09-23 NOTE — Progress Notes (Signed)
   Subjective:    Patient ID: Natasha KennerSusan D Sawyer, female    DOB: 29-Mar-1941, 77 y.o.   MRN: [id number]  Cough  This is a new problem. The current episode started 1 to 4 weeks ago (10 days ago). The problem has been gradually worsening ( She was unable to go in.. saw Daughter  who PA.. upper respiratory). The problem occurs constantly. The cough is productive of sputum. Pertinent negatives include no chest pain, chills, ear congestion, ear pain, fever, hemoptysis, shortness of breath or wheezing. Associated symptoms comments:  cough lingering  tired  . Risk factors: nonsmoker. She has tried prescription cough suppressant ( treated with antibitoics 10 day  of levaquin felt better but lingering cough) for the symptoms. The treatment provided moderate relief. There is no history of asthma, bronchitis, COPD or environmental allergies.      Review of Systems  Constitutional: Negative for chills, fatigue and fever.  HENT: Negative for ear pain.   Eyes: Negative for pain.  Respiratory: Positive for cough. Negative for hemoptysis, chest tightness, shortness of breath and wheezing.   Cardiovascular: Negative for chest pain, palpitations and leg swelling.  Gastrointestinal: Negative for abdominal pain.  Genitourinary: Negative for dysuria.  Allergic/Immunologic: Negative for environmental allergies.       Objective:   Physical Exam  Constitutional: Vital signs are normal. She appears well-developed and well-nourished. She is cooperative.  Non-toxic appearance. She does not appear ill. No distress.  HENT:  Head: Normocephalic.  Right Ear: Hearing, tympanic membrane, external ear and ear canal normal. Tympanic membrane is not erythematous, not retracted and not bulging.  Left Ear: Hearing, tympanic membrane, external ear and ear canal normal. Tympanic membrane is not erythematous, not retracted and not bulging.  Nose: Mucosal edema and rhinorrhea present. Right sinus exhibits no maxillary sinus  tenderness and no frontal sinus tenderness. Left sinus exhibits no maxillary sinus tenderness and no frontal sinus tenderness.  Mouth/Throat: Uvula is midline, oropharynx is clear and moist and mucous membranes are normal.  Eyes: Conjunctivae, EOM and lids are normal. Pupils are equal, round, and reactive to light. Lids are everted and swept, no foreign bodies found.  Neck: Trachea normal and normal range of motion. Neck supple. Carotid bruit is not present. No thyroid mass and no thyromegaly present.  Cardiovascular: Normal rate, regular rhythm, S1 normal, S2 normal, normal heart sounds, intact distal pulses and normal pulses. Exam reveals no gallop and no friction rub.  No murmur heard. Pulmonary/Chest: Effort normal and breath sounds normal. No tachypnea. No respiratory distress. She has no decreased breath sounds. She has no wheezes. She has no rhonchi. She has no rales.  Neurological: She is alert.  Skin: Skin is warm, dry and intact. No rash noted.  Psychiatric: Her speech is normal and behavior is normal. Judgment normal. Her mood appears not anxious. Cognition and memory are normal. She does not exhibit a depressed mood.          Assessment & Plan:

## 2017-09-25 NOTE — Telephone Encounter (Signed)
CT scheduled and patient aware. °

## 2017-09-30 ENCOUNTER — Telehealth: Payer: Self-pay | Admitting: Family Medicine

## 2017-09-30 DIAGNOSIS — N183 Chronic kidney disease, stage 3 unspecified: Secondary | ICD-10-CM

## 2017-09-30 NOTE — Telephone Encounter (Addendum)
Ms. Haskell RilingRhyne notified as instructed by telephone.  She asked what Dr. Ermalene SearingBedsole meant by kidney irritation.  She then ask if I would call her daughter, Gerre PebblesSally Davis [phone]), who is a PA and explain all this to her.  She also ask that we include Dr. Cherylann RatelLateef in all of this.  I have left a voicemail for Kennon RoundsSally to return my call.  Will have Dr. Ermalene SearingBedsole speak with her when she calls back.

## 2017-09-30 NOTE — Telephone Encounter (Signed)
[name] notified as instructed by telephone.  Lab appointment scheduled for 10/01/17 at 9:20 am for BMET.

## 2017-09-30 NOTE — Telephone Encounter (Signed)
06/2017 GFR 56.  As requested by daughter...  Please have pt come in for repeat BMET to reassess GFR. ASAP.   Send results of GFR to Dr. Cherylann RatelLateef her nephrologist.. If he okays CT with CONTRAST to assess abnornmal CXR.Marland Kitchen. We will proceed.  Hold current  Non-contrast CT on 1/18 for now.    CC: Dr. Cherylann RatelLateef

## 2017-09-30 NOTE — Telephone Encounter (Signed)
Call pt to find out her concerns.    It sounds like she wants to change to CT to with contrast... I have ordered without contrast given her decreased kidney function. However, the threshold GFR at which a clinically significant risk is incurred is not well defined. She is borderline. A WITH contrast CT would show more about the change in her lung .Marland Kitchen. If she is agreeable to possible increase risk of kidney irritation.. I can change to WITH contrast.

## 2017-09-30 NOTE — Telephone Encounter (Signed)
Copied from CRM [phone]. Topic: Quick Communication - See Telephone Encounter >> Sep 30, 2017 11:43 AM Rudi CocoLathan, Arlenne Kimbley M, NT wrote: CRM for notification. See Telephone encounter for:   09/30/17. Pt. Calling wanting to speak with nurse or Dr. Ermalene SearingBedsole about ct scan pt. Talked with daughter who is a PA and pt wants to know if she can have a different type of  CT scan (contrast) asking Dr. Ermalene SearingBedsole or nurse to give pt. A call [phone].

## 2017-10-01 ENCOUNTER — Other Ambulatory Visit (INDEPENDENT_AMBULATORY_CARE_PROVIDER_SITE_OTHER): Payer: Medicare PPO

## 2017-10-01 DIAGNOSIS — N183 Chronic kidney disease, stage 3 unspecified: Secondary | ICD-10-CM

## 2017-10-01 LAB — BASIC METABOLIC PANEL
BUN: 17 mg/dL (ref 6–23)
CALCIUM: 9.3 mg/dL (ref 8.4–10.5)
CO2: 30 mEq/L (ref 19–32)
CREATININE: 1.03 mg/dL (ref 0.40–1.20)
Chloride: 105 mEq/L (ref 96–112)
GFR: 55.54 mL/min — ABNORMAL LOW (ref >60.00)
Glucose, Bld: 134 mg/dL — ABNORMAL HIGH (ref 70–99)
Potassium: 4.6 mEq/L (ref 3.5–5.1)
Sodium: 140 mEq/L (ref 135–145)

## 2017-10-02 NOTE — Telephone Encounter (Signed)
[facility] Cherylann RatelLateef said OK if using low dose protocol. [facility] to [facility]MA.

## 2017-10-02 NOTE — Telephone Encounter (Signed)
Order changed in Epic from CT w/o contrast to CT w/contrast.  Shirlee LimerickMarion notified and she will work on getting the appointment changed and insurance approved for CT with contrast.  Ms. Haskell RilingRhyne notified by telephone.  She will [facility] with [facility].

## 2017-10-02 NOTE — Addendum Note (Signed)
Addended by: Damita LackLORING, Amani Marseille S on: 10/02/2017 04:35 PM   Modules accepted: Orders

## 2017-10-02 NOTE — Addendum Note (Signed)
Addended by: Damita LackLORING, DONNA S on: 10/02/2017 04:17 PM   Modules accepted: Orders

## 2017-10-03 ENCOUNTER — Telehealth: Payer: Self-pay | Admitting: Family Medicine

## 2017-10-03 ENCOUNTER — Ambulatory Visit
Admission: RE | Admit: 2017-10-03 | Discharge: 2017-10-03 | Disposition: A | Discharge status: Home / Self Care | Payer: Medicare PPO | Source: Ambulatory Visit | Attending: Family Medicine | Admitting: Family Medicine

## 2017-10-03 ENCOUNTER — Ambulatory Visit: Admission: RE | Admit: 2017-10-03 | Payer: Medicare PPO | Source: Ambulatory Visit

## 2017-10-03 DIAGNOSIS — I700 Atherosclerosis of aorta: Secondary | ICD-10-CM | POA: Insufficient documentation

## 2017-10-03 DIAGNOSIS — R918 Other nonspecific abnormal finding of lung field: Secondary | ICD-10-CM

## 2017-10-03 MED ORDER — IOPAMIDOL (ISOVUE-300) INJECTION 61%
75.0000 mL | Freq: Once | INTRAVENOUS | Status: AC | PRN
  Administered 2017-10-03: 75 mL via INTRAVENOUS

## 2017-10-03 NOTE — Telephone Encounter (Signed)
Copied from CRM [phone]. Topic: General - Other >> Oct 03, 2017  3:45 PM Gerrianne ScalePayne, Meesha Sek L wrote: Reason for CRM: patient calling to get CT scan report that was done today

## 2017-10-23 DIAGNOSIS — N183 Chronic kidney disease, stage 3 (moderate): Secondary | ICD-10-CM | POA: Diagnosis not present

## 2017-10-23 DIAGNOSIS — N2581 Secondary hyperparathyroidism of renal origin: Secondary | ICD-10-CM | POA: Diagnosis not present

## 2017-10-23 DIAGNOSIS — R809 Proteinuria, unspecified: Secondary | ICD-10-CM | POA: Diagnosis not present

## 2017-10-23 DIAGNOSIS — I10 Essential (primary) hypertension: Secondary | ICD-10-CM | POA: Diagnosis not present

## 2018-01-07 ENCOUNTER — Other Ambulatory Visit: Payer: Self-pay | Admitting: Family Medicine

## 2018-01-07 DIAGNOSIS — Z1231 Encounter for screening mammogram for malignant neoplasm of breast: Secondary | ICD-10-CM

## 2018-02-16 DIAGNOSIS — N2581 Secondary hyperparathyroidism of renal origin: Secondary | ICD-10-CM | POA: Diagnosis not present

## 2018-02-16 DIAGNOSIS — I10 Essential (primary) hypertension: Secondary | ICD-10-CM | POA: Diagnosis not present

## 2018-02-16 DIAGNOSIS — N183 Chronic kidney disease, stage 3 (moderate): Secondary | ICD-10-CM | POA: Diagnosis not present

## 2018-02-19 DIAGNOSIS — I10 Essential (primary) hypertension: Secondary | ICD-10-CM | POA: Diagnosis not present

## 2018-02-19 DIAGNOSIS — N183 Chronic kidney disease, stage 3 (moderate): Secondary | ICD-10-CM | POA: Diagnosis not present

## 2018-02-19 DIAGNOSIS — N2581 Secondary hyperparathyroidism of renal origin: Secondary | ICD-10-CM | POA: Diagnosis not present

## 2018-02-19 DIAGNOSIS — R809 Proteinuria, unspecified: Secondary | ICD-10-CM | POA: Diagnosis not present

## 2018-02-20 ENCOUNTER — Ambulatory Visit
Admission: RE | Admit: 2018-02-20 | Discharge: 2018-02-20 | Disposition: A | Discharge status: Home / Self Care | Payer: Medicare PPO | Source: Ambulatory Visit | Attending: Family Medicine | Admitting: Family Medicine

## 2018-02-20 DIAGNOSIS — Z1231 Encounter for screening mammogram for malignant neoplasm of breast: Secondary | ICD-10-CM | POA: Diagnosis not present

## 2018-03-05 DIAGNOSIS — N183 Chronic kidney disease, stage 3 (moderate): Secondary | ICD-10-CM | POA: Diagnosis not present

## 2018-03-05 DIAGNOSIS — N2581 Secondary hyperparathyroidism of renal origin: Secondary | ICD-10-CM | POA: Diagnosis not present

## 2018-03-05 DIAGNOSIS — I10 Essential (primary) hypertension: Secondary | ICD-10-CM | POA: Diagnosis not present

## 2018-04-07 ENCOUNTER — Telehealth: Payer: Self-pay | Admitting: Family Medicine

## 2018-04-07 NOTE — Telephone Encounter (Signed)
Pt came in today wanting to talk to someone about her bill. She is questioning date of service 06/21/16 and 06/27/2017  Please call her [phone]

## 2018-04-16 NOTE — Telephone Encounter (Signed)
I sent a msg to coding team and I called pt to let her know I will follow up with her as I get more info.

## 2018-05-14 NOTE — Telephone Encounter (Signed)
Pt states that an adjusted bill would be sent [facility]. Pt states she received another bill that is the same as before, no adjustments. Pt requesting a call back.

## 2018-05-21 NOTE — Telephone Encounter (Signed)
Pt came in and we called billing dept to confirm her bill is adjusted.

## 2018-06-20 ENCOUNTER — Telehealth: Payer: Self-pay | Admitting: Family Medicine

## 2018-06-20 DIAGNOSIS — M8589 Other specified disorders of bone density and structure, multiple sites: Secondary | ICD-10-CM

## 2018-06-20 DIAGNOSIS — E782 Mixed hyperlipidemia: Secondary | ICD-10-CM

## 2018-06-20 NOTE — Telephone Encounter (Signed)
-----   Message from Robert Bellow, LPN sent at 16/09/958  3:38 PM EDT ----- Regarding: Labs 10/10 Lab orders needed. Thank you.  Insurance:  [employer]

## 2018-06-23 ENCOUNTER — Ambulatory Visit: Payer: Medicare Other

## 2018-06-24 DIAGNOSIS — N2581 Secondary hyperparathyroidism of renal origin: Secondary | ICD-10-CM | POA: Diagnosis not present

## 2018-06-24 DIAGNOSIS — N183 Chronic kidney disease, stage 3 (moderate): Secondary | ICD-10-CM | POA: Diagnosis not present

## 2018-06-24 DIAGNOSIS — I10 Essential (primary) hypertension: Secondary | ICD-10-CM | POA: Diagnosis not present

## 2018-06-24 LAB — BASIC METABOLIC PANEL
BUN: 25 — AB (ref 4–21)
Creatinine: 1.2 — AB (ref <=1.1)
Glucose: 111
Potassium: 4.7 (ref 3.4–5.3)
Sodium: 138 (ref 137–147)

## 2018-06-24 LAB — HEPATIC FUNCTION PANEL
ALK PHOS: 63 (ref 25–125)
ALT: 11 (ref 7–35)
AST: 15 (ref 13–35)
BILIRUBIN, TOTAL: 0.4

## 2018-06-25 ENCOUNTER — Ambulatory Visit (INDEPENDENT_AMBULATORY_CARE_PROVIDER_SITE_OTHER): Payer: Medicare PPO

## 2018-06-25 VITALS — BP 112/76 | HR 54 | Temp 97.6°F | Ht 59.5 in | Wt 160.0 lb

## 2018-06-25 DIAGNOSIS — E782 Mixed hyperlipidemia: Secondary | ICD-10-CM | POA: Diagnosis not present

## 2018-06-25 DIAGNOSIS — M8589 Other specified disorders of bone density and structure, multiple sites: Secondary | ICD-10-CM | POA: Diagnosis not present

## 2018-06-25 DIAGNOSIS — Z0000 Encounter for general adult medical examination without abnormal findings: Secondary | ICD-10-CM | POA: Diagnosis not present

## 2018-06-25 DIAGNOSIS — Z23 Encounter for immunization: Secondary | ICD-10-CM

## 2018-06-25 LAB — LIPID PANEL
Cholesterol: 149 mg/dL (ref 0–200)
HDL: 75.4 mg/dL (ref >39.00)
LDL CALC: 47 mg/dL (ref 0–99)
NONHDL: 73.9
Total CHOL/HDL Ratio: 2
Triglycerides: 136 mg/dL (ref 0.0–149.0)
VLDL: 27.2 mg/dL (ref 0.0–40.0)

## 2018-06-25 LAB — HEMOGLOBIN A1C: Hgb A1c MFr Bld: 6 % (ref 4.6–6.5)

## 2018-06-25 LAB — VITAMIN D 25 HYDROXY (VIT D DEFICIENCY, FRACTURES): VITD: 63.23 ng/mL (ref 30.00–100.00)

## 2018-06-25 NOTE — Progress Notes (Signed)
Subjective:   Natasha Sawyer is a 77 y.o. female who presents for Medicare Annual (Subsequent) preventive examination.  Review of Systems:  N/A Cardiac Risk Factors include: advanced age (>27men, >71 women);dyslipidemia;hypertension;obesity (BMI >30kg/m2)     Objective:     Vitals: BP 112/76 (BP Location: Right Arm, Patient Position: Sitting, Cuff Size: Normal)   Pulse (!) 54   Temp 97.6 F (36.4 C) (Oral)   Ht 4' 11.5" (1.511 m) Comment: no shoes  Wt 160 lb (72.6 kg)   SpO2 97%   BMI 31.78 kg/m   Body mass index is 31.78 kg/m.  Advanced Directives 06/25/2018 06/16/2017 06/12/2016  Does Patient Have a Medical Advance Directive? Yes Yes Yes  Type of Estate agent of [place];Living will Living will;Healthcare Power of [employer] Power of [place];Living will  Does patient want to make changes to medical advance directive? - - No - Patient declined  Copy of Healthcare Power of Attorney in Chart? No - copy requested No - copy requested No - copy requested    Tobacco Social History   Tobacco Use  Smoking Status Never Smoker  Smokeless Tobacco Never Used     Counseling given: No   Clinical Intake:  Pre-visit preparation completed: Yes  Pain : No/denies pain Pain Score: 0-No pain     Nutritional Status: BMI > 30  Obese Nutritional Risks: None Diabetes: No  How often do you need to have someone help you when you read instructions, pamphlets, or other written materials from your doctor or pharmacy?: 1 - Never What is the last grade level you completed in school?: Bachelor degree  Interpreter Needed?: No  Comments: pt is a widow and lives at [facility] Information entered by :: [employer], LPN  Past Medical History:  Diagnosis Date  . Chronic kidney disease   . Hyperlipidemia   . Hypertension   . Premature beats    Past Surgical History:  Procedure Laterality Date  . APPENDECTOMY    . CESAREAN SECTION    . EYE SURGERY     cataract and eyelid  . HERNIA REPAIR     Family History  Problem Relation Age of Onset  . Diabetes Mother   . Stroke Father   . Breast cancer Cousin 62       maternal   Social History   Socioeconomic History  . Marital status: Widowed    Spouse name: Not on file  . Number of children: Not on file  . Years of education: Not on file  . Highest education level: Not on file  Occupational History  . Not on file  Social Needs  . Financial resource strain: Not on file  . Food insecurity:    Worry: Not on file    Inability: Not on file  . Transportation needs:    Medical: Not on file    Non-medical: Not on file  Tobacco Use  . Smoking status: Never Smoker  . Smokeless tobacco: Never Used  Substance and Sexual Activity  . Alcohol use: No  . Drug use: No  . Sexual activity: Not Currently  Lifestyle  . Physical activity:    Days per week: Not on file    Minutes per session: Not on file  . Stress: Not on file  Relationships  . Social connections:    Talks on phone: Not on file    Gets together: Not on file    Attends religious service: Not on file    Active  member of club or organization: Not on file    Attends meetings of clubs or organizations: Not on file    Relationship status: Not on file  Other Topics Concern  . Not on file  Social History Narrative   Lives at [facility]    HAs living will, full code    Outpatient Encounter Medications as of 06/25/2018  Medication Sig  . Acetaminophen (TYLENOL ARTHRITIS PAIN PO) Take 650 mg by mouth daily.  Marland Kitchen amLODipine (NORVASC) 10 MG tablet Take 10 mg by mouth daily.  . enalapril (VASOTEC) 20 MG tablet Take 20 mg by mouth daily.  . Ergocalciferol (VITAMIN D2) 2000 UNITS TABS Take 1 tablet by mouth daily.  Marland Kitchen ezetimibe-simvastatin (VYTORIN) 10-20 MG tablet TAKE 1 TABLET AT BEDTIME  . zolpidem (AMBIEN) 5 MG tablet Take 2.5 mg by mouth at bedtime as needed.   . [DISCONTINUED] benzonatate (TESSALON) 100 MG capsule TK 1 TO 2 CS PO  Q 8 H PRN COU   No facility-administered encounter medications on file as of 06/25/2018.     Activities of Daily Living In your present state of health, do you have any difficulty performing the following activities: 06/25/2018  Hearing? N  Vision? N  Difficulty concentrating or making decisions? N  Walking or climbing stairs? Y  Comment SOB when walking long distances  Dressing or bathing? N  Doing errands, shopping? N  Preparing Food and eating ? N  Using the Toilet? N  In the past six months, have you accidently leaked urine? N  Do you have problems with loss of bowel control? N  Managing your Medications? N  Managing your Finances? N  Housekeeping or managing your Housekeeping? N  Some recent data might be hidden    Patient Care Team: Excell Seltzer, MD as PCP - General Mady Haagensen, MD as Consulting Physician (Internal Medicine)    Assessment:   This is a routine wellness examination for Natasha Sawyer.   Hearing Screening   125Hz  250Hz  500Hz  1000Hz  2000Hz  3000Hz  4000Hz  6000Hz  8000Hz   Right ear:   0 0 40  40    Left ear:   0 0 40  0      Visual Acuity Screening   Right eye Left eye Both eyes  Without correction: 20/30 20/20 20/20   With correction:        Exercise Activities and Dietary recommendations Current Exercise Habits: Home exercise routine, Type of exercise: walking, Time (Minutes): 40, Frequency (Times/Week): 5, Weekly Exercise (Minutes/Week): 200, Intensity: Mild, Exercise limited by: None identified  Goals    . physical     Starting 06/25/2018, I will continue to walk 1-2 miles 5 days per week.        Fall Risk Fall Risk  06/25/2018 06/16/2017 06/12/2016  Falls in the past year? No Yes No  Comment - pt lost balance and fell out of bathroom door; injury to head. no LOC or medical treatment -  Number falls in past yr: - 1 -  Injury with Fall? - Yes -   Depression Screen PHQ 2/9 Scores 06/25/2018 06/16/2017 06/12/2016  PHQ - 2 Score 0 0 0  PHQ- 9  Score 0 0 -     Cognitive Function MMSE - Mini Mental State Exam 06/25/2018 06/16/2017 06/12/2016  Orientation to time 5 5 5   Orientation to Place 5 5 5   Registration 3 3 3   Attention/ Calculation 0 0 0  Recall 3 3 3   Language- name 2 objects 0 0 0  Language- repeat 1 1 1   Language- follow 3 step command 3 3 3   Language- read & follow direction 0 0 0  Write a sentence 0 0 0  Copy design 0 0 0  Total score 20 20 20      PLEASE NOTE: A Mini-Cog screen was completed. Maximum score is 20. A value of 0 denotes this part of Folstein MMSE was not completed or the patient failed this part of the Mini-Cog screening.   Mini-Cog Screening Orientation to Time - Max 5 pts Orientation to Place - Max 5 pts Registration - Max 3 pts Recall - Max 3 pts Language Repeat - Max 1 pts Language Follow 3 Step Command - Max 3 pts     Immunization History  Administered Date(s) Administered  . Influenza,inj,Quad PF,6+ Mos 06/25/2018  . Influenza-Unspecified 06/16/2013  . Pneumococcal Conjugate-13 06/25/2018  . Zoster 02/13/2012    Screening Tests Health Maintenance  Topic Date Due  . MAMMOGRAM  02/21/2019  . PNA vac Low Risk Adult (2 of 2 - PPSV23) 06/26/2019  . Fecal DNA (Cologuard)  07/18/2019  . TETANUS/TDAP  02/11/2021  . INFLUENZA VACCINE  Completed  . DEXA SCAN  Completed      Plan:   I have personally reviewed, addressed, and noted the following in the patient's chart:  A. Medical and social history B. Use of alcohol, tobacco or illicit drugs  C. Current medications and supplements D. Functional ability and status E.  Nutritional status F.  Physical activity G. Advance directives H. List of other physicians I.  Hospitalizations, surgeries, and ER visits in previous 12 months J.  Vitals K. Screenings to include hearing, vision, cognitive, depression L. Referrals and appointments - none  In addition, I have reviewed and discussed with patient certain preventive protocols,  quality metrics, and best practice recommendations. A written personalized care plan for preventive services as well as general preventive health recommendations were provided to patient.  See attached scanned questionnaire for additional information.   Signed,   Randa Evens, MHA, BS, LPN Health Coach

## 2018-06-25 NOTE — Progress Notes (Signed)
I reviewed health advisor's note, was available for consultation, and agree with documentation and plan.   Signed,  Madora Barletta T. Dolores Ewing, MD  

## 2018-06-25 NOTE — Progress Notes (Signed)
PCP notes:   Health maintenance:  Flu vaccine - administered PCV13 - administered  Abnormal screenings:   Hearing - failed  Hearing Screening   125Hz  250Hz  500Hz  1000Hz  2000Hz  3000Hz  4000Hz  6000Hz  8000Hz   Right ear:   0 0 40  40    Left ear:   0 0 40  0     Patient concerns:   None  Nurse concerns:  None  Next PCP appt:   06/30/18 @ 1400

## 2018-06-25 NOTE — Patient Instructions (Addendum)
Natasha Sawyer , Thank you for taking time to come for your Medicare Wellness Visit. I appreciate your ongoing commitment to your health goals. Please review the following plan we discussed and let me know if I can assist you in the future.   These are the goals we discussed: Goals    . physical     Starting 06/25/2018, I will continue to walk 1-2 miles 5 days per week.        This is a list of the screening recommended for you and due dates:  Health Maintenance  Topic Date Due  . Mammogram  02/21/2019  . Pneumonia vaccines (2 of 2 - PPSV23) 06/26/2019  . Cologuard (Stool DNA test)  07/18/2019  . Tetanus Vaccine  02/11/2021  . Flu Shot  Completed  . DEXA scan (bone density measurement)  Completed   Preventive Care for Adults  A healthy lifestyle and preventive care can promote health and wellness. Preventive health guidelines for adults include the following key practices.  . A routine yearly physical is a good way to check with your health care provider about your health and preventive screening. It is a chance to share any concerns and updates on your health and to receive a thorough exam.  . Visit your dentist for a routine exam and preventive care every 6 months. Brush your teeth twice a day and floss once a day. Good oral hygiene prevents tooth decay and gum disease.  . The frequency of eye exams is based on your age, health, family medical history, use  of contact lenses, and other factors. Follow your health care provider's recommendations for frequency of eye exams.  . Eat a healthy diet. Foods like vegetables, fruits, whole grains, low-fat dairy products, and lean protein foods contain the nutrients you need without too many calories. Decrease your intake of foods high in solid fats, added sugars, and salt. Eat the right amount of calories for you. Get information about a proper diet from your health care provider, if necessary.  . Regular physical exercise is one of the most  important things you can do for your health. Most adults should get at least 150 minutes of moderate-intensity exercise (any activity that increases your heart rate and causes you to sweat) each week. In addition, most adults need muscle-strengthening exercises on 2 or more days a week.  Silver Sneakers may be a benefit available to you. To determine eligibility, you may visit the website: www.silversneakers.com or contact program at (870) 515-5015 Mon-Fri between 8AM-8PM.   . Maintain a healthy weight. The body mass index (BMI) is a screening tool to identify possible weight problems. It provides an estimate of body fat based on height and weight. Your health care provider can find your BMI and can help you achieve or maintain a healthy weight.   For adults 20 years and older: ? A BMI below 18.5 is considered underweight. ? A BMI of 18.5 to 24.9 is normal. ? A BMI of 25 to 29.9 is considered overweight. ? A BMI of 30 and above is considered obese.   . Maintain normal blood lipids and cholesterol levels by exercising and minimizing your intake of saturated fat. Eat a balanced diet with plenty of fruit and vegetables. Blood tests for lipids and cholesterol should begin at age 39 and be repeated every 5 years. If your lipid or cholesterol levels are high, you are over 50, or you are at high risk for heart disease, you may need your cholesterol  levels checked more frequently. Ongoing high lipid and cholesterol levels should be treated with medicines if diet and exercise are not working.  . If you smoke, find out from your health care provider how to quit. If you do not use tobacco, please do not start.  . If you choose to drink alcohol, please do not consume more than 2 drinks per day. One drink is considered to be 12 ounces (355 mL) of beer, 5 ounces (148 mL) of wine, or 1.5 ounces (44 mL) of liquor.  . If you are 42-75 years old, ask your health care provider if you should take aspirin to prevent  strokes.  . Use sunscreen. Apply sunscreen liberally and repeatedly throughout the day. You should seek shade when your shadow is shorter than you. Protect yourself by wearing long sleeves, pants, a wide-brimmed hat, and sunglasses year round, whenever you are outdoors.  . Once a month, do a whole body skin exam, using a mirror to look at the skin on your back. Tell your health care provider of new moles, moles that have irregular borders, moles that are larger than a pencil eraser, or moles that have changed in shape or color.

## 2018-06-29 DIAGNOSIS — N2581 Secondary hyperparathyroidism of renal origin: Secondary | ICD-10-CM | POA: Diagnosis not present

## 2018-06-29 DIAGNOSIS — I10 Essential (primary) hypertension: Secondary | ICD-10-CM | POA: Diagnosis not present

## 2018-06-29 DIAGNOSIS — N183 Chronic kidney disease, stage 3 (moderate): Secondary | ICD-10-CM | POA: Diagnosis not present

## 2018-06-29 DIAGNOSIS — R809 Proteinuria, unspecified: Secondary | ICD-10-CM | POA: Diagnosis not present

## 2018-06-30 ENCOUNTER — Encounter: Payer: Self-pay | Admitting: Family Medicine

## 2018-06-30 ENCOUNTER — Ambulatory Visit (INDEPENDENT_AMBULATORY_CARE_PROVIDER_SITE_OTHER): Payer: Medicare PPO | Admitting: Family Medicine

## 2018-06-30 VITALS — BP 120/80 | HR 52 | Temp 97.6°F | Ht 59.5 in | Wt 161.5 lb

## 2018-06-30 DIAGNOSIS — I10 Essential (primary) hypertension: Secondary | ICD-10-CM

## 2018-06-30 DIAGNOSIS — N183 Chronic kidney disease, stage 3 unspecified: Secondary | ICD-10-CM

## 2018-06-30 DIAGNOSIS — R001 Bradycardia, unspecified: Secondary | ICD-10-CM | POA: Diagnosis not present

## 2018-06-30 DIAGNOSIS — Z0000 Encounter for general adult medical examination without abnormal findings: Secondary | ICD-10-CM | POA: Diagnosis not present

## 2018-06-30 DIAGNOSIS — R002 Palpitations: Secondary | ICD-10-CM | POA: Diagnosis not present

## 2018-06-30 NOTE — Assessment & Plan Note (Signed)
Stable .Marland Kitchen Pt asymptomatic except possible fatigue associated.

## 2018-06-30 NOTE — Assessment & Plan Note (Signed)
Well controlled. Continue current medication.  

## 2018-06-30 NOTE — Progress Notes (Signed)
Subjective:    Patient ID: Natasha Sawyer, female    DOB: 1941-06-16, 77 y.o.   MRN: [id number]  HPI  The patient presents for  complete physical and review of chronic health problems.   The patient saw Lu Duffel, LPN for medicare wellness. Note reviewed in detail and important notes copied below.  Abnormal screenings:   Hearing - failed             Hearing Screening   125Hz  250Hz  500Hz  1000Hz  2000Hz  3000Hz  4000Hz  6000Hz  8000Hz   Right ear:   0 0 40  40    Left ear:            06/30/18    She has been feeling well overall. No CP, no SOB.  She did have 2 spells of  palpitations.. Lasted 10 min. Improved with rest. Not sure if regular.  Occurred after walking 2 miles or walking around in [place].. Last episode after beach 2 weeks ago.  She feel it happened when she was wearing herself out.   Associated with SOB, fatigue.Marland Kitchen No sweating.  Has had similar in past over past few years. Has worn a Holter monitor in past 2013.Marland Kitchen Reviewed holter.  Seen by Dr. Kirke Corin in past.  Caffeine.. 1 soda a day.  2014 treadmill stress test which was normal. Echocardiogram showed no significant structural abnormalities  Hypertension:   Good control on enalapril and amlodipine  Using medication without problems or lightheadedness:  Chest pain with exertion:none Edema: if sitting a lot Short of breath: Average home BPs: Other issues: taken off Bblocker given bradycardia.. Does not seem to have improved.  Elevated Cholesterol:  Good control on vytorin Lab Results  Component Value Date   CHOL 149 06/25/2018   HDL 75.40 06/25/2018   LDLCALC 47 06/25/2018   TRIG 136.0 06/25/2018   CHOLHDL 2 06/25/2018  Using medications without problems: Muscle aches:  Diet compliance: healthy Exercise: walking Other complaints:  CKD;  Stable Crfollowed by nephrology, Dr. Cherylann Ratel 02/2018 stable Cr 1.22, GFR 43.  Social History /Family History/Past Medical History reviewed in detail and updated in  EMR if needed. Blood pressure 120/80, pulse (!) 52, temperature 97.6 F (36.4 C), temperature source Oral, height 4' 11.5" (1.511 m), weight 161 lb 8 oz (73.3 kg), SpO2 98 %.  Review of Systems  Constitutional: Negative for fatigue and fever.  HENT: Negative for congestion.   Eyes: Negative for pain.  Respiratory: Negative for cough and shortness of breath.   Cardiovascular: Positive for palpitations. Negative for chest pain and leg swelling.  Gastrointestinal: Negative for abdominal pain.  Genitourinary: Negative for dysuria and vaginal bleeding.  Musculoskeletal: Negative for back pain.  Neurological: Negative for syncope, light-headedness and headaches.  Psychiatric/Behavioral: Negative for dysphoric mood.       Objective:   Physical Exam  Constitutional: Vital signs are normal. She appears well-developed and well-nourished. She is cooperative.  Non-toxic appearance. She does not appear ill. No distress.  HENT:  Head: Normocephalic.  Right Ear: Hearing, tympanic membrane, external ear and ear canal normal.  Left Ear: Hearing, tympanic membrane, external ear and ear canal normal.  Nose: Nose normal.  Eyes: Pupils are equal, round, and reactive to light. Conjunctivae, EOM and lids are normal. Lids are everted and swept, no foreign bodies found.  Neck: Trachea normal and normal range of motion. Neck supple. Carotid bruit is not present. No thyroid mass and no thyromegaly present.  Cardiovascular: Normal rate, regular rhythm, S1 normal, S2 normal, normal  heart sounds and intact distal pulses. Exam reveals no gallop.  No murmur heard. Pulmonary/Chest: Effort normal and breath sounds normal. No respiratory distress. She has no wheezes. She has no rhonchi. She has no rales.  Abdominal: Soft. Normal appearance and bowel sounds are normal. She exhibits no distension, no fluid wave, no abdominal bruit and no mass. There is no hepatosplenomegaly. There is no tenderness. There is no rebound, no  guarding and no CVA tenderness. No hernia.  Lymphadenopathy:    She has no cervical adenopathy.    She has no axillary adenopathy.  Neurological: She is alert. She has normal strength. No cranial nerve deficit or sensory deficit.  Skin: Skin is warm, dry and intact. No rash noted.  Psychiatric: Her speech is normal and behavior is normal. Judgment normal. Her mood appears not anxious. Cognition and memory are normal. She does not exhibit a depressed mood.          Assessment & Plan:  The patient's preventative maintenance and recommended screening tests for an annual wellness exam were reviewed in full today. Brought up to date unless services declined.  Counselled on the importance of diet, exercise, and its role in overall health and mortality. The patient's FH and SH was reviewed, including their home life, tobacco status, and drug and alcohol status.   Mammo: 02/2018 PAP/DVE: last pap 2012 nml, okay with stopping these., asymptomatic, no family history of ovarianor uterine cancer: WILL STOP. Colon: last done many years ago 2006 : nml,  On cologuard. Plan repeat 2020 Nonsmoker Flu vaccine - given flu and PNA vaccine Bone density - 02/2017 ostopenia

## 2018-06-30 NOTE — Patient Instructions (Addendum)
Follow up  With Dr. Kirke Corin for palpitations and bradycardia.  Work on low carb diet and regular exercise as tolerated.

## 2018-06-30 NOTE — Assessment & Plan Note (Signed)
Holter, EKG, stress test and ECHO nml in 2013, 2014 per Dr. Kirke Corin for similar complaints  Given 2 episodes in last 2 months, fairly symptomatic.. I recommended cardiac re-eval... She was not currently interested.  If palpitations continue to be frequent.. She will consider following up with Cards.

## 2018-06-30 NOTE — Assessment & Plan Note (Signed)
Followed by Dr. Cherylann Ratel,  Appears to be a slow decline over time. Encouraged sugar and BP control.

## 2018-07-17 ENCOUNTER — Other Ambulatory Visit: Payer: Self-pay | Admitting: Family Medicine

## 2018-08-07 ENCOUNTER — Ambulatory Visit (INDEPENDENT_AMBULATORY_CARE_PROVIDER_SITE_OTHER): Payer: Medicare PPO | Admitting: Cardiovascular Disease

## 2018-08-07 ENCOUNTER — Encounter: Payer: Self-pay | Admitting: Cardiovascular Disease

## 2018-08-07 ENCOUNTER — Ambulatory Visit (INDEPENDENT_AMBULATORY_CARE_PROVIDER_SITE_OTHER): Payer: Medicare PPO

## 2018-08-07 VITALS — BP 124/80 | HR 58 | Ht 60.0 in | Wt 161.5 lb

## 2018-08-07 DIAGNOSIS — E782 Mixed hyperlipidemia: Secondary | ICD-10-CM

## 2018-08-07 DIAGNOSIS — I10 Essential (primary) hypertension: Secondary | ICD-10-CM

## 2018-08-07 DIAGNOSIS — R0602 Shortness of breath: Secondary | ICD-10-CM

## 2018-08-07 DIAGNOSIS — R002 Palpitations: Secondary | ICD-10-CM

## 2018-08-07 NOTE — Patient Instructions (Signed)
Medication Instructions:  No changes If you need a refill on your cardiac medications before your next appointment, please call your pharmacy.   Lab work: None ordered  Testing/Procedures: Your physician has requested that you have an exercise tolerance test. For further information please visit [email or web address]. Please also follow instruction sheet, as given. This will take place at the [place] office.  Do not drink or eat foods with caffeine for 24 hours before the test. (Chocolate, coffee, tea, or energy drinks)  If you use an inhaler, bring it with you to the test.  Do not smoke for 4 hours before the test.  Wear comfortable shoes and clothing.   Your physician has recommended that you wear an 14 day Zio event monitor. Event monitors are medical devices that record the heart's electrical activity. Doctors most often us these monitors to diagnose arrhythmias. Arrhythmias are problems with the speed or rhythm of the heartbeat. The monitor is a small, portable device. You can wear one while you do your normal daily activities. This is usually used to diagnose what is causing palpitations/syncope (passing out).   Follow-Up: Follow up as needed with Dr. Kirke CorinArida

## 2018-08-07 NOTE — Progress Notes (Signed)
Cardiology Office Note   Date:  08/07/2018   ID:  Natasha Sawyer, DOB Jan 25, 1941, MRN [id number]  PCP:  Excell Seltzer, MD  Cardiologist:   Lorine Bears, MD   Chief Complaint  Patient presents with  . other    Self ref for irreg. heart beats and shortness of breath. Pt. was seen back in 2014 with Dr. Kirke Corin. Meds reviewed by the pt. verbally.       History of Present Illness: Natasha Sawyer is a 77 y.o. female who was referred by Dr. Ermalene Searing for evaluation of palpitations and shortness of breath.  She has multiple chronic medical conditions including essential hypertension, hyperlipidemia, premature beats and mild chronic kidney disease. She was seen by me in 2013/2014 for palpitations, atypical chest pain and shortness of breath.  She underwent a treadmill stress test which was normal.  Echocardiogram showed normal ejection fraction with no significant valvular abnormalities.  48-hour Holter monitor showed normal sinus rhythm with frequent PACs and no other significant arrhythmia.  She now returns with intermittent episodes of palpitations and tachycardia.  She reports that normally she has one episode a year of those.  However, she had an episode in August and then again 2 days ago.  She felt fast heartbeats that were sudden associated with mild dizziness but no syncope or presyncope.  She felt nauseous and she felt the need to use the bathroom.  She had mild shortness of breath and she has noticed worsening exertional dyspnea and fatigue recently.  No chest pain.   Past Medical History:  Diagnosis Date  . Chronic kidney disease   . Hyperlipidemia   . Hypertension   . Premature beats     Past Surgical History:  Procedure Laterality Date  . APPENDECTOMY    . CESAREAN SECTION    . EYE SURGERY     cataract and eyelid  . HERNIA REPAIR       Current Outpatient Medications  Medication Sig Dispense Refill  . Acetaminophen (TYLENOL ARTHRITIS PAIN PO) Take 650 mg by mouth  daily.    Marland Kitchen amLODipine (NORVASC) 10 MG tablet Take 10 mg by mouth daily.    . enalapril (VASOTEC) 20 MG tablet Take 20 mg by mouth daily.    . Ergocalciferol (VITAMIN D2) 2000 UNITS TABS Take 1 tablet by mouth daily.    Marland Kitchen ezetimibe-simvastatin (VYTORIN) 10-20 MG tablet TAKE 1 TABLET AT BEDTIME 90 tablet 3  . zolpidem (AMBIEN) 5 MG tablet Take 2.5 mg by mouth at bedtime as needed.      No current facility-administered medications for this visit.     Allergies:   Patient has no known allergies.    Social History:  The patient  reports that she has never smoked. She has never used smokeless tobacco. She reports that she does not drink alcohol or use drugs.   Family History:  The patient's family history includes Breast cancer (age of onset: 31) in her cousin; Diabetes in her mother; Stroke in her father.    ROS:  Please see the history of present illness.   Otherwise, review of systems are positive for none.   All other systems are reviewed and negative.    PHYSICAL EXAM: VS:  BP 124/80 (BP Location: Right Arm, Patient Position: Sitting, Cuff Size: Normal)   Pulse (!) 58   Ht 5' (1.524 m)   Wt 161 lb 8 oz (73.3 kg)   SpO2 98%   BMI 31.54 kg/m  ,  BMI Body mass index is 31.54 kg/m. GEN: Well nourished, well developed, in no acute distress  HEENT: normal  Neck: no JVD, carotid bruits, or masses Cardiac: RRR; no murmurs, rubs, or gallops,no edema  Respiratory:  clear to auscultation bilaterally, normal work of breathing GI: soft, nontender, nondistended, + BS MS: no deformity or atrophy  Skin: warm and dry, no rash Neuro:  Strength and sensation are intact Psych: euthymic mood, full affect   EKG:  EKG is ordered today. The ekg ordered today demonstrates sinus bradycardia with no significant ST or T wave changes.   Recent Labs: 06/24/2018: ALT 11; BUN 25; Creatinine 1.2; Potassium 4.7; Sodium 138    Lipid Panel    Component Value Date/Time   CHOL 149 06/25/2018 1042    TRIG 136.0 06/25/2018 1042   TRIG 68 12/12/2009 1413   HDL 75.40 06/25/2018 1042   CHOLHDL 2 06/25/2018 1042   VLDL 27.2 06/25/2018 1042   LDLCALC 47 06/25/2018 1042      Wt Readings from Last 3 Encounters:  08/07/18 161 lb 8 oz (73.3 kg)  06/30/18 161 lb 8 oz (73.3 kg)  06/25/18 160 lb (72.6 kg)        PAD Screen 08/07/2018  Previous PAD dx? No  Previous surgical procedure? No  Pain with walking? No  Feet/toe relief with dangling? No  Painful, non-healing ulcers? No  Extremities discolored? No      ASSESSMENT AND PLAN:  1.  Palpitations: Worrisome for SVT or atrial fibrillation but her symptoms are not very frequent.  I requested a 2-week ZIO patch monitor.  2.  Exertional dyspnea without chest pain: She does have risk factors for coronary artery disease.  I requested a treadmill stress test.  She is able to exercise on a treadmill and her baseline EKG is normal.  3.  Essential hypertension: Blood pressure is controlled.  She is to be on metoprolol but that was discontinued due to bradycardia.  4.  Hyperlipidemia: Currently on Vytorin with most recent LDL of 47.    Disposition:   FU with me as needed.   Signed,  Lorine BearsMuhammad Waylynn Benefiel, MD  08/07/2018 4:16 PM    [facility]

## 2018-08-21 DIAGNOSIS — R0602 Shortness of breath: Secondary | ICD-10-CM | POA: Diagnosis not present

## 2018-08-26 DIAGNOSIS — R002 Palpitations: Secondary | ICD-10-CM | POA: Diagnosis not present

## 2018-08-27 ENCOUNTER — Ambulatory Visit (INDEPENDENT_AMBULATORY_CARE_PROVIDER_SITE_OTHER): Payer: Medicare PPO

## 2018-08-27 DIAGNOSIS — R0602 Shortness of breath: Secondary | ICD-10-CM | POA: Diagnosis not present

## 2018-09-01 LAB — EXERCISE TOLERANCE TEST
CHL RATE OF PERCEIVED EXERTION: 14
CSEPEW: 5.3 METS
Exercise duration (min): 3 min
Exercise duration (sec): 39 s
MPHR: 145 {beats}/min
Peak HR: 130 {beats}/min
Percent HR: 89 %
Rest HR: 62 {beats}/min

## 2018-09-03 ENCOUNTER — Telehealth: Payer: Self-pay | Admitting: Cardiovascular Disease

## 2018-09-03 ENCOUNTER — Telehealth: Payer: Self-pay | Admitting: *Deleted

## 2018-09-03 DIAGNOSIS — I471 Supraventricular tachycardia: Secondary | ICD-10-CM

## 2018-09-03 NOTE — Telephone Encounter (Signed)
Patient calling  Would like to check the status of heart monitor and ETT results  Please call to discuss

## 2018-09-03 NOTE — Telephone Encounter (Signed)
Patient made aware of results and verbalized understanding.  Echo order has been placed. Message sent to scheduling.

## 2018-09-03 NOTE — Telephone Encounter (Signed)
Patient calling to discuss recent testing results  ° °Please call  ° °

## 2018-09-03 NOTE — Telephone Encounter (Addendum)
Patient made aware of results and verbalized understanding.  Notes recorded by Iran OuchArida, Muhammad A, MD on 09/02/2018 at 4:15 PM EST Inform patient that stress test was normal.

## 2018-09-03 NOTE — Telephone Encounter (Signed)
-----   Message from Iran OuchMuhammad A Arida, MD sent at 09/03/2018  1:40 PM EST ----- Inform patient that monitor showed short runs of supraventricular tachycardia.  These overall are not serious but might require treatment based on her symptoms.  We also need to set her up for an echocardiogram to make sure there is no other structural heart abnormalities.  Have her follow-up with me after echo.

## 2018-09-03 NOTE — Telephone Encounter (Signed)
Left a message for the patient to call back.  

## 2018-09-03 NOTE — Telephone Encounter (Signed)
Patient will be unavailable after 12:15p

## 2018-09-22 ENCOUNTER — Telehealth: Payer: Self-pay | Admitting: Family Medicine

## 2018-09-22 NOTE — Telephone Encounter (Signed)
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

## 2018-09-22 NOTE — Telephone Encounter (Signed)
Natasha Sawyer notified that her immunization record is ready to be picked up at the front desk.

## 2018-09-25 ENCOUNTER — Ambulatory Visit: Payer: Medicare PPO | Admitting: Cardiovascular Disease

## 2018-09-25 ENCOUNTER — Encounter

## 2018-09-29 ENCOUNTER — Telehealth: Payer: Self-pay

## 2018-09-29 NOTE — Telephone Encounter (Signed)
l mom for pt to call and schedule follow up after echo

## 2018-09-29 NOTE — Telephone Encounter (Signed)
-----   Message from [name], RN sent at 09/29/2018  2:52 PM EST ----- Regarding: Natasha Sawyer appt Patient needs a follow up appointment with Dr. Kirke Corin after the echo. Thank you, Misty Stanley

## 2018-10-13 ENCOUNTER — Ambulatory Visit (INDEPENDENT_AMBULATORY_CARE_PROVIDER_SITE_OTHER): Payer: Medicare PPO

## 2018-10-13 DIAGNOSIS — I471 Supraventricular tachycardia: Secondary | ICD-10-CM

## 2018-10-14 ENCOUNTER — Ambulatory Visit: Payer: Medicare PPO | Admitting: Nurse Practitioner

## 2018-10-20 ENCOUNTER — Telehealth: Payer: Self-pay | Admitting: *Deleted

## 2018-10-20 NOTE — Telephone Encounter (Signed)
-----   Message from Iran Ouch, MD sent at 10/19/2018  1:57 PM EST ----- Inform patient that echo was fine.  Normal EF with only mild mitral regurgitation.  No significant abnormalities.

## 2018-10-20 NOTE — Telephone Encounter (Signed)
Left a message for the patient to call back.  

## 2018-10-21 NOTE — Telephone Encounter (Signed)
Results called to pt. Pt verbalized understanding. She is aware of appointment date and time tomorrow.

## 2018-10-21 NOTE — Telephone Encounter (Signed)
Patient returning call for results  Patient will be in tomorrow 2/6 for an appointment with R Dunn

## 2018-10-22 ENCOUNTER — Ambulatory Visit: Payer: Medicare PPO | Admitting: Physician Assistant

## 2018-10-26 DIAGNOSIS — I10 Essential (primary) hypertension: Secondary | ICD-10-CM | POA: Diagnosis not present

## 2018-10-26 DIAGNOSIS — N2581 Secondary hyperparathyroidism of renal origin: Secondary | ICD-10-CM | POA: Diagnosis not present

## 2018-10-26 DIAGNOSIS — N183 Chronic kidney disease, stage 3 (moderate): Secondary | ICD-10-CM | POA: Diagnosis not present

## 2018-10-29 DIAGNOSIS — N183 Chronic kidney disease, stage 3 (moderate): Secondary | ICD-10-CM | POA: Diagnosis not present

## 2018-10-29 DIAGNOSIS — I10 Essential (primary) hypertension: Secondary | ICD-10-CM | POA: Diagnosis not present

## 2018-10-29 DIAGNOSIS — N2581 Secondary hyperparathyroidism of renal origin: Secondary | ICD-10-CM | POA: Diagnosis not present

## 2018-10-29 DIAGNOSIS — R809 Proteinuria, unspecified: Secondary | ICD-10-CM | POA: Diagnosis not present

## 2018-11-12 NOTE — Progress Notes (Signed)
Cardiology Office Note Date:  11/13/2018  Patient ID:  Natasha Sawyer, Natasha Sawyer 14-Jul-1941, MRN [id number] PCP:  Excell Seltzer, MD  Cardiologist:  Dr. Kirke Corin, MD    Chief Complaint: Follow up  History of Present Illness: Natasha Sawyer is a 78 y.o. female with history of hypertension, hyperlipidemia, PACs, and renal insufficiency who presents for follow-up of palpitations and shortness of breath.  Patient was previously seen by Dr. Kirke Corin in 2013/2014 for evaluation of palpitations, atypical chest pain and shortness of breath.  At that time, she underwent treadmill stress test which was normal.  Echo showed a normal EF with no significant valvular abnormalities.  48-hour Holter monitor showed normal sinus rhythm with frequent PACs and no other significant arrhythmia.  She returned to cardiology for follow-up on 08/07/2018 for evaluation of intermittent palpitations and shortness of breath.  She reported that normally she had 1 episode a year of intermittent palpitations.  However, she reported an episode in 04/2018 then again in 07/2018.  Palpitations were associated with mild dizziness without presyncope or syncope.  There was some associated nausea and the urge to use the restroom.  Worsening exertional dyspnea and fatigue.  No chest pain.  Zio monitoring 07/2018: NSR with an average heart rate of 62 bpm, 4 beat run of WCT at a rate of 105 bpm, 18 episodes of SVT were noted with the longest interval lasting 16 beats at a rate of 109 bpm and the fastest rate of 162 bpm lasting 6 beats, occasional PACs with rare PVCs.  There were no patient triggered events.   ETT 08/2018: normal without evidence of ischemia, below average exercise capacity with an exercise duration of 3 minutes and 39 seconds with a maximal heart rate of 130 bpm which was 89% of maximal predicted heart rate. Mild PACs noted in recovery.   Echo 09/2018: EF 60-65%, normal wall motion, Gr1DD, mild MR, LA normal in size, RVSF normal, PASP  normal.  Labs: 06/2018 - LDL 47, A1c 6.0, SCr 1.2, K+ 4.7, LFT normal  She comes in doing well from a cardiac perspective.  Since 05/2018, she has not had any sustained tachypalpitations.  She reports a several year history of once per year tachypalpitations that typically last 20 to 30 minutes followed by spontaneous resolution.  However she had 2 episodes during the months of 8 and 05/2018 prompting her above evaluation.  Since then, she has not had any further episodes.  She did report some heart fluttering sensations while wearing her outpatient cardiac monitoring though did not think much of these and did not trigger the device.  At baseline, her heart rate typically runs in the mid 50s to low 60s bpm.  She reports having previously been on metoprolol, though this had to be discontinued secondary to bradycardia with heart rates in the 40s bpm.  She denies any chest pain, shortness of breath, lower extremity swelling, dizziness, presyncope, syncope, abdominal distention, orthopnea, PND, or early satiety.  No falls since she was last seen.   Past Medical History:  Diagnosis Date  . Chronic kidney disease   . Hyperlipidemia   . Hypertension   . Premature beats     Past Surgical History:  Procedure Laterality Date  . APPENDECTOMY    . CESAREAN SECTION    . EYE SURGERY     cataract and eyelid  . HERNIA REPAIR      Current Meds  Medication Sig  . Acetaminophen (TYLENOL ARTHRITIS PAIN PO) Take 650  mg by mouth daily.  Marland Kitchen amLODipine (NORVASC) 10 MG tablet Take 10 mg by mouth daily.  . enalapril (VASOTEC) 20 MG tablet Take 20 mg by mouth daily.  . Ergocalciferol (VITAMIN D2) 2000 UNITS TABS Take 1 tablet by mouth daily.  Marland Kitchen ezetimibe-simvastatin (VYTORIN) 10-20 MG tablet TAKE 1 TABLET AT BEDTIME  . zolpidem (AMBIEN) 5 MG tablet Take 2.5 mg by mouth at bedtime as needed.     Allergies:   Patient has no known allergies.   Social History:  The patient  reports that she has never smoked. She  has never used smokeless tobacco. She reports that she does not drink alcohol or use drugs.   Family History:  The patient's family history includes Breast cancer (age of onset: 70) in her cousin; Diabetes in her mother; Stroke in her father.  ROS:   Review of Systems  Constitutional: Positive for malaise/fatigue. Negative for chills, diaphoresis, fever and weight loss.  HENT: Negative for congestion.   Eyes: Negative for discharge and redness.  Respiratory: Negative for cough, hemoptysis, sputum production, shortness of breath and wheezing.   Cardiovascular: Positive for palpitations. Negative for chest pain, orthopnea, claudication, leg swelling and PND.  Gastrointestinal: Negative for abdominal pain, blood in stool, heartburn, melena, nausea and vomiting.  Genitourinary: Negative for hematuria.  Musculoskeletal: Negative for falls and myalgias.  Skin: Negative for rash.  Neurological: Negative for dizziness, tingling, tremors, sensory change, speech change, focal weakness, loss of consciousness and weakness.  Endo/Heme/Allergies: Does not bruise/bleed easily.  Psychiatric/Behavioral: Negative for substance abuse. The patient is not nervous/anxious.   All other systems reviewed and are negative.    PHYSICAL EXAM:  VS:  BP 106/62 (BP Location: Left Arm, Patient Position: Sitting, Cuff Size: Normal)   Pulse 64   Ht 5' (1.524 m)   Wt 160 lb 8 oz (72.8 kg)   BMI 31.35 kg/m  BMI: Body mass index is 31.35 kg/m.  Physical Exam  Constitutional: She is oriented to person, place, and time. She appears well-developed and well-nourished.  HENT:  Head: Normocephalic and atraumatic.  Eyes: Right eye exhibits no discharge. Left eye exhibits no discharge.  Neck: Normal range of motion. No JVD present.  Cardiovascular: Normal rate, regular rhythm, S1 normal, S2 normal and normal heart sounds. Exam reveals no distant heart sounds, no friction rub, no midsystolic click and no opening snap.  No  murmur heard. Pulses:      Posterior tibial pulses are 2+ on the right side and 2+ on the left side.  Pulmonary/Chest: Effort normal and breath sounds normal. No respiratory distress. She has no decreased breath sounds. She has no wheezes. She has no rales. She exhibits no tenderness.  Abdominal: Soft. She exhibits no distension. There is no abdominal tenderness.  Musculoskeletal:        General: No edema.  Neurological: She is alert and oriented to person, place, and time.  Skin: Skin is warm and dry. No cyanosis. Nails show no clubbing.  Psychiatric: She has a normal mood and affect. Her speech is normal and behavior is normal. Judgment and thought content normal.     EKG:  Was not ordered today.  Recent Labs: 06/24/2018: ALT 11; BUN 25; Creatinine 1.2; Potassium 4.7; Sodium 138  06/25/2018: Cholesterol 149; HDL 75.40; LDL Cholesterol 47; Total CHOL/HDL Ratio 2; Triglycerides 136.0; VLDL 27.2   CrCl cannot be calculated (Patient's most recent lab result is older than the maximum 21 days allowed.).   Wt Readings from Last  3 Encounters:  11/13/18 160 lb 8 oz (72.8 kg)  08/07/18 161 lb 8 oz (73.3 kg)  06/30/18 161 lb 8 oz (73.3 kg)     Other studies reviewed: Additional studies/records reviewed today include: summarized above  ASSESSMENT AND PLAN:  1. Palpitations/WCT/SVT: She reports a several year history of once yearly tachypalpitations that typically last 20 to 30 minutes followed by spontaneous resolution.  In 8 and 05/2018 she had 2 episodes prompting her above evaluation.  Heart historically has been bradycardic in the 50s bpm and has previously been on metoprolol, though this had to be discontinued in the setting of bradycardia.  Given the infrequent nature of her reported sustained ectopy I do not think repeating cardiac monitoring at this time would be of much benefit as she has not had any sustained episodes since 05/2018.  It certainly seems like her symptoms may be related to  episodes of SVT.  We are unable to add scheduled a beta-blocker or calcium channel blocker given her heart rates.  I have advised her to pick up an AliveCor to monitor heart rate/rhythm at home and bring this in for review if she has any sustained tachypalpitations.  We will give her a trial of short acting propranolol 10 mg as needed sustained tachypalpitations.  Should she have significant increase in ectopy burden, would recommend repeating cardiac monitoring and if she is found to have frequent episodes of sustained SVT, could consider EP referral for ablation.  2. HTN: Blood pressure is well controlled today. Continue Norvasc and enalapril.   3. HLD: LDL of 47 from 06/2018.  Remains on Vytorin.    Disposition: F/u with Dr. Kirke Corin or an APP in 6 months, sooner if needed.  Current medicines are reviewed at length with the patient today.  The patient did not have any concerns regarding medicines.  Signed, Eula Listen, PA-C 11/13/2018 3:05 PM     [facility] [place] [address] [place], [place] [address] [phone]

## 2018-11-13 ENCOUNTER — Encounter: Payer: Self-pay | Admitting: Physician Assistant

## 2018-11-13 ENCOUNTER — Ambulatory Visit (INDEPENDENT_AMBULATORY_CARE_PROVIDER_SITE_OTHER): Payer: Medicare PPO | Admitting: Physician Assistant

## 2018-11-13 VITALS — BP 106/62 | HR 64 | Ht 60.0 in | Wt 160.5 lb

## 2018-11-13 DIAGNOSIS — I10 Essential (primary) hypertension: Secondary | ICD-10-CM | POA: Diagnosis not present

## 2018-11-13 DIAGNOSIS — I472 Ventricular tachycardia: Secondary | ICD-10-CM

## 2018-11-13 DIAGNOSIS — R002 Palpitations: Secondary | ICD-10-CM | POA: Diagnosis not present

## 2018-11-13 DIAGNOSIS — I471 Supraventricular tachycardia: Secondary | ICD-10-CM | POA: Diagnosis not present

## 2018-11-13 DIAGNOSIS — E782 Mixed hyperlipidemia: Secondary | ICD-10-CM

## 2018-11-13 DIAGNOSIS — R000 Tachycardia, unspecified: Secondary | ICD-10-CM

## 2018-11-13 MED ORDER — PROPRANOLOL HCL 10 MG PO TABS: 10.0000 mg | ORAL_TABLET | Freq: Every day | ORAL | 0 refills | Status: DC | PRN

## 2018-11-13 NOTE — Patient Instructions (Addendum)
Medication Instructions:  Your physician has recommended you make the following change in your medication:  1- START Propranolol Take 1 tablet (10 mg total) by mouth daily as needed (palpitations).  If you need a refill on your cardiac medications before your next appointment, please call your pharmacy.   Lab work: None ordered   If you have labs (blood work) drawn today and your tests are completely normal, you will receive your results only by: Marland Kitchen MyChart Message (if you have MyChart) OR . A paper copy in the mail If you have any lab test that is abnormal or we need to change your treatment, we will call you to review the results.  Testing/Procedures: None ordered   Follow-Up: At [facility], you and your health needs are our priority.  As part of our continuing mission to provide you with exceptional heart care, we have created designated Provider Care Teams.  These Care Teams include your primary Cardiologist (physician) and Advanced Practice Providers (APPs -  Physician Assistants and Nurse Practitioners) who all work together to provide you with the care you need, when you need it. You will need a follow up appointment in 6 months.  Please call our office 2 months in advance to schedule this appointment.  You may see Dr. Kirke Corin or Eula Listen, PA-C  Any Other Special Instructions Will Be Listed Below (If Applicable). Please download Kardia/Alivecor app for EKG program.   $99.00 (Flexible spending Eligible) FDA-cleared, clinical grade personal EKG monitor. Mayford Knife captures a medical-grade EKG in 30 seconds anywhere, anytime. Detect Atrial Fibrillation, Bradycardia, Tachycardia or Normal heart rhythm. Store your EKGs on your phone, and email your EKG to your doctor with the press of a button.  Free shipping 2-5 days Works with most smartphones & tablets on [employer].

## 2019-02-09 ENCOUNTER — Other Ambulatory Visit: Payer: Self-pay | Admitting: Family Medicine

## 2019-02-09 DIAGNOSIS — Z1231 Encounter for screening mammogram for malignant neoplasm of breast: Secondary | ICD-10-CM

## 2019-03-01 DIAGNOSIS — R809 Proteinuria, unspecified: Secondary | ICD-10-CM | POA: Diagnosis not present

## 2019-03-01 DIAGNOSIS — N2581 Secondary hyperparathyroidism of renal origin: Secondary | ICD-10-CM | POA: Diagnosis not present

## 2019-03-01 DIAGNOSIS — N183 Chronic kidney disease, stage 3 (moderate): Secondary | ICD-10-CM | POA: Diagnosis not present

## 2019-03-01 DIAGNOSIS — I10 Essential (primary) hypertension: Secondary | ICD-10-CM | POA: Diagnosis not present

## 2019-03-04 DIAGNOSIS — I10 Essential (primary) hypertension: Secondary | ICD-10-CM | POA: Diagnosis not present

## 2019-03-04 DIAGNOSIS — R809 Proteinuria, unspecified: Secondary | ICD-10-CM | POA: Diagnosis not present

## 2019-03-04 DIAGNOSIS — N183 Chronic kidney disease, stage 3 (moderate): Secondary | ICD-10-CM | POA: Diagnosis not present

## 2019-03-04 DIAGNOSIS — N2581 Secondary hyperparathyroidism of renal origin: Secondary | ICD-10-CM | POA: Diagnosis not present

## 2019-03-30 ENCOUNTER — Other Ambulatory Visit: Payer: Self-pay

## 2019-03-30 ENCOUNTER — Ambulatory Visit
Admission: RE | Admit: 2019-03-30 | Discharge: 2019-03-30 | Disposition: A | Discharge status: Home / Self Care | Payer: Medicare PPO | Source: Ambulatory Visit | Attending: Family Medicine | Admitting: Family Medicine

## 2019-03-30 DIAGNOSIS — Z1231 Encounter for screening mammogram for malignant neoplasm of breast: Secondary | ICD-10-CM | POA: Diagnosis not present

## 2019-03-31 ENCOUNTER — Other Ambulatory Visit: Payer: Self-pay | Admitting: Family Medicine

## 2019-03-31 DIAGNOSIS — N6489 Other specified disorders of breast: Secondary | ICD-10-CM

## 2019-03-31 DIAGNOSIS — R928 Other abnormal and inconclusive findings on diagnostic imaging of breast: Secondary | ICD-10-CM

## 2019-03-31 NOTE — Progress Notes (Signed)
Abnormal Mammogram.  Needs further imaging.  Will be contacted by  imaging office to schedule further imaging per result note.

## 2019-04-09 ENCOUNTER — Ambulatory Visit
Admission: RE | Admit: 2019-04-09 | Discharge: 2019-04-09 | Disposition: A | Discharge status: Home / Self Care | Payer: Medicare PPO | Source: Ambulatory Visit | Attending: Family Medicine | Admitting: Family Medicine

## 2019-04-09 DIAGNOSIS — N6489 Other specified disorders of breast: Secondary | ICD-10-CM | POA: Diagnosis not present

## 2019-04-09 DIAGNOSIS — R928 Other abnormal and inconclusive findings on diagnostic imaging of breast: Secondary | ICD-10-CM | POA: Diagnosis not present

## 2019-06-25 ENCOUNTER — Telehealth: Payer: Self-pay | Admitting: Family Medicine

## 2019-06-25 DIAGNOSIS — R002 Palpitations: Secondary | ICD-10-CM

## 2019-06-25 DIAGNOSIS — I10 Essential (primary) hypertension: Secondary | ICD-10-CM

## 2019-06-25 DIAGNOSIS — E782 Mixed hyperlipidemia: Secondary | ICD-10-CM

## 2019-06-25 NOTE — Telephone Encounter (Signed)
-----   Message from Cloyd Stagers, RT sent at 06/22/2019  2:48 PM EDT ----- Regarding: Lab Orders for Monday 10.12.2020 Please place lab orders for Monday 10.12.2020, office visit for physical on Friday 10.16.2020 Thank you, Dyke Maes RT(R)

## 2019-06-28 ENCOUNTER — Ambulatory Visit (INDEPENDENT_AMBULATORY_CARE_PROVIDER_SITE_OTHER): Payer: Medicare PPO

## 2019-06-28 ENCOUNTER — Ambulatory Visit: Payer: Medicare PPO

## 2019-06-28 ENCOUNTER — Other Ambulatory Visit (INDEPENDENT_AMBULATORY_CARE_PROVIDER_SITE_OTHER): Payer: Medicare PPO

## 2019-06-28 VITALS — Wt 158.0 lb

## 2019-06-28 DIAGNOSIS — E782 Mixed hyperlipidemia: Secondary | ICD-10-CM | POA: Diagnosis not present

## 2019-06-28 DIAGNOSIS — Z0000 Encounter for general adult medical examination without abnormal findings: Secondary | ICD-10-CM

## 2019-06-28 LAB — COMPREHENSIVE METABOLIC PANEL
ALT: 10 U/L (ref 0–35)
AST: 14 U/L (ref 0–37)
Albumin: 4.1 g/dL (ref 3.5–5.2)
Alkaline Phosphatase: 69 U/L (ref 39–117)
BUN: 21 mg/dL (ref 6–23)
CO2: 25 mEq/L (ref 19–32)
Calcium: 9.8 mg/dL (ref 8.4–10.5)
Chloride: 107 mEq/L (ref 96–112)
Creatinine, Ser: 1.19 mg/dL (ref 0.40–1.20)
GFR: 44.03 mL/min — ABNORMAL LOW (ref >60.00)
Glucose, Bld: 105 mg/dL — ABNORMAL HIGH (ref 70–99)
Potassium: 4.3 mEq/L (ref 3.5–5.1)
Sodium: 141 mEq/L (ref 135–145)
Total Bilirubin: 0.3 mg/dL (ref 0.2–1.2)
Total Protein: 6.9 g/dL (ref 6.0–8.3)

## 2019-06-28 LAB — LIPID PANEL
Cholesterol: 164 mg/dL (ref 0–200)
HDL: 74.2 mg/dL (ref >39.00)
LDL Cholesterol: 62 mg/dL (ref 0–99)
NonHDL: 90.16
Total CHOL/HDL Ratio: 2
Triglycerides: 142 mg/dL (ref 0.0–149.0)
VLDL: 28.4 mg/dL (ref 0.0–40.0)

## 2019-06-28 NOTE — Patient Instructions (Signed)
Natasha Sawyer , Thank you for taking time to come for your Medicare Wellness Visit. I appreciate your ongoing commitment to your health goals. Please review the following plan we discussed and let me know if I can assist you in the future.   Screening recommendations/referrals: Colonoscopy: cologuard completed 07/17/2016 Mammogram: up to date, completed 03/30/2019 Bone Density: up to date, completed 02/18/2017 Recommended yearly ophthalmology/optometry visit for glaucoma screening and checkup Recommended yearly dental visit for hygiene and checkup  Vaccinations: Influenza vaccine: will get at next office visit  Pneumococcal vaccine: will get at next office visit  Tdap vaccine: up to date, completed 02/12/2011 Shingles vaccine: declined    Advanced directives: copy in chart  Conditions/risks identified: hypertension, hyperlipidemia   Next appointment: 07/02/2019 @ 10 am    Preventive Care 65 Years and Older, Female Preventive care refers to lifestyle choices and visits with your health care provider that can promote health and wellness. What does preventive care include?  A yearly physical exam. This is also called an annual well check.  Dental exams once or twice a year.  Routine eye exams. Ask your health care provider how often you should have your eyes checked.  Personal lifestyle choices, including:  Daily care of your teeth and gums.  Regular physical activity.  Eating a healthy diet.  Avoiding tobacco and drug use.  Limiting alcohol use.  Practicing safe sex.  Taking low-dose aspirin every day.  Taking vitamin and mineral supplements as recommended by your health care provider. What happens during an annual well check? The services and screenings done by your health care provider during your annual well check will depend on your age, overall health, lifestyle risk factors, and family history of disease. Counseling  Your health care provider may ask you questions  about your:  Alcohol use.  Tobacco use.  Drug use.  Emotional well-being.  Home and relationship well-being.  Sexual activity.  Eating habits.  History of falls.  Memory and ability to understand (cognition).  Work and work Statistician.  Reproductive health. Screening  You may have the following tests or measurements:  Height, weight, and BMI.  Blood pressure.  Lipid and cholesterol levels. These may be checked every 5 years, or more frequently if you are over 68 years old.  Skin check.  Lung cancer screening. You may have this screening every year starting at age 20 if you have a 30-pack-year history of smoking and currently smoke or have quit within the past 15 years.  Fecal occult blood test (FOBT) of the stool. You may have this test every year starting at age 41.  Flexible sigmoidoscopy or colonoscopy. You may have a sigmoidoscopy every 5 years or a colonoscopy every 10 years starting at age 78.  Hepatitis C blood test.  Hepatitis B blood test.  Sexually transmitted disease (STD) testing.  Diabetes screening. This is done by checking your blood sugar (glucose) after you have not eaten for a while (fasting). You may have this done every 1-3 years.  Bone density scan. This is done to screen for osteoporosis. You may have this done starting at age 38.  Mammogram. This may be done every 1-2 years. Talk to your health care provider about how often you should have regular mammograms. Talk with your health care provider about your test results, treatment options, and if necessary, the need for more tests. Vaccines  Your health care provider may recommend certain vaccines, such as:  Influenza vaccine. This is recommended every year.  Tetanus, diphtheria, and acellular pertussis (Tdap, Td) vaccine. You may need a Td booster every 10 years.  Zoster vaccine. You may need this after age 31.  Pneumococcal 13-valent conjugate (PCV13) vaccine. One dose is  recommended after age 15.  Pneumococcal polysaccharide (PPSV23) vaccine. One dose is recommended after age 79. Talk to your health care provider about which screenings and vaccines you need and how often you need them. This information is not intended to replace advice given to you by your health care provider. Make sure you discuss any questions you have with your health care provider. Document Released: 09/29/2015 Document Revised: 05/22/2016 Document Reviewed: 07/04/2015 Elsevier Interactive Patient Education  2017 [place] Prevention in the Home Falls can cause injuries. They can happen to people of all ages. There are many things you can do to make your home safe and to help prevent falls. What can I do on the outside of my home?  Regularly fix the edges of walkways and driveways and fix any cracks.  Remove anything that might make you trip as you walk through a door, such as a raised step or threshold.  Trim any bushes or trees on the path to your home.  Use bright outdoor lighting.  Clear any walking paths of anything that might make someone trip, such as rocks or tools.  Regularly check to see if handrails are loose or broken. Make sure that both sides of any steps have handrails.  Any raised decks and porches should have guardrails on the edges.  Have any leaves, snow, or ice cleared regularly.  Use sand or salt on walking paths during winter.  Clean up any spills in your garage right away. This includes oil or grease spills. What can I do in the bathroom?  Use night lights.  Install grab bars by the toilet and in the tub and shower. Do not use towel bars as grab bars.  Use non-skid mats or decals in the tub or shower.  If you need to sit down in the shower, use a plastic, non-slip stool.  Keep the floor dry. Clean up any water that spills on the floor as soon as it happens.  Remove soap buildup in the tub or shower regularly.  Attach bath mats  securely with double-sided non-slip rug tape.  Do not have throw rugs and other things on the floor that can make you trip. What can I do in the bedroom?  Use night lights.  Make sure that you have a light by your bed that is easy to reach.  Do not use any sheets or blankets that are too big for your bed. They should not hang down onto the floor.  Have a firm chair that has side arms. You can use this for support while you get dressed.  Do not have throw rugs and other things on the floor that can make you trip. What can I do in the kitchen?  Clean up any spills right away.  Avoid walking on wet floors.  Keep items that you use a lot in easy-to-reach places.  If you need to reach something above you, use a strong step stool that has a grab bar.  Keep electrical cords out of the way.  Do not use floor polish or wax that makes floors slippery. If you must use wax, use non-skid floor wax.  Do not have throw rugs and other things on the floor that can make you trip. What can I do  with my stairs?  Do not leave any items on the stairs.  Make sure that there are handrails on both sides of the stairs and use them. Fix handrails that are broken or loose. Make sure that handrails are as long as the stairways.  Check any carpeting to make sure that it is firmly attached to the stairs. Fix any carpet that is loose or worn.  Avoid having throw rugs at the top or bottom of the stairs. If you do have throw rugs, attach them to the floor with carpet tape.  Make sure that you have a light switch at the top of the stairs and the bottom of the stairs. If you do not have them, ask someone to add them for you. What else can I do to help prevent falls?  Wear shoes that:  Do not have high heels.  Have rubber bottoms.  Are comfortable and fit you well.  Are closed at the toe. Do not wear sandals.  If you use a stepladder:  Make sure that it is fully opened. Do not climb a closed  stepladder.  Make sure that both sides of the stepladder are locked into place.  Ask someone to hold it for you, if possible.  Clearly mark and make sure that you can see:  Any grab bars or handrails.  First and last steps.  Where the edge of each step is.  Use tools that help you move around (mobility aids) if they are needed. These include:  Canes.  Walkers.  Scooters.  Crutches.  Turn on the lights when you go into a dark area. Replace any light bulbs as soon as they burn out.  Set up your furniture so you have a clear path. Avoid moving your furniture around.  If any of your floors are uneven, fix them.  If there are any pets around you, be aware of where they are.  Review your medicines with your doctor. Some medicines can make you feel dizzy. This can increase your chance of falling. Ask your doctor what other things that you can do to help prevent falls. This information is not intended to replace advice given to you by your health care provider. Make sure you discuss any questions you have with your health care provider. Document Released: 06/29/2009 Document Revised: 02/08/2016 Document Reviewed: 10/07/2014 Elsevier Interactive Patient Education  2017 [employer].

## 2019-06-28 NOTE — Progress Notes (Signed)
No critical labs need to be addressed urgently. We will discuss labs in detail at upcoming office visit.   

## 2019-06-28 NOTE — Progress Notes (Signed)
PCP notes: none  Health Maintenance: Patient will get flu and pneumovax 23 vaccine at next office visit. Declined Shingrix.     Abnormal Screenings: none    Patient concerns: none    Nurse concerns: none    Next PCP appt.: 07/02/2019 @ 10 am

## 2019-06-28 NOTE — Progress Notes (Signed)
Subjective:   Natasha Sawyer is a 78 y.o. female who presents for Medicare Annual (Subsequent) preventive examination.  Review of Systems:    This visit is being conducted through telemedicine via telephone at the nurse health advisor's home address due to the COVID-19 pandemic. This patient has given me verbal consent via doximity to conduct this visit, patient states they are participating from their home address. Some vital signs may be absent or patient reported.    Patient identification: identified by name, DOB, and current address  Cardiac Risk Factors include: advanced age (>4men, >85 women);hypertension;dyslipidemia     Objective:     Vitals: Wt 158 lb (71.7 kg)   BMI 30.86 kg/m   Body mass index is 30.86 kg/m.  Advanced Directives 06/28/2019 06/25/2018 06/16/2017 06/12/2016  Does Patient Have a Medical Advance Directive? Yes Yes Yes Yes  Type of Estate agent of [place];Living will Healthcare Power of [place];Living will Living will;Healthcare Power of [employer] Power of [place];Living will  Does patient want to make changes to medical advance directive? - - - No - Patient declined  Copy of Healthcare Power of Attorney in Chart? Yes - validated most recent copy scanned in chart (See row information) No - copy requested No - copy requested No - copy requested    Tobacco Social History   Tobacco Use  Smoking Status Never Smoker  Smokeless Tobacco Never Used     Counseling given: Not Answered   Clinical Intake:  Pre-visit preparation completed: Yes  Pain : No/denies pain     Nutritional Risks: None Diabetes: No  How often do you need to have someone help you when you read instructions, pamphlets, or other written materials from your doctor or pharmacy?: 1 - Never What is the last grade level you completed in school?: college  Interpreter Needed?: No  Information entered by :: CJohnson, LPNc  Past Medical History:   Diagnosis Date  . Chronic kidney disease   . Hyperlipidemia   . Hypertension   . Premature beats    Past Surgical History:  Procedure Laterality Date  . APPENDECTOMY    . CESAREAN SECTION    . EYE SURGERY     cataract and eyelid  . HERNIA REPAIR     Family History  Problem Relation Age of Onset  . Diabetes Mother   . Stroke Father   . Breast cancer Cousin 74       maternal   Social History   Socioeconomic History  . Marital status: Widowed    Spouse name: Not on file  . Number of children: Not on file  . Years of education: Not on file  . Highest education level: Not on file  Occupational History  . Not on file  Social Needs  . Financial resource strain: Not hard at all  . Food insecurity    Worry: Never true    Inability: Never true  . Transportation needs    Medical: No    Non-medical: No  Tobacco Use  . Smoking status: Never Smoker  . Smokeless tobacco: Never Used  Substance and Sexual Activity  . Alcohol use: No  . Drug use: No  . Sexual activity: Not Currently  Lifestyle  . Physical activity    Days per week: 0 days    Minutes per session: 0 min  . Stress: Not at all  Relationships  . Social Musician on phone: Not on file    Gets  together: Not on file    Attends religious service: Not on file    Active member of club or organization: Not on file    Attends meetings of clubs or organizations: Not on file    Relationship status: Not on file  Other Topics Concern  . Not on file  Social History Narrative   Lives at [facility]    HAs living will, full code    Outpatient Encounter Medications as of 06/28/2019  Medication Sig  . Acetaminophen (TYLENOL ARTHRITIS PAIN PO) Take 650 mg by mouth daily.  Marland Kitchen. amLODipine (NORVASC) 10 MG tablet Take 10 mg by mouth daily.  . enalapril (VASOTEC) 20 MG tablet Take 20 mg by mouth daily.  . Ergocalciferol (VITAMIN D2) 2000 UNITS TABS Take 1 tablet by mouth daily.  Marland Kitchen. ezetimibe-simvastatin (VYTORIN)  10-20 MG tablet TAKE 1 TABLET AT BEDTIME  . propranolol (INDERAL) 10 MG tablet Take 1 tablet (10 mg total) by mouth daily as needed (palpitations).  . zolpidem (AMBIEN) 5 MG tablet Take 2.5 mg by mouth at bedtime as needed.    No facility-administered encounter medications on file as of 06/28/2019.     Activities of Daily Living In your present state of health, do you have any difficulty performing the following activities: 06/28/2019  Hearing? N  Vision? N  Difficulty concentrating or making decisions? N  Walking or climbing stairs? N  Dressing or bathing? N  Doing errands, shopping? N  Preparing Food and eating ? N  Using the Toilet? N  In the past six months, have you accidently leaked urine? N  Do you have problems with loss of bowel control? N  Managing your Medications? N  Managing your Finances? N  Housekeeping or managing your Housekeeping? N  Some recent data might be hidden    Patient Care Team: Excell SeltzerBedsole, Amy E, MD as PCP - General Mady HaagensenLateef, Munsoor, MD as Consulting Physician (Internal Medicine)    Assessment:   This is a routine wellness examination for Natasha Sawyer.  Exercise Activities and Dietary recommendations Current Exercise Habits: The patient does not participate in regular exercise at present, Exercise limited by: None identified  Goals    . Patient Stated     06/28/2019, Patient wants to work on losing some weight soon.     . physical     Starting 06/25/2018, I will continue to walk 1-2 miles 5 days per week.        Fall Risk Fall Risk  06/28/2019 06/25/2018 06/16/2017 06/12/2016  Falls in the past year? 0 No Yes No  Comment - - pt lost balance and fell out of bathroom door; injury to head. no LOC or medical treatment -  Number falls in past yr: - - 1 -  Injury with Fall? - - Yes -  Risk for fall due to : Medication side effect - - -  Follow up Falls evaluation completed;Falls prevention discussed - - -   Is the patient's home free of loose throw rugs  in walkways, pet beds, electrical cords, etc?   yes      Grab bars in the bathroom? yes      Handrails on the stairs?   yes      Adequate lighting?   yes  Timed Get Up and Go performed: n/a  Depression Screen PHQ 2/9 Scores 06/28/2019 06/25/2018 06/16/2017 06/12/2016  PHQ - 2 Score 0 0 0 0  PHQ- 9 Score 0 0 0 -     Cognitive Function  MMSE - Mini Mental State Exam 06/28/2019 06/25/2018 06/16/2017 06/12/2016  Orientation to time 5 5 5 5   Orientation to Place 5 5 5 5   Registration 3 3 3 3   Attention/ Calculation 5 0 0 0  Recall 3 3 3 3   Language- name 2 objects - 0 0 0  Language- repeat 1 1 1 1   Language- follow 3 step command - 3 3 3   Language- read & follow direction - 0 0 0  Write a sentence - 0 0 0  Copy design - 0 0 0  Total score - 20 20 20   Mini Cog  Mini-Cog screen was completed. Maximum score is 22. A value of 0 denotes this part of the MMSE was not completed or the patient failed this part of the Mini-Cog screening.      Immunization History  Administered Date(s) Administered  . Influenza,inj,Quad PF,6+ Mos 06/25/2018  . Influenza-Unspecified 06/16/2013  . Pneumococcal Conjugate-13 06/25/2018  . Zoster 02/13/2012    Qualifies for Shingles Vaccine? yes  Screening Tests Health Maintenance  Topic Date Due  . INFLUENZA VACCINE  04/17/2019  . PNA vac Low Risk Adult (2 of 2 - PPSV23) 06/26/2019  . MAMMOGRAM  03/29/2020  . TETANUS/TDAP  02/11/2021  . DEXA SCAN  Completed    Cancer Screenings: Lung: Low Dose CT Chest recommended if Age 56-80 years, 30 pack-year currently smoking OR have quit w/in 15years. Patient does not qualify. Breast:  Up to date on Mammogram? Yes, completed 03/30/2019   Up to date of Bone Density/Dexa? Yes, completed 02/18/2017 Colorectal: completed Cologuard 07/17/2016  Additional Screenings:  Hepatitis C Screening: n/a     Plan:    Patient wants to work on losing some weight soon.    I have personally reviewed and noted the following in  the patient's chart:   . Medical and social history . Use of alcohol, tobacco or illicit drugs  . Current medications and supplements . Functional ability and status . Nutritional status . Physical activity . Advanced directives . List of other physicians . Hospitalizations, surgeries, and ER visits in previous 12 months . Vitals . Screenings to include cognitive, depression, and falls . Referrals and appointments  In addition, I have reviewed and discussed with patient certain preventive protocols, quality metrics, and best practice recommendations. A written personalized care plan for preventive services as well as general preventive health recommendations were provided to patient.     Andrez Grime, LPN  [id number]

## 2019-07-02 ENCOUNTER — Encounter: Payer: Self-pay | Admitting: Family Medicine

## 2019-07-02 ENCOUNTER — Other Ambulatory Visit: Payer: Self-pay

## 2019-07-02 ENCOUNTER — Ambulatory Visit (INDEPENDENT_AMBULATORY_CARE_PROVIDER_SITE_OTHER): Payer: Medicare PPO | Admitting: Family Medicine

## 2019-07-02 VITALS — BP 130/82 | HR 60 | Temp 98.3°F | Ht 59.5 in | Wt 159.0 lb

## 2019-07-02 DIAGNOSIS — Z0000 Encounter for general adult medical examination without abnormal findings: Secondary | ICD-10-CM

## 2019-07-02 DIAGNOSIS — N1832 Chronic kidney disease, stage 3b: Secondary | ICD-10-CM | POA: Diagnosis not present

## 2019-07-02 DIAGNOSIS — Z23 Encounter for immunization: Secondary | ICD-10-CM | POA: Diagnosis not present

## 2019-07-02 DIAGNOSIS — R5383 Other fatigue: Secondary | ICD-10-CM | POA: Diagnosis not present

## 2019-07-02 DIAGNOSIS — I10 Essential (primary) hypertension: Secondary | ICD-10-CM

## 2019-07-02 DIAGNOSIS — E782 Mixed hyperlipidemia: Secondary | ICD-10-CM | POA: Diagnosis not present

## 2019-07-02 DIAGNOSIS — R531 Weakness: Secondary | ICD-10-CM | POA: Insufficient documentation

## 2019-07-02 NOTE — Assessment & Plan Note (Signed)
Well controlled. Continue current medication.  

## 2019-07-02 NOTE — Assessment & Plan Note (Signed)
Stable followed buy Dr. Holley Raring

## 2019-07-02 NOTE — Patient Instructions (Addendum)
Call if fatigue is not improving .. for lab only visit.  keep up the regular exercise.  Discuss the fatigue with cardiology.  Work on low sugar low carb diet.

## 2019-07-02 NOTE — Assessment & Plan Note (Signed)
Well controlled. Continue current medication. Encouraged exercise, weight loss, healthy eating habits.  

## 2019-07-02 NOTE — Assessment & Plan Note (Signed)
May be due to decreased activity.  No clear cardiopulmonary symptoms.  Offered eval with labs .. pt will consider.

## 2019-07-02 NOTE — Progress Notes (Signed)
Chief Complaint  Patient presents with  . Annual Exam    Part 2    History of Present Illness: HPI The patient presents for complete physical and review of chronic health problems. He/She also has the following acute concerns today: fatigue  Started  In last several months.  She is active volunteering.. but cannot do it given COVID. Has been less active.  Wakes up feeling tired and has less endurance.  Stable mood overall, no depression or anxiety. No CP no SOB.  No change in cold intolerance. No bleeding.  The patient saw a LPN or RN for medicare wellness visit.  Prevention and wellness was reviewed in detail. Note reviewed and important notes copied below. Health Maintenance: Patient will get flu and pneumovax 23 vaccine at next office visit. Declined Shingrix.  Abnormal Screenings: none  Elevated Cholesterol:  Good control on Vytorin Lab Results  Component Value Date   CHOL 164 06/28/2019   HDL 74.20 06/28/2019   LDLCALC 62 06/28/2019   TRIG 142.0 06/28/2019   CHOLHDL 2 06/28/2019  Using medications without problems:none Muscle aches: none Diet compliance: moderate Exercise: walking daily 1 mile Other complaints:  Hypertension:    Stable control on enalapril and amlodipine BP Readings from Last 3 Encounters:  07/02/19 130/82  11/13/18 106/62  08/07/18 124/80  Using medication without problems or lightheadedness: none Chest pain with exertion:none Edema:none Short of breath:mild Average home BPs: Other issues:  Has appt with Dr. Kirke Corin cardiology next month for palpitations.Marland Kitchen only occurring 1-2 times a year... last episode 2-3 weeks ago, associated with nausea.  CKD; followed by nephrology, Dr. Cherylann Ratel,  stable Cr  1.19 GFR 44  COVID 19 screen No recent travel or known exposure to COVID19 The patient denies respiratory symptoms of COVID 19 at this time.  The importance of social distancing was discussed today.   Review of Systems  Constitutional: Negative  for chills and fever.  HENT: Negative for congestion and ear pain.   Eyes: Negative for pain and redness.  Respiratory: Negative for cough and shortness of breath.   Cardiovascular: Negative for chest pain, palpitations and leg swelling.  Gastrointestinal: Negative for abdominal pain, blood in stool, constipation, diarrhea, nausea and vomiting.  Genitourinary: Negative for dysuria.  Musculoskeletal: Negative for falls and myalgias.  Skin: Negative for rash.  Neurological: Negative for dizziness.  Psychiatric/Behavioral: Negative for depression. The patient is not nervous/anxious.       Past Medical History:  Diagnosis Date  . Chronic kidney disease   . Hyperlipidemia   . Hypertension   . Premature beats     reports that she has never smoked. She has never used smokeless tobacco. She reports that she does not drink alcohol or use drugs.   Current Outpatient Medications:  .  Acetaminophen (TYLENOL ARTHRITIS PAIN PO), Take 650 mg by mouth daily., Disp: , Rfl:  .  amLODipine (NORVASC) 10 MG tablet, Take 10 mg by mouth daily., Disp: , Rfl:  .  enalapril (VASOTEC) 20 MG tablet, Take 20 mg by mouth daily., Disp: , Rfl:  .  Ergocalciferol (VITAMIN D2) 2000 UNITS TABS, Take 1 tablet by mouth daily., Disp: , Rfl:  .  ezetimibe-simvastatin (VYTORIN) 10-20 MG tablet, TAKE 1 TABLET AT BEDTIME, Disp: 90 tablet, Rfl: 3 .  propranolol (INDERAL) 10 MG tablet, Take 1 tablet (10 mg total) by mouth daily as needed (palpitations)., Disp: 12 tablet, Rfl: 0 .  zolpidem (AMBIEN) 5 MG tablet, Take 2.5 mg by mouth at bedtime as  needed. , Disp: , Rfl:    Observations/Objective: Blood pressure 130/82, pulse 60, temperature 98.3 F (36.8 C), temperature source Temporal, height 4' 11.5" (1.511 m), weight 159 lb (72.1 kg), SpO2 97 %.  Physical Exam Constitutional:      General: She is not in acute distress.    Appearance: Normal appearance. She is well-developed. She is not ill-appearing or toxic-appearing.   HENT:     Head: Normocephalic.     Right Ear: Hearing, tympanic membrane, ear canal and external ear normal.     Left Ear: Hearing, tympanic membrane, ear canal and external ear normal.     Nose: Nose normal.  Eyes:     General: Lids are normal. Lids are everted, no foreign bodies appreciated.     Conjunctiva/sclera: Conjunctivae normal.     Pupils: Pupils are equal, round, and reactive to light.  Neck:     Musculoskeletal: Normal range of motion and neck supple.     Thyroid: No thyroid mass or thyromegaly.     Vascular: No carotid bruit.     Trachea: Trachea normal.  Cardiovascular:     Rate and Rhythm: Normal rate and regular rhythm.     Heart sounds: Normal heart sounds, S1 normal and S2 normal. No murmur. No gallop.   Pulmonary:     Effort: Pulmonary effort is normal. No respiratory distress.     Breath sounds: Normal breath sounds. No wheezing, rhonchi or rales.  Abdominal:     General: Bowel sounds are normal. There is no distension or abdominal bruit.     Palpations: Abdomen is soft. There is no fluid wave or mass.     Tenderness: There is no abdominal tenderness. There is no guarding or rebound.     Hernia: No hernia is present.  Lymphadenopathy:     Cervical: No cervical adenopathy.  Skin:    General: Skin is warm and dry.     Findings: No rash.  Neurological:     Mental Status: She is alert.     Cranial Nerves: No cranial nerve deficit.     Sensory: No sensory deficit.  Psychiatric:        Mood and Affect: Mood is not anxious or depressed.        Speech: Speech normal.        Behavior: Behavior normal. Behavior is cooperative.        Judgment: Judgment normal.      Assessment and Plan   The patient's preventative maintenance and recommended screening tests for an annual wellness exam were reviewed in full today. Brought up to date unless services declined.  Counselled on the importance of diet, exercise, and its role in overall health and mortality. The  patient's FH and SH was reviewed, including their home life, tobacco status, and drug and alcohol status.   Mammo: 03/30/2019 PAP/DVE: last pap 2012 nml, okay with stopping these., asymptomatic, no family history of ovarianor uterine cancer: Colon: last done many years ago 2006 : nml,Oncologuard.No further needed at this time. Nonsmoker Flu vaccine - given flu and PNA vaccine today Bone density -02/2017 ostopenia  Hyperlipidemia Well controlled. Continue current medication. Encouraged exercise, weight loss, healthy eating habits.   Essential hypertension, benign Well controlled. Continue current medication.   CHRONIC KIDNEY DISEASE STAGE III (MODERATE) Stable followed buy Dr. Holley Raring  Fatigue May be due to decreased activity.  No clear cardiopulmonary symptoms.  Offered eval with labs .. pt will consider.    Eliezer Lofts, MD

## 2019-07-08 DIAGNOSIS — I10 Essential (primary) hypertension: Secondary | ICD-10-CM | POA: Diagnosis not present

## 2019-07-08 DIAGNOSIS — N2581 Secondary hyperparathyroidism of renal origin: Secondary | ICD-10-CM | POA: Diagnosis not present

## 2019-07-08 DIAGNOSIS — N1831 Chronic kidney disease, stage 3a: Secondary | ICD-10-CM | POA: Diagnosis not present

## 2019-07-08 DIAGNOSIS — R809 Proteinuria, unspecified: Secondary | ICD-10-CM | POA: Diagnosis not present

## 2019-07-12 DIAGNOSIS — N1831 Chronic kidney disease, stage 3a: Secondary | ICD-10-CM | POA: Diagnosis not present

## 2019-07-23 ENCOUNTER — Encounter: Payer: Self-pay | Admitting: Cardiovascular Disease

## 2019-07-23 ENCOUNTER — Other Ambulatory Visit: Payer: Self-pay

## 2019-07-23 ENCOUNTER — Ambulatory Visit (INDEPENDENT_AMBULATORY_CARE_PROVIDER_SITE_OTHER): Payer: Medicare PPO | Admitting: Cardiovascular Disease

## 2019-07-23 VITALS — BP 120/70 | HR 59 | Ht 60.0 in | Wt 157.0 lb

## 2019-07-23 DIAGNOSIS — E782 Mixed hyperlipidemia: Secondary | ICD-10-CM | POA: Diagnosis not present

## 2019-07-23 DIAGNOSIS — R002 Palpitations: Secondary | ICD-10-CM | POA: Diagnosis not present

## 2019-07-23 DIAGNOSIS — I471 Supraventricular tachycardia: Secondary | ICD-10-CM | POA: Diagnosis not present

## 2019-07-23 DIAGNOSIS — I10 Essential (primary) hypertension: Secondary | ICD-10-CM

## 2019-07-23 NOTE — Patient Instructions (Signed)
Medication Instructions:  Your physician recommends that you continue on your current medications as directed. Please refer to the Current Medication list given to you today.  *If you need a refill on your cardiac medications before your next appointment, please call your pharmacy*  Lab Work: None ordered If you have labs (blood work) drawn today and your tests are completely normal, you will receive your results only by: . MyChart Message (if you have MyChart) OR . A paper copy in the mail If you have any lab test that is abnormal or we need to change your treatment, we will call you to review the results.  Testing/Procedures: None ordered  Follow-Up: At [facility], you and your health needs are our priority.  As part of our continuing mission to provide you with exceptional heart care, we have created designated Provider Care Teams.  These Care Teams include your primary Cardiologist (physician) and Advanced Practice Providers (APPs -  Physician Assistants and Nurse Practitioners) who all work together to provide you with the care you need, when you need it.  Your next appointment:   6 month(s)  The format for your next appointment:   In Person  Provider:    You may see Dr. Arida or one of the following Advanced Practice Providers on your designated Care Team:    Christopher Berge, NP  Ryan Dunn, PA-C  Jacquelyn Visser, PA-C   Other Instructions N/A  

## 2019-07-23 NOTE — Progress Notes (Signed)
Cardiology Office Note   Date:  07/23/2019   ID:  Natasha Sawyer, DOB [birth year], MRN [id number]  PCP:  Excell Seltzer, MD  Cardiologist:   Lorine Bears, MD   Chief Complaint  Patient presents with  . Other    6 month follow up. Patient c.o being tired. Meds reviewed verbally with patient.       History of Present Illness: Natasha Sawyer is a 78 y.o. female who is here today for follow-up visit regarding palpitations and shortness of breath.   She has multiple chronic medical conditions including essential hypertension, hyperlipidemia and mild chronic kidney disease. He was initially seen in 2013 for palpitations, atypical chest pain and shortness of breath.  She underwent a treadmill stress test which was normal.  Echocardiogram showed normal ejection fraction with no significant valvular abnormalities.  48-hour Holter monitor showed normal sinus rhythm with frequent PACs and no other significant arrhythmia.  She was seen again in 2019 for recurrent palpitations.  She had a Zio patch monitor which showed normal sinus rhythm with a 4 beat run of wide-complex tachycardia, 18 episodes of SVT the longest lasted 16 beats and occasional PACs and PVCs.  She underwent a treadmill stress test in December 2019 which showed no evidence of ischemia.  She was able to exercise for 3 minutes and 39 seconds. Echocardiogram in January of this year showed normal LV systolic function, mild mitral regurgitation and normal pulmonary pressure. Due to baseline bradycardia, she was not started on any medications but was given propranolol to be used as needed.  She has been doing reasonably well overall.  Since her last visit, she reports only 1 episode of tachycardia with a heart rate of 106 bpm.  This lasted for about 15 minutes and resolved with resting.  She denies chest pain or shortness of breath.  She does complain of increased fatigue.  No dizziness or syncope.   Past Medical History:  Diagnosis  Date  . Chronic kidney disease   . Hyperlipidemia   . Hypertension   . Premature beats     Past Surgical History:  Procedure Laterality Date  . APPENDECTOMY    . CESAREAN SECTION    . EYE SURGERY     cataract and eyelid  . HERNIA REPAIR       Current Outpatient Medications  Medication Sig Dispense Refill  . Acetaminophen (TYLENOL ARTHRITIS PAIN PO) Take 650 mg by mouth daily.    Marland Kitchen amLODipine (NORVASC) 10 MG tablet Take 10 mg by mouth daily.    . enalapril (VASOTEC) 20 MG tablet Take 20 mg by mouth daily.    . Ergocalciferol (VITAMIN D2) 2000 UNITS TABS Take 1 tablet by mouth daily.    Marland Kitchen ezetimibe-simvastatin (VYTORIN) 10-20 MG tablet TAKE 1 TABLET AT BEDTIME 90 tablet 3  . propranolol (INDERAL) 10 MG tablet Take 1 tablet (10 mg total) by mouth daily as needed (palpitations). 12 tablet 0  . zolpidem (AMBIEN) 5 MG tablet Take 2.5 mg by mouth at bedtime as needed.      No current facility-administered medications for this visit.     Allergies:   Patient has no known allergies.    Social History:  The patient  reports that she has never smoked. She has never used smokeless tobacco. She reports that she does not drink alcohol or use drugs.   Family History:  The patient's family history includes Breast cancer (age of onset: 21) in her cousin; Diabetes in  her mother; Stroke in her father.    ROS:  Please see the history of present illness.   Otherwise, review of systems are positive for none.   All other systems are reviewed and negative.    PHYSICAL EXAM: VS:  BP 120/70 (BP Location: Left Arm, Patient Position: Sitting, Cuff Size: Normal)   Pulse (!) 59   Ht 5' (1.524 m)   Wt 157 lb (71.2 kg)   BMI 30.66 kg/m  , BMI Body mass index is 30.66 kg/m. GEN: Well nourished, well developed, in no acute distress  HEENT: normal  Neck: no JVD, carotid bruits, or masses Cardiac: RRR; no murmurs, rubs, or gallops,no edema  Respiratory:  clear to auscultation bilaterally, normal  work of breathing GI: soft, nontender, nondistended, + BS MS: no deformity or atrophy  Skin: warm and dry, no rash Neuro:  Strength and sensation are intact Psych: euthymic mood, full affect   EKG:  EKG is ordered today. The ekg ordered today demonstrates ectopic atrial rhythm with a heart rate 59 bpm.  PVCs.   Recent Labs: 06/28/2019: ALT 10; BUN 21; Creatinine, Ser 1.19; Potassium 4.3; Sodium 141    Lipid Panel    Component Value Date/Time   CHOL 164 06/28/2019 0928   TRIG 142.0 06/28/2019 0928   TRIG 68 12/12/2009 1413   HDL 74.20 06/28/2019 0928   CHOLHDL 2 06/28/2019 0928   VLDL 28.4 06/28/2019 0928   LDLCALC 62 06/28/2019 0928      Wt Readings from Last 3 Encounters:  07/23/19 157 lb (71.2 kg)  07/02/19 159 lb (72.1 kg)  06/28/19 158 lb (71.7 kg)        PAD Screen 08/07/2018  Previous PAD dx? No  Previous surgical procedure? No  Pain with walking? No  Feet/toe relief with dangling? No  Painful, non-healing ulcers? No  Extremities discolored? No      ASSESSMENT AND PLAN:  1.  Paroxysmal supraventricular tachycardia: Overall, her episodes are not frequent enough and do not last long enough to require adding a medication a regular basis.  Continue with propranolol to be used as needed.  In addition, she has underlying bradycardia.  If her episodes become more frequent, she might require a permanent pacemaker in order to effectively treat her SVT.  For now, she appears to be stable.  2.  Fatigue: Not entirely clear about the etiology.  Her cardiac work-up has been negative.  3.  Essential hypertension: Blood pressure is controlled.   4.  Hyperlipidemia: Currently on Vytorin with most recent LDL of 47.    Disposition:   FU in 6 months.  Signed,  Kathlyn Sacramento, MD  07/23/2019 9:56 AM    [place]

## 2019-08-09 IMAGING — DX DG CHEST 2V
2 series · 2 of 2 positions shown · non-contrast
Comparison: None.

CLINICAL DATA: 76-year-old female with persistent cough after
antibiotics. Initial encounter.

EXAM:
CHEST  2 VIEW

[chest pa]
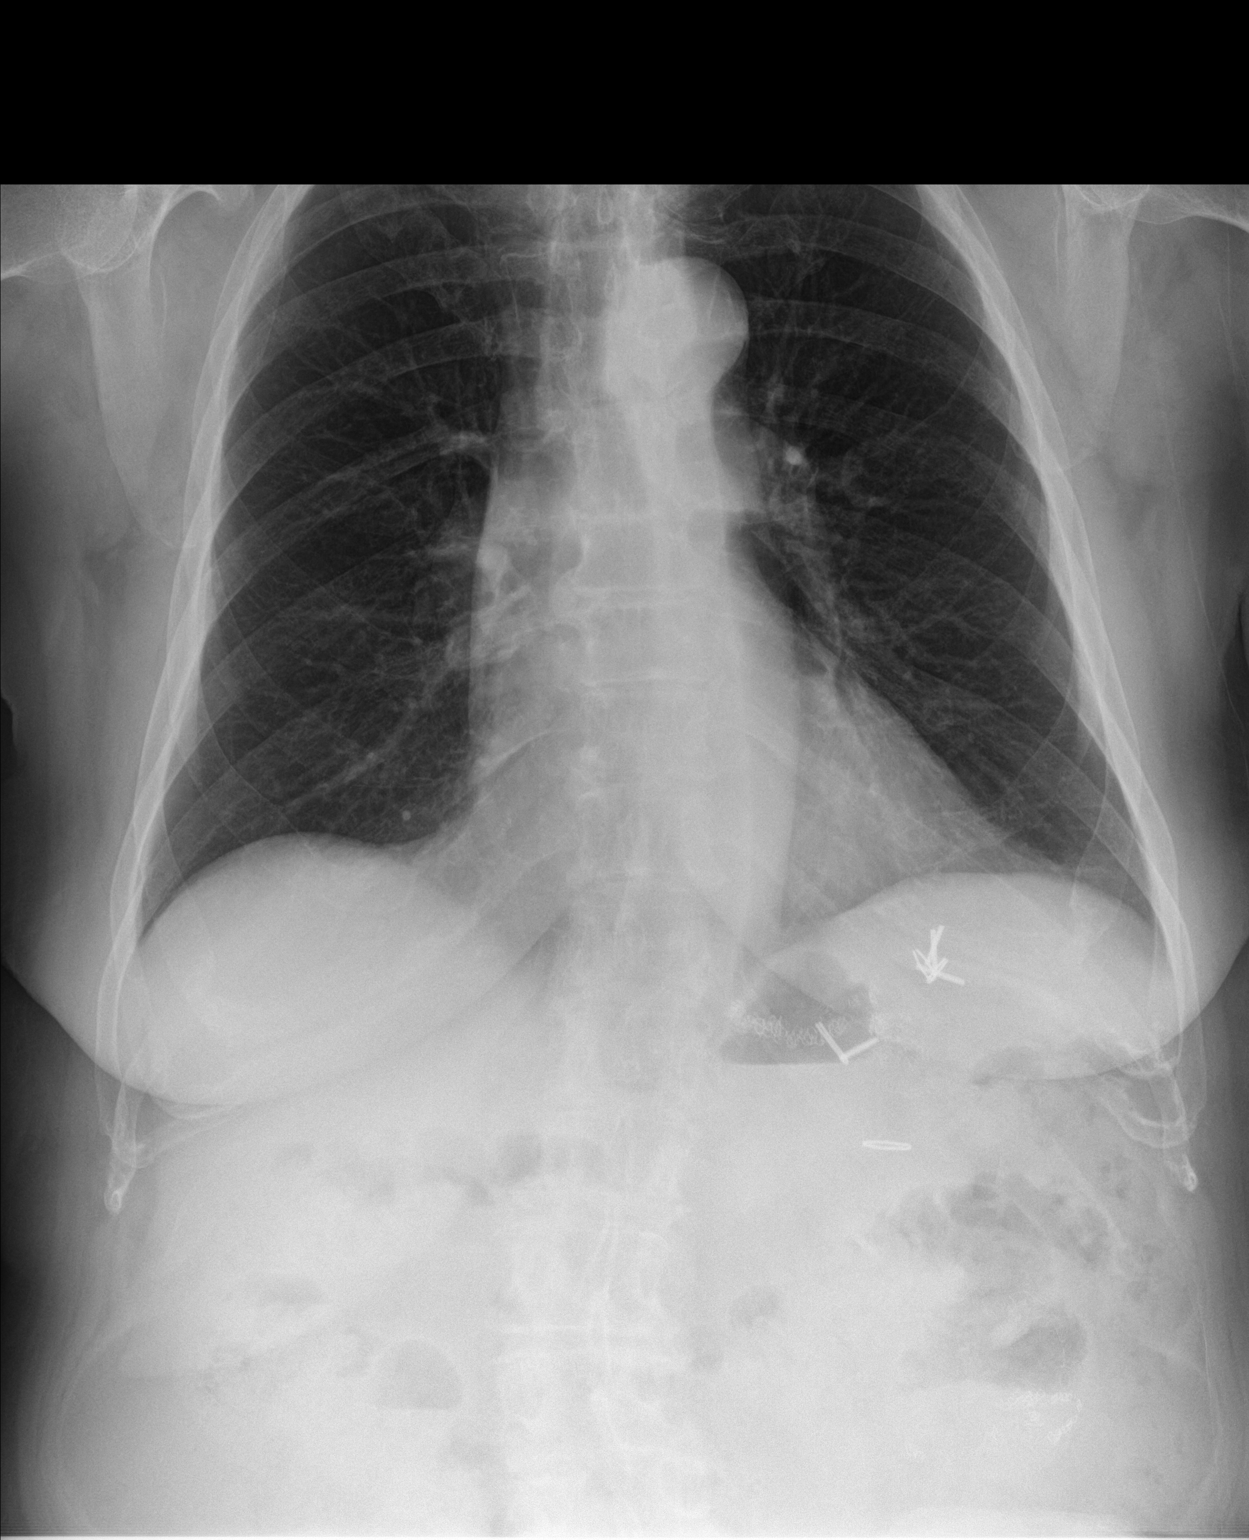

[chest lat]
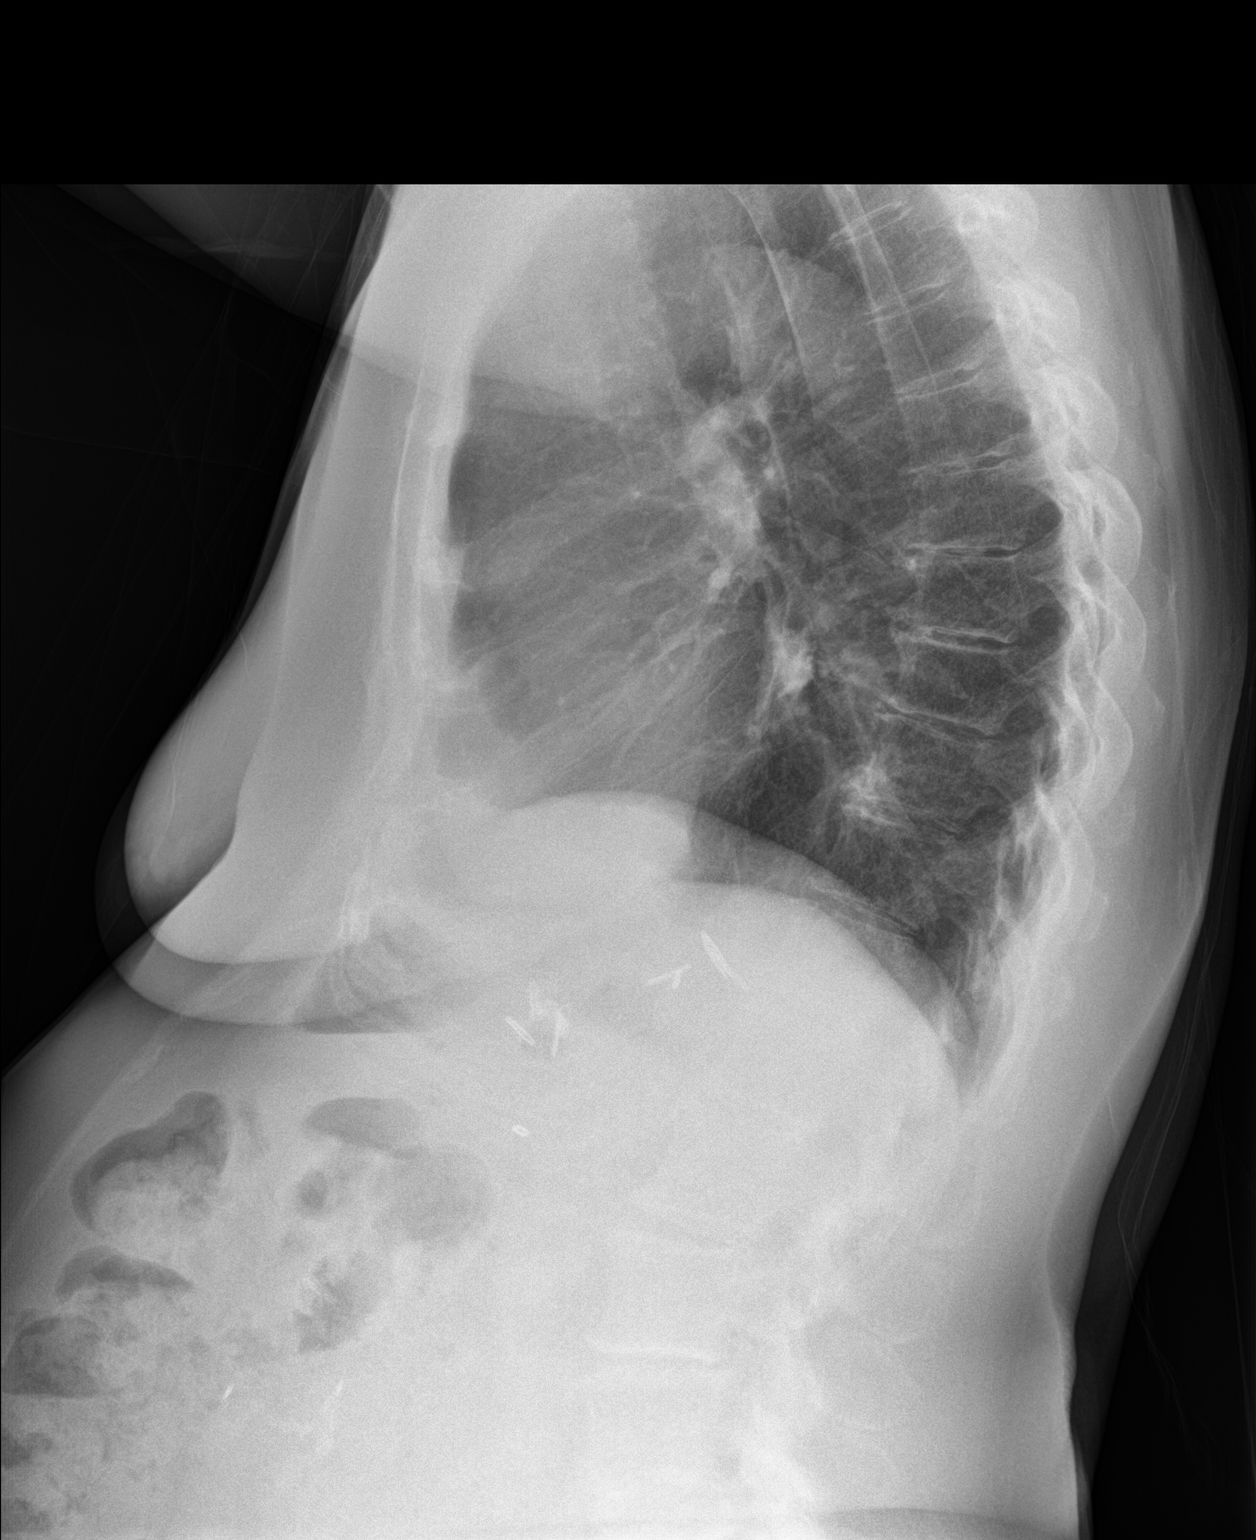

[2 of 2 positions shown; findings below may reference images not displayed]

FINDINGS: No infiltrate, congestive heart failure or pneumothorax.

Subcentimeter opacity projects over the anterior aspect of the right
first rib, possibly confluence of shadows however, CT chest
recommended to exclude small stellate lesion. No comparisons are
available to determine if this represents normal finding for this
patient.

Minimally calcified tortuous aorta.

Heart size within normal limits.

Scoliosis thoracic and lumbar spine with superimposed degenerative
changes without focal compression fracture.

Postsurgical changes upper abdomen.  Nonspecific bowel gas pattern.
IMPRESSION: No infiltrate noted.

Subcentimeter opacity right upper lobe may represent confluence of
shadows however CT chest recommended to exclude small stellate
lesion.

Minimally calcified tortuous aorta.

These results will be called to the ordering clinician or
representative by the Radiologist Assistant, and communication
documented in the PACS or zVision Dashboard.

## 2019-08-22 ENCOUNTER — Other Ambulatory Visit: Payer: Self-pay | Admitting: Family Medicine

## 2019-09-20 DIAGNOSIS — Z20828 Contact with and (suspected) exposure to other viral communicable diseases: Secondary | ICD-10-CM | POA: Diagnosis not present

## 2019-11-08 DIAGNOSIS — N2581 Secondary hyperparathyroidism of renal origin: Secondary | ICD-10-CM | POA: Diagnosis not present

## 2019-11-08 DIAGNOSIS — N1832 Chronic kidney disease, stage 3b: Secondary | ICD-10-CM | POA: Diagnosis not present

## 2019-11-08 DIAGNOSIS — R809 Proteinuria, unspecified: Secondary | ICD-10-CM | POA: Diagnosis not present

## 2019-11-08 DIAGNOSIS — I10 Essential (primary) hypertension: Secondary | ICD-10-CM | POA: Diagnosis not present

## 2019-11-11 DIAGNOSIS — I10 Essential (primary) hypertension: Secondary | ICD-10-CM | POA: Diagnosis not present

## 2019-11-11 DIAGNOSIS — N1831 Chronic kidney disease, stage 3a: Secondary | ICD-10-CM | POA: Diagnosis not present

## 2019-11-11 DIAGNOSIS — R809 Proteinuria, unspecified: Secondary | ICD-10-CM | POA: Diagnosis not present

## 2019-11-11 DIAGNOSIS — N2581 Secondary hyperparathyroidism of renal origin: Secondary | ICD-10-CM | POA: Diagnosis not present

## 2020-01-21 ENCOUNTER — Ambulatory Visit: Payer: Medicare PPO | Admitting: Cardiovascular Disease

## 2020-01-24 ENCOUNTER — Encounter: Payer: Self-pay | Admitting: Cardiovascular Disease

## 2020-02-06 NOTE — Progress Notes (Signed)
Cardiology Office Note    Date:  02/09/2020   ID:  Natasha Sawyer, DOB March 02, 1941, MRN [id number]  PCP:  Excell Seltzer, MD  Cardiologist:  Lorine Bears, MD  Electrophysiologist:  None   Chief Complaint: Follow up  History of Present Illness:   Natasha Sawyer is a 79 y.o. female with history of pSVT, CKD stage III followed by nephrology, HTN, and HLD who presents for follow up of SVT.   Natasha Sawyer was evaluated in 2013 for palpitations, atypical chest pain, and SOB with treadmill stress test being normal. Echo showed a normal LVSF with no significant valvular abnormalities. 48-hour Holter monitor showed sinus rhythm with frequent PACs and no significant arrhythmia. Natasha Sawyer was seen in 2019 with recurrent palpitations. Zio patch showed sinus rhythm with a 4-beat run of WCT, 18 episodes of SVT with the longest lasting 16 beats and occasional PACs and PVCs. Treadmill stress test in 08/2018 showed no evidence of ischemia. Natasha Sawyer was able to exercise for 3 minutes and 39 seconds. Echo in 09/2018, showed normal LVSF, mild mitral regurgitation, and normal PASP. In the setting of baseline bradycardic heart rates, Natasha Sawyer was not started on scheduled medications, though was prescribed prn propranolol. Natasha Sawyer was last seen in 07/2019, and was doing reasonably well, with 1 reported episode of tachypalpitations, lasting for approximately 15 minutes with spontaneous resolution. Natasha Sawyer noted increased fatigue with noted negative cardiac work up as above. Continued monitoring was advised regarding her SVT given infrequent symptoms.   Natasha Sawyer comes in today noting one episode of tachypalpitations since Natasha Sawyer was last seen.  Natasha Sawyer indicated Natasha Sawyer was unable to get to her as needed propranolol as when Natasha Sawyer has these episodes Natasha Sawyer becomes profoundly fatigued and must lay down right away.  With rest, her symptoms spontaneously resolved within 45 minutes.  This episode was not associated with chest pain, dyspnea, or dizziness, just fatigue.  Natasha Sawyer  otherwise denies any further tachypalpitations.  Her main complaint at this time is exertional fatigue that seems to be progressing.  Natasha Sawyer denies any exertional chest pain or dyspnea.  Natasha Sawyer reports just when Natasha Sawyer is walking around her house or in the store Natasha Sawyer becomes quite tired and needs to sit and rest.  Natasha Sawyer walks approximately 1 mile on a daily basis.  No lower extremity swelling, abdominal tension, orthopnea, PND, early satiety.  Natasha Sawyer reports adequate chronotropic response when checking her heart rates following her walking regimen.   Labs independently reviewed: 06/2019 - TC 164, TG 142, HDL 74, LDL 62, potassium 4.3, BUN 21, SCr 1.19, albumin 4.1, AST/ALT normal 1-/2019 - A1c 6.0 05/2016 - HGB 12.8, PLT 248, TSH normal  Past Medical History:  Diagnosis Date  . Chronic kidney disease   . Hyperlipidemia   . Hypertension   . Premature beats     Past Surgical History:  Procedure Laterality Date  . APPENDECTOMY    . CESAREAN SECTION    . EYE SURGERY     cataract and eyelid  . HERNIA REPAIR      Current Medications: Current Meds  Medication Sig  . Acetaminophen (TYLENOL ARTHRITIS PAIN PO) Take 650 mg by mouth daily.  Marland Kitchen amLODipine (NORVASC) 10 MG tablet Take 10 mg by mouth daily.  . enalapril (VASOTEC) 20 MG tablet Take 20 mg by mouth daily.  . Ergocalciferol (VITAMIN D2) 2000 UNITS TABS Take 1 tablet by mouth daily.  Marland Kitchen ezetimibe-simvastatin (VYTORIN) 10-20 MG tablet TAKE 1 TABLET AT BEDTIME  . zolpidem (AMBIEN) 5 MG  tablet Take 2.5 mg by mouth at bedtime as needed.     Allergies:   Patient has no known allergies.   Social History   Socioeconomic History  . Marital status: Widowed    Spouse name: Not on file  . Number of children: Not on file  . Years of education: Not on file  . Highest education level: Not on file  Occupational History  . Not on file  Tobacco Use  . Smoking status: Never Smoker  . Smokeless tobacco: Never Used  Substance and Sexual Activity  . Alcohol  use: No  . Drug use: No  . Sexual activity: Not Currently  Other Topics Concern  . Not on file  Social History Narrative   Lives at [facility]    HAs living will, full code   Social Determinants of Health   Financial Resource Strain: Low Risk   . Difficulty of Paying Living Expenses: Not hard at all  Food Insecurity: No Food Insecurity  . Worried About Charity fundraiser in the Last Year: Never true  . Ran Out of Food in the Last Year: Never true  Transportation Needs: No Transportation Needs  . Lack of Transportation (Medical): No  . Lack of Transportation (Non-Medical): No  Physical Activity: Inactive  . Days of Exercise per Week: 0 days  . Minutes of Exercise per Session: 0 min  Stress: No Stress Concern Present  . Feeling of Stress : Not at all  Social Connections:   . Frequency of Communication with Friends and Family:   . Frequency of Social Gatherings with Friends and Family:   . Attends Religious Services:   . Active Member of Clubs or Organizations:   . Attends Archivist Meetings:   Marland Kitchen Marital Status:      Family History:  The patient's family history includes Breast cancer (age of onset: 62) in her cousin; Diabetes in her mother; Stroke in her father.  ROS:   Review of Systems  Constitutional: Positive for malaise/fatigue. Negative for chills, diaphoresis, fever and weight loss.  HENT: Negative for congestion.   Eyes: Negative for discharge and redness.  Respiratory: Negative for cough, sputum production, shortness of breath and wheezing.   Cardiovascular: Positive for palpitations. Negative for chest pain, orthopnea, claudication, leg swelling and PND.  Gastrointestinal: Negative for abdominal pain, blood in stool, heartburn, melena, nausea and vomiting.  Musculoskeletal: Negative for falls and myalgias.  Skin: Negative for rash.  Neurological: Positive for weakness. Negative for dizziness, tingling, tremors, sensory change, speech change, focal  weakness and loss of consciousness.  Endo/Heme/Allergies: Does not bruise/bleed easily.  Psychiatric/Behavioral: Negative for substance abuse. The patient is not nervous/anxious.   All other systems reviewed and are negative.    EKGs/Labs/Other Studies Reviewed:    Studies reviewed were summarized above. The additional studies were reviewed today:  2D echo 09/2018: - Left ventricle: The cavity size was normal. Systolic function was  normal. The estimated ejection fraction was in the range of 60%  to 65%. Wall motion was normal; there were no regional wall  motion abnormalities. Doppler parameters are consistent with  abnormal left ventricular relaxation (grade 1 diastolic  dysfunction).  - Mitral valve: There was mild regurgitation.  - Left atrium: The atrium was normal in size.  - Right ventricle: Systolic function was normal.  - Pulmonary arteries: Systolic pressure was within the normal  range.  __________  ETT 08/2018:  Blood pressure demonstrated a normal response to exercise.  There  was no ST segment deviation noted during stress.  No T wave inversion was noted during stress.   Normal treadmill stress test with no evidence of ischemia. Below average exercise capacity with exercise duration of 3 minutes and 39 seconds achieving a maximum heart rate of 130 bpm which is 89% of maximal predicted heart rate.   Mild PACs noted in recovery. __________  Luci Bank 07/2018: Normal sinus rhythm with an average heart rate of 62 bpm. 4 beat run of wide-complex tachycardia at a rate of 105 bpm. 18 episodes of supraventricular tachycardia noted with the longest lasting 16 beats at a rate of 109 bpm.  Fastest tachycardia was 162 bpm but only lasted 6 beats. Occasional PACs with rare PVCs.   EKG:  EKG is ordered today.  The EKG ordered today demonstrates sinus bradycardia, 54 bpm, no acute ST-T changes  Recent Labs: 06/28/2019: ALT 10; BUN 21; Creatinine, Ser 1.19; Potassium  4.3; Sodium 141  Recent Lipid Panel    Component Value Date/Time   CHOL 164 06/28/2019 0928   TRIG 142.0 06/28/2019 0928   TRIG 68 12/12/2009 1413   HDL 74.20 06/28/2019 0928   CHOLHDL 2 06/28/2019 0928   VLDL 28.4 06/28/2019 0928   LDLCALC 62 06/28/2019 0928    PHYSICAL EXAM:    VS:  BP 116/76 (BP Location: Left Arm, Patient Position: Sitting, Cuff Size: Normal)   Pulse (!) 54   Ht 5' (1.524 m)   Wt 153 lb 8 oz (69.6 kg)   SpO2 97%   BMI 29.98 kg/m   BMI: Body mass index is 29.98 kg/m.  Physical Exam  Constitutional: Natasha Sawyer is oriented to person, place, and time. Natasha Sawyer appears well-developed and well-nourished.  HENT:  Head: Normocephalic and atraumatic.  Eyes: Right eye exhibits no discharge. Left eye exhibits no discharge.  Neck: No JVD present.  Cardiovascular: Regular rhythm, S1 normal, S2 normal and normal heart sounds. Bradycardia present. Exam reveals no distant heart sounds, no friction rub, no midsystolic click and no opening snap.  No murmur heard. Pulses:      Posterior tibial pulses are 2+ on the right side and 2+ on the left side.  Pulmonary/Chest: Effort normal and breath sounds normal. No respiratory distress. Natasha Sawyer has no decreased breath sounds. Natasha Sawyer has no wheezes. Natasha Sawyer has no rales. Natasha Sawyer exhibits no tenderness.  Abdominal: Soft. Natasha Sawyer exhibits no distension. There is no abdominal tenderness.  Musculoskeletal:        General: No edema.     Cervical back: Normal range of motion.  Neurological: Natasha Sawyer is alert and oriented to person, place, and time.  Skin: Skin is warm and dry. No cyanosis. Nails show no clubbing.  Psychiatric: Natasha Sawyer has a normal mood and affect. Her speech is normal and behavior is normal. Judgment and thought content normal.    Wt Readings from Last 3 Encounters:  02/09/20 153 lb 8 oz (69.6 kg)  07/23/19 157 lb (71.2 kg)  07/02/19 159 lb (72.1 kg)     ASSESSMENT & PLAN:   1. Paroxysmal SVT: Since Natasha Sawyer was last seen Natasha Sawyer has had 1 episode of  tachypalpitations.  Natasha Sawyer was unable to get to her as needed propranolol as Natasha Sawyer indicates when Natasha Sawyer has these episodes Natasha Sawyer feels profoundly fatigued and must lay down.  With resting for approximately 45 minutes symptoms spontaneously resolved.  Natasha Sawyer has not had any further symptoms since.  Unable to place her on scheduled rate limiting medication secondary to underlying sinus bradycardic heart rates at  baseline.  Overall, her burden of tachypalpitations is quite low occurring once to twice per year and are self-limiting.  Should her tachypalpitations become more frequent or longer lasting would recommend referral to EP.  Check labs as outlined below.  Continue as needed propranolol.  2. Sinus bradycardia: Natasha Sawyer demonstrated appropriate chronotropic response in the office today with ambulation with clinical staff.  3. HTN: Blood pressure well controlled.  Remains on enalapril and amlodipine.  4. HLD: LDL 62 from 06/2019, with normal LFT at that time.  Remains on Vytorin.  Check CMP and lipid panel.  5. Exertional fatigue: This is her main complaint at this time.  Schedule Lexiscan Myoview to evaluate for high risk ischemia.  If this is unrevealing consider echo.  Check CBC, CMP, TSH.  We will also check at the patient's request B12, folate, vitamin D, iron, and ferritin.  Disposition: F/u with Dr. Kirke Corin or an APP in 4 weeks.   Medication Adjustments/Labs and Tests Ordered: Current medicines are reviewed at length with the patient today.  Concerns regarding medicines are outlined above. Medication changes, Labs and Tests ordered today are summarized above and listed in the Patient Instructions accessible in Encounters.   Signed, Eula Listen, PA-C 02/09/2020 10:33 AM     [facility] [place] [address] [place], [place] [address] [phone]

## 2020-02-09 ENCOUNTER — Other Ambulatory Visit
Admission: RE | Admit: 2020-02-09 | Discharge: 2020-02-09 | Disposition: A | Discharge status: Home / Self Care | Payer: Medicare PPO | Attending: Physician Assistant | Admitting: Physician Assistant

## 2020-02-09 ENCOUNTER — Ambulatory Visit (INDEPENDENT_AMBULATORY_CARE_PROVIDER_SITE_OTHER): Payer: Medicare PPO | Admitting: Physician Assistant

## 2020-02-09 ENCOUNTER — Other Ambulatory Visit: Payer: Self-pay

## 2020-02-09 ENCOUNTER — Encounter: Payer: Self-pay | Admitting: Physician Assistant

## 2020-02-09 VITALS — BP 116/76 | HR 54 | Ht 60.0 in | Wt 153.5 lb

## 2020-02-09 DIAGNOSIS — R002 Palpitations: Secondary | ICD-10-CM | POA: Diagnosis not present

## 2020-02-09 DIAGNOSIS — E782 Mixed hyperlipidemia: Secondary | ICD-10-CM

## 2020-02-09 DIAGNOSIS — I471 Supraventricular tachycardia: Secondary | ICD-10-CM

## 2020-02-09 DIAGNOSIS — R001 Bradycardia, unspecified: Secondary | ICD-10-CM

## 2020-02-09 DIAGNOSIS — I10 Essential (primary) hypertension: Secondary | ICD-10-CM

## 2020-02-09 DIAGNOSIS — R5383 Other fatigue: Secondary | ICD-10-CM

## 2020-02-09 LAB — COMPREHENSIVE METABOLIC PANEL
ALT: 12 U/L (ref 0–44)
AST: 18 U/L (ref 15–41)
Albumin: 4.2 g/dL (ref 3.5–5.0)
Alkaline Phosphatase: 60 U/L (ref 38–126)
Anion gap: 11 (ref 5–15)
BUN: 25 mg/dL — ABNORMAL HIGH (ref 8–23)
CO2: 24 mmol/L (ref 22–32)
Calcium: 9.9 mg/dL (ref 8.9–10.3)
Chloride: 105 mmol/L (ref 98–111)
Creatinine, Ser: 1.32 mg/dL — ABNORMAL HIGH (ref 0.44–1.00)
GFR calc Af Amer: 45 mL/min — ABNORMAL LOW (ref >60)
GFR calc non Af Amer: 39 mL/min — ABNORMAL LOW (ref >60)
Glucose, Bld: 99 mg/dL (ref 70–99)
Potassium: 4.8 mmol/L (ref 3.5–5.1)
Sodium: 140 mmol/L (ref 135–145)
Total Bilirubin: 0.6 mg/dL (ref 0.3–1.2)
Total Protein: 7.4 g/dL (ref 6.5–8.1)

## 2020-02-09 LAB — FOLATE: Folate: 12.3 ng/mL (ref >5.9)

## 2020-02-09 LAB — CBC
HCT: 40.2 % (ref 36.0–46.0)
Hemoglobin: 13.1 g/dL (ref 12.0–15.0)
MCH: 30.7 pg (ref 26.0–34.0)
MCHC: 32.6 g/dL (ref 30.0–36.0)
MCV: 94.1 fL (ref 80.0–100.0)
Platelets: 247 10*3/uL (ref 150–400)
RBC: 4.27 MIL/uL (ref 3.87–5.11)
RDW: 14.5 % (ref 11.5–15.5)
WBC: 7.1 10*3/uL (ref 4.0–10.5)
nRBC: 0 % (ref 0.0–0.2)

## 2020-02-09 LAB — LIPID PANEL
Cholesterol: 170 mg/dL (ref 0–200)
HDL: 78 mg/dL (ref >40)
LDL Cholesterol: 64 mg/dL (ref 0–99)
Total CHOL/HDL Ratio: 2.2 RATIO
Triglycerides: 138 mg/dL (ref <150)
VLDL: 28 mg/dL (ref 0–40)

## 2020-02-09 LAB — IRON: Iron: 79 ug/dL (ref 28–170)

## 2020-02-09 LAB — TSH: TSH: 2.551 u[IU]/mL (ref 0.350–4.500)

## 2020-02-09 LAB — FERRITIN: Ferritin: 8 ng/mL — ABNORMAL LOW (ref 11–307)

## 2020-02-09 LAB — VITAMIN B12: Vitamin B-12: 109 pg/mL — ABNORMAL LOW (ref 180–914)

## 2020-02-09 NOTE — Patient Instructions (Signed)
Medication Instructions:  No Changes *If you need a refill on your cardiac medications before your next appointment, please call your pharmacy*   Lab Work: Your physician recommends that you have labs drawn today in the medical mall.  If you have labs (blood work) drawn today and your tests are completely normal, you will receive your results only by: Marland Kitchen MyChart Message (if you have MyChart) OR . A paper copy in the mail If you have any lab test that is abnormal or we need to change your treatment, we will call you to review the results.   Testing/Procedures: Your physician has requested that you have a lexiscan myoview.   Natasha Sawyer  Your caregiver has ordered a Stress Test with nuclear imaging. The purpose of this test is to evaluate the blood supply to your heart muscle. This procedure is referred to as a "Non-Invasive Stress Test." This is because other than having an IV started in your vein, nothing is inserted or "invades" your body. Cardiac stress tests are done to find areas of poor blood flow to the heart by determining the extent of coronary artery disease (CAD). Some patients exercise on a treadmill, which naturally increases the blood flow to your heart, while others who are  unable to walk on a treadmill due to physical limitations have a pharmacologic/chemical stress agent called Lexiscan . This medicine will mimic walking on a treadmill by temporarily increasing your coronary blood flow.   Please note: these test may take anywhere between 2-4 hours to complete  PLEASE REPORT TO [place] AT THE FIRST DESK WILL DIRECT YOU WHERE TO GO  Date of Procedure:_____________________________________  Arrival Time for Procedure:______________________________  Instructions regarding medication:   ____ : PLEASE NOTIFY THE OFFICE AT LEAST 24 HOURS IN ADVANCE IF YOU ARE UNABLE TO KEEP YOUR APPOINTMENT.  [phone] AND  PLEASE NOTIFY NUCLEAR  MEDICINE AT [facility] AT LEAST 24 HOURS IN ADVANCE IF YOU ARE UNABLE TO KEEP YOUR APPOINTMENT. [phone]  How to prepare for your Myoview test:  1. Do not eat or drink after midnight 2. No caffeine for 24 hours prior to test 3. No smoking 24 hours prior to test. 4. Your medication may be taken with water.  If your doctor stopped a medication because of this test, do not take that medication. 5. Ladies, please do not wear dresses.  Skirts or pants are appropriate. Please wear a short sleeve shirt. 6. No perfume, cologne or lotion. 7. Wear comfortable walking shoes. No heels!            Follow-Up: At [facility], you and your health needs are our priority.  As part of our continuing mission to provide you with exceptional heart care, we have created designated Provider Care Teams.  These Care Teams include your primary Cardiologist (physician) and Advanced Practice Providers (APPs -  Physician Assistants and Nurse Practitioners) who all work together to provide you with the care you need, when you need it.  We recommend signing up for the patient portal called "MyChart".  Sign up information is provided on this After Visit Summary.  MyChart is used to connect with patients for Virtual Visits (Telemedicine).  Patients are able to view lab/test results, encounter notes, upcoming appointments, etc.  Non-urgent messages can be sent to your provider as well.   To learn more about what you can do with MyChart, go to [email or web address].    Your next appointment:   4 week(s)  The format for your next appointment:   In Person  Provider:    You may see Lorine Bears, MD or one of the following Advanced Practice Providers on your designated Care Team:     Eula Listen, [place]     Other Instructions N/A

## 2020-02-10 ENCOUNTER — Telehealth: Payer: Self-pay

## 2020-02-10 NOTE — Telephone Encounter (Signed)
-----   Message from Sondra Barges, PA-C sent at 02/09/2020  6:32 PM EDT ----- Thyroid function normal. Cholesterol at goal. Potassium at goal. Random glucose normal. Renal function mildly elevated. Liver function normal. Blood count normal. Folate normal. Ferritin mildly low. B12 mildly low. Await vitamin D.  Recommendations: -Continue with planned Lexiscan -Follow-up with PCP regarding low B12 and ferritin, though given normal hemoglobin these are not likely contributing to her symptoms

## 2020-02-10 NOTE — Telephone Encounter (Signed)
Call to patient to review labs.    Pt verbalized understanding and has no further questions at this time.    Advised pt to call for any further questions or concerns.  No further orders.   

## 2020-02-15 ENCOUNTER — Other Ambulatory Visit: Payer: Self-pay

## 2020-02-15 ENCOUNTER — Ambulatory Visit (INDEPENDENT_AMBULATORY_CARE_PROVIDER_SITE_OTHER): Payer: Medicare PPO | Admitting: Family Medicine

## 2020-02-15 ENCOUNTER — Encounter: Payer: Self-pay | Admitting: Family Medicine

## 2020-02-15 VITALS — BP 110/70 | HR 54 | Ht 60.0 in | Wt 154.0 lb

## 2020-02-15 DIAGNOSIS — E538 Deficiency of other specified B group vitamins: Secondary | ICD-10-CM | POA: Insufficient documentation

## 2020-02-15 MED ORDER — CYANOCOBALAMIN 1000 MCG/ML IJ SOLN
INTRAMUSCULAR | 0 refills | Status: DC
Start: 2020-02-15 — End: 2020-10-13

## 2020-02-15 NOTE — Patient Instructions (Signed)
Start b12 injections.   

## 2020-02-15 NOTE — Progress Notes (Signed)
Chief Complaint  Patient presents with   Vitamin B12 deficiency    History of Present Illness: HPI    79 year old female patient presents for B12 deficiency. Recent labs showed B12 109 at cardiology.   She has been  fatigued for a while. She occ has tingling in right side.  Mild memory issues, no confusion.   She can receive B12 injections at [facility].   35 years ago.. stomach staple   No family history of pernicious anemia.  This visit occurred during the SARS-CoV-2 public health emergency.  Safety protocols were in place, including screening questions prior to the visit, additional usage of staff PPE, and extensive cleaning of exam room while observing appropriate contact time as indicated for disinfecting solutions.   COVID 19 screen:  No recent travel or known exposure to COVID19 The patient denies respiratory symptoms of COVID 19 at this time. The importance of social distancing was discussed today.     Review of Systems  Constitutional: Negative for chills and fever.  HENT: Negative for congestion and ear pain.   Eyes: Negative for pain and redness.  Respiratory: Negative for cough and shortness of breath.   Cardiovascular: Negative for chest pain, palpitations and leg swelling.  Gastrointestinal: Negative for abdominal pain, blood in stool, constipation, diarrhea, nausea and vomiting.  Genitourinary: Negative for dysuria.  Musculoskeletal: Negative for falls and myalgias.  Skin: Negative for rash.  Neurological: Positive for tingling. Negative for dizziness.  Psychiatric/Behavioral: Positive for memory loss. Negative for depression. The patient is not nervous/anxious.       Past Medical History:  Diagnosis Date   Chronic kidney disease    Hyperlipidemia    Hypertension    Premature beats     reports that she has never smoked. She has never used smokeless tobacco. She reports that she does not drink alcohol or use drugs.   Current Outpatient  Medications:    Acetaminophen (TYLENOL ARTHRITIS PAIN PO), Take 650 mg by mouth daily., Disp: , Rfl:    amLODipine (NORVASC) 10 MG tablet, Take 10 mg by mouth daily., Disp: , Rfl:    enalapril (VASOTEC) 20 MG tablet, Take 20 mg by mouth daily., Disp: , Rfl:    Ergocalciferol (VITAMIN D2) 2000 UNITS TABS, Take 1 tablet by mouth daily., Disp: , Rfl:    ezetimibe-simvastatin (VYTORIN) 10-20 MG tablet, TAKE 1 TABLET AT BEDTIME, Disp: 90 tablet, Rfl: 3   propranolol (INDERAL) 10 MG tablet, Take 1 tablet (10 mg total) by mouth daily as needed (palpitations)., Disp: 12 tablet, Rfl: 0   zolpidem (AMBIEN) 5 MG tablet, Take 2.5 mg by mouth at bedtime as needed. , Disp: , Rfl:    Observations/Objective: Blood pressure 110/70, pulse (!) 54, height 5' (1.524 m), weight 154 lb (69.9 kg), SpO2 98 %.  Physical Exam Constitutional:      General: She is not in acute distress.    Appearance: Normal appearance. She is well-developed. She is obese. She is not ill-appearing or toxic-appearing.  HENT:     Head: Normocephalic.     Right Ear: Hearing, tympanic membrane, ear canal and external ear normal. Tympanic membrane is not erythematous, retracted or bulging.     Left Ear: Hearing, tympanic membrane, ear canal and external ear normal. Tympanic membrane is not erythematous, retracted or bulging.     Nose: No mucosal edema or rhinorrhea.     Right Sinus: No maxillary sinus tenderness or frontal sinus tenderness.     Left Sinus:  No maxillary sinus tenderness or frontal sinus tenderness.     Mouth/Throat:     Pharynx: Uvula midline.  Eyes:     General: Lids are normal. Lids are everted, no foreign bodies appreciated.     Conjunctiva/sclera: Conjunctivae normal.     Pupils: Pupils are equal, round, and reactive to light.  Neck:     Thyroid: No thyroid mass or thyromegaly.     Vascular: No carotid bruit.     Trachea: Trachea normal.  Cardiovascular:     Rate and Rhythm: Normal rate and regular rhythm.      Pulses: Normal pulses.     Heart sounds: Normal heart sounds, S1 normal and S2 normal. No murmur. No friction rub. No gallop.   Pulmonary:     Effort: Pulmonary effort is normal. No tachypnea or respiratory distress.     Breath sounds: Normal breath sounds. No decreased breath sounds, wheezing, rhonchi or rales.  Abdominal:     General: Bowel sounds are normal.     Palpations: Abdomen is soft.     Tenderness: There is no abdominal tenderness.  Musculoskeletal:     Cervical back: Normal range of motion and neck supple.  Skin:    General: Skin is warm and dry.     Findings: No rash.  Neurological:     Mental Status: She is alert.  Psychiatric:        Mood and Affect: Mood is not anxious or depressed.        Speech: Speech normal.        Behavior: Behavior normal. Behavior is cooperative.        Thought Content: Thought content normal.        Judgment: Judgment normal.      Assessment and Plan   Vitamin B 12 deficiency Symptomatic low B12 levels. Likely due to po intake low as well as past gastric surgery.  Start on weekly, then every 2 weeks then monthly injections.  Re-eval in 4 months after at monthly dosing to make sure staying up. She may want to transition to oral if able.     Eliezer Lofts, MD

## 2020-02-15 NOTE — Assessment & Plan Note (Signed)
Symptomatic low B12 levels. Likely due to po intake low as well as past gastric surgery.  Start on weekly, then every 2 weeks then monthly injections.  Re-eval in 4 months after at monthly dosing to make sure staying up. She may want to transition to oral if able.

## 2020-02-19 LAB — VITAMIN D 1,25 DIHYDROXY
Vitamin D 1, 25 (OH)2 Total: 78 pg/mL — ABNORMAL HIGH
Vitamin D2 1, 25 (OH)2: <10 pg/mL
Vitamin D3 1, 25 (OH)2: 78 pg/mL

## 2020-02-21 ENCOUNTER — Telehealth: Payer: Self-pay

## 2020-02-21 ENCOUNTER — Other Ambulatory Visit: Payer: Self-pay

## 2020-02-21 ENCOUNTER — Encounter
Admission: RE | Admit: 2020-02-21 | Discharge: 2020-02-21 | Disposition: A | Discharge status: Home / Self Care | Payer: Medicare PPO | Source: Ambulatory Visit | Attending: Physician Assistant | Admitting: Physician Assistant

## 2020-02-21 DIAGNOSIS — R5383 Other fatigue: Secondary | ICD-10-CM | POA: Diagnosis not present

## 2020-02-21 DIAGNOSIS — R001 Bradycardia, unspecified: Secondary | ICD-10-CM | POA: Insufficient documentation

## 2020-02-21 LAB — NM MYOCAR MULTI W/SPECT W/WALL MOTION / EF
Estimated workload: 1 METS
Exercise duration (min): 0 min
Exercise duration (sec): 0 s
LV dias vol: 55 mL (ref 46–106)
LV sys vol: 14 mL
MPHR: 143 {beats}/min
Peak HR: 96 {beats}/min
Percent HR: 67 %
Rest HR: 57 {beats}/min
SDS: 4
SRS: 9
SSS: 12
TID: 0.84

## 2020-02-21 MED ORDER — TECHNETIUM TC 99M TETROFOSMIN IV KIT
31.1700 | PACK | Freq: Once | INTRAVENOUS | Status: AC | PRN
  Administered 2020-02-21: 31.17 via INTRAVENOUS

## 2020-02-21 MED ORDER — REGADENOSON 0.4 MG/5ML IV SOLN
0.4000 mg | Freq: Once | INTRAVENOUS | Status: AC
  Administered 2020-02-21: 0.4 mg via INTRAVENOUS

## 2020-02-21 MED ORDER — TECHNETIUM TC 99M TETROFOSMIN IV KIT
10.7600 | PACK | Freq: Once | INTRAVENOUS | Status: AC | PRN
  Administered 2020-02-21: 10.76 via INTRAVENOUS

## 2020-02-21 NOTE — Telephone Encounter (Signed)
Call to patient to review labs.    Pt verbalized understanding and has no further questions at this time.    Advised pt to call for any further questions or concerns.  No further orders.   

## 2020-02-21 NOTE — Telephone Encounter (Signed)
Attempted to call patient. LMTCB 02/21/2020 ° ° °

## 2020-02-21 NOTE — Telephone Encounter (Signed)
-----   Message from Sondra Barges, PA-C sent at 02/21/2020  7:11 AM EDT ----- Patient requested vitamin D level is mildly elevated.  Follow-up with PCP for further discussion of adjusting her supplement.

## 2020-02-22 ENCOUNTER — Ambulatory Visit (INDEPENDENT_AMBULATORY_CARE_PROVIDER_SITE_OTHER): Payer: Medicare PPO

## 2020-02-22 ENCOUNTER — Telehealth: Payer: Self-pay

## 2020-02-22 DIAGNOSIS — R0600 Dyspnea, unspecified: Secondary | ICD-10-CM

## 2020-02-22 NOTE — Telephone Encounter (Signed)
Call to patient to review stress test results.    Pt verbalized understanding and has no further questions at this time.    Advised pt to call for any further questions or concerns.  Order placed for echo.  

## 2020-02-22 NOTE — Telephone Encounter (Signed)
-----   Message from Sondra Barges, PA-C sent at 02/21/2020  1:55 PM EDT ----- Stress test was normal.  Please schedule echo to evaluate for any structural abnormalities.  Follow-up should be after her echo.

## 2020-02-22 NOTE — Telephone Encounter (Signed)
Attempted to call patient. LMTCB 02/22/2020   

## 2020-02-23 ENCOUNTER — Telehealth: Payer: Self-pay

## 2020-02-23 NOTE — Telephone Encounter (Signed)
-----   Message from Sondra Barges, PA-C sent at 02/23/2020  7:40 AM EDT ----- Echo showed normal pump function, normal wall motion, slightly stiffened heart, normal pressure in the right side of the heart, mildly leaky mitral valve, and a mildly dilated ascending aorta.   Recommendations: -Compared to prior echo, findings are stable outside of a mildly dilated ascending aorta which can be followed with periodic echo in 12 months to ensure stability -Optimal BP control advised -Reassuring study -Cardiac workup has been reassuring

## 2020-02-23 NOTE — Telephone Encounter (Signed)
Call to patient to review echo results.    Pt verbalized understanding and has no further questions at this time.    Advised pt to call for any further questions or concerns.  No further orders.   

## 2020-03-13 DIAGNOSIS — N1831 Chronic kidney disease, stage 3a: Secondary | ICD-10-CM | POA: Diagnosis not present

## 2020-03-14 NOTE — Progress Notes (Signed)
Cardiology Office Note    Date:  03/21/2020   ID:  Natasha Sawyer, DOB 16-Jul-1941, MRN [id number]  PCP:  Excell Seltzer, MD  Cardiologist:  Lorine Bears, MD  Electrophysiologist:  None   Chief Complaint: Follow up  History of Present Illness:   Natasha Sawyer is a 79 y.o. female with history of pSVT, CKD stage III followed by nephrology, HTN, and HLD who presents for follow up of fatigue.   She was evaluated in 2013 for palpitations, atypical chest pain, and SOB with treadmill stress test being normal. Echo showed a normal LVSF with no significant valvular abnormalities. 48-hour Holter monitor showed sinus rhythm with frequent PACs and no significant arrhythmia. She was seen in 2019 with recurrent palpitations. Zio patch showed sinus rhythm with a 4-beat run of WCT, 18 episodes of SVT with the longest lasting 16 beats and occasional PACs and PVCs. Treadmill stress test in 08/2018 showed no evidence of ischemia. She was able to exercise for 3 minutes and 39 seconds. Echo in 09/2018, showed normal LVSF, mild mitral regurgitation, and normal PASP. In the setting of baseline bradycardic heart rates, she was not started on scheduled medications, though was prescribed prn propranolol. She was seen in 07/2019, and was doing reasonably well, with 1 reported episode of tachypalpitations, lasting for approximately 15 minutes with spontaneous resolution. She noted increased fatigue with noted negative cardiac work up as above. Continued monitoring was advised regarding her SVT given infrequent symptoms.   She was last seen in the office on 02/09/2020 noting one episode of tachypalpitations since she was last seen with symptoms resolving within 45 minutes.  She also noted exertional fatigue that seemed to be progressing without noted angina or dyspnea.  Laboratory evaluation was unrevealing outside of a mildly low ferritin of 8 and B12 of 109.  Lexiscan Myoview on 02/21/2020 showed no significant ischemia with an  EF greater than 65% and was overall a low risk study.  Echo on 02/22/2020 showed an EF of 55 to 60%, no regional wall motion normalities, diastolic dysfunction, normal RV systolic function and normal RV ventricular cavity size, normal PASP, mild mitral regurgitation, and mildly dilated ascending aorta measuring 36 mm.  She comes in doing well from a cardiac perspective.  Since she was last seen she has been started on vitamin B12 injections and has noted a significant improvement in her underlying fatigue.  No chest pain, dyspnea, dizziness, presyncope, or syncope.  Since she was last seen she did have 1 very brief episode of tachypalpitations lasting for several minutes with spontaneous resolution.  She has not needed any as needed propanolol.  Heart rates at home remain low though stable in the low 50s bpm.  She does not have any issues or concerns at this time.   Labs independently reviewed: 01/2020 -TSH normal, Hgb 13.1, PLT 247, potassium 4.8, BUN 25, serum creatinine 1.32, BUN 4.2, AST/ALT normal, TC 170, TG 138, HDL 78, LDL 64, vitamin D 78  Past Medical History:  Diagnosis Date  . Chronic kidney disease   . Hyperlipidemia   . Hypertension   . Premature beats     Past Surgical History:  Procedure Laterality Date  . APPENDECTOMY    . CESAREAN SECTION    . EYE SURGERY     cataract and eyelid  . HERNIA REPAIR      Current Medications: Current Meds  Medication Sig  . Acetaminophen (TYLENOL ARTHRITIS PAIN PO) Take 650 mg by mouth daily.  Marland Kitchen  amLODipine (NORVASC) 10 MG tablet Take 10 mg by mouth daily.  . cyanocobalamin (,VITAMIN B-12,) 1000 MCG/ML injection 1000 mcg IM injection weekly x 4, then every 2 weeks x 2 , then monthly  . enalapril (VASOTEC) 20 MG tablet Take 20 mg by mouth daily.  . Ergocalciferol (VITAMIN D2) 2000 UNITS TABS Take 1 tablet by mouth daily.  Marland Kitchen ezetimibe-simvastatin (VYTORIN) 10-20 MG tablet TAKE 1 TABLET AT BEDTIME  . propranolol (INDERAL) 10 MG tablet Take 1  tablet (10 mg total) by mouth daily as needed (palpitations).  . zolpidem (AMBIEN) 5 MG tablet Take 2.5 mg by mouth at bedtime as needed.     Allergies:   Patient has no known allergies.   Social History   Socioeconomic History  . Marital status: Widowed    Spouse name: Not on file  . Number of children: Not on file  . Years of education: Not on file  . Highest education level: Not on file  Occupational History  . Not on file  Tobacco Use  . Smoking status: Never Smoker  . Smokeless tobacco: Never Used  Vaping Use  . Vaping Use: Never used  Substance and Sexual Activity  . Alcohol use: No  . Drug use: No  . Sexual activity: Not Currently  Other Topics Concern  . Not on file  Social History Narrative   Lives at [facility]    HAs living will, full code   Social Determinants of Health   Financial Resource Strain: Low Risk   . Difficulty of Paying Living Expenses: Not hard at all  Food Insecurity: No Food Insecurity  . Worried About Programme researcher, broadcasting/film/video in the Last Year: Never true  . Ran Out of Food in the Last Year: Never true  Transportation Needs: No Transportation Needs  . Lack of Transportation (Medical): No  . Lack of Transportation (Non-Medical): No  Physical Activity: Inactive  . Days of Exercise per Week: 0 days  . Minutes of Exercise per Session: 0 min  Stress: No Stress Concern Present  . Feeling of Stress : Not at all  Social Connections:   . Frequency of Communication with Friends and Family:   . Frequency of Social Gatherings with Friends and Family:   . Attends Religious Services:   . Active Member of Clubs or Organizations:   . Attends Banker Meetings:   Marland Kitchen Marital Status:      Family History:  The patient's family history includes Breast cancer (age of onset: 7) in her cousin; Diabetes in her mother; Stroke in her father.  ROS:   Review of Systems  Constitutional: Positive for malaise/fatigue. Negative for chills, diaphoresis,  fever and weight loss.       Improved fatigue  HENT: Negative for congestion.   Eyes: Negative for discharge and redness.  Respiratory: Negative for cough, sputum production, shortness of breath and wheezing.   Cardiovascular: Negative for chest pain, palpitations, orthopnea, claudication, leg swelling and PND.  Gastrointestinal: Negative for abdominal pain, heartburn, nausea and vomiting.  Musculoskeletal: Negative for falls and myalgias.  Skin: Negative for rash.  Neurological: Negative for dizziness, tingling, tremors, sensory change, speech change, focal weakness, loss of consciousness and weakness.  Endo/Heme/Allergies: Does not bruise/bleed easily.  Psychiatric/Behavioral: Negative for substance abuse. The patient is not nervous/anxious.   All other systems reviewed and are negative.    EKGs/Labs/Other Studies Reviewed:    Studies reviewed were summarized above. The additional studies were reviewed today:  Lexiscan MPI  02/21/2020:  The study is normal.  This is a low risk study.  The left ventricular ejection fraction is hyperdynamic (>65%).  There was no ST segment deviation noted during stress.  No T wave inversion was noted during stress. __________  2D echo 09/2018: - Left ventricle: The cavity size was normal. Systolic function was  normal. The estimated ejection fraction was in the range of 60%  to 65%. Wall motion was normal; there were no regional wall  motion abnormalities. Doppler parameters are consistent with  abnormal left ventricular relaxation (grade 1 diastolic  dysfunction).  - Mitral valve: There was mild regurgitation.  - Left atrium: The atrium was normal in size.  - Right ventricle: Systolic function was normal.  - Pulmonary arteries: Systolic pressure was within the normal  range.  __________  ETT 08/2018:  Blood pressure demonstrated a normal response to exercise.  There was no ST segment deviation noted during stress.  No T  wave inversion was noted during stress.  Normal treadmill stress test with no evidence of ischemia. Below average exercise capacity with exercise duration of 3 minutes and 39 seconds achieving a maximum heart rate of 130 bpm which is 89% of maximal predicted heart rate.  Mild PACs noted in recovery. __________  [facility] 07/2018: Normal sinus rhythm with an average heart rate of 62 bpm. 4 beat run of wide-complex tachycardia at a rate of 105 bpm. 18 episodes of supraventricular tachycardia noted with the longest lasting 16 beats at a rate of 109 bpm. Fastest tachycardia was 162 bpm but only lasted 6 beats. Occasional PACs with rare PVCs.  EKG:  EKG is ordered today.  The EKG ordered today demonstrates sinus bradycardia, 50 bpm, no acute ST-T changes  Recent Labs: 02/09/2020: ALT 12; BUN 25; Creatinine, Ser 1.32; Hemoglobin 13.1; Platelets 247; Potassium 4.8; Sodium 140; TSH 2.551  Recent Lipid Panel    Component Value Date/Time   CHOL 170 02/09/2020 1159   TRIG 138 02/09/2020 1159   TRIG 68 12/12/2009 1413   HDL 78 02/09/2020 1159   CHOLHDL 2.2 02/09/2020 1159   VLDL 28 02/09/2020 1159   LDLCALC 64 02/09/2020 1159    PHYSICAL EXAM:    VS:  BP 110/78 (BP Location: Left Arm, Patient Position: Sitting, Cuff Size: Normal)   Pulse (!) 50   Ht 5' (1.524 m)   Wt 154 lb (69.9 kg)   SpO2 98%   BMI 30.08 kg/m   BMI: Body mass index is 30.08 kg/m.  Physical Exam Constitutional:      Appearance: She is well-developed.  HENT:     Head: Normocephalic and atraumatic.  Eyes:     General:        Right eye: No discharge.        Left eye: No discharge.  Neck:     Vascular: No JVD.  Cardiovascular:     Rate and Rhythm: Regular rhythm. Bradycardia present.     Pulses: No midsystolic click and no opening snap.          Dorsalis pedis pulses are 2+ on the right side and 2+ on the left side.       Posterior tibial pulses are 2+ on the right side and 2+ on the left side.     Heart sounds:  Normal heart sounds, S1 normal and S2 normal. Heart sounds not distant. No murmur heard.  No friction rub.  Pulmonary:     Effort: Pulmonary effort is normal. No respiratory distress.  Breath sounds: Normal breath sounds. No decreased breath sounds, wheezing or rales.  Chest:     Chest wall: No tenderness.  Abdominal:     General: There is no distension.     Palpations: Abdomen is soft.     Tenderness: There is no abdominal tenderness.  Musculoskeletal:     Cervical back: Normal range of motion.  Skin:    General: Skin is warm and dry.     Nails: There is no clubbing.  Neurological:     Mental Status: She is alert and oriented to person, place, and time.  Psychiatric:        Speech: Speech normal.        Behavior: Behavior normal.        Thought Content: Thought content normal.        Judgment: Judgment normal.     Wt Readings from Last 3 Encounters:  03/21/20 154 lb (69.9 kg)  02/15/20 154 lb (69.9 kg)  02/09/20 153 lb 8 oz (69.6 kg)     ASSESSMENT & PLAN:   1. Paroxysmal SVT: Well controlled.  Historically, we have been unable to place her on standing rate control therapy secondary to underlying bradycardic heart rates at baseline.  Overall, her burden of tachypalpitations has been quite low occurring once to twice per year and have been self-limiting.  Should her tachypalpitations become more frequent or longer lasting would refer to EP for consideration of SVT ablation.  Recent labs unrevealing as outlined above.  Continue as needed propanolol.  2. Sinus bradycardia: She has demonstrated appropriate chronotropic response in the office.  Asymptomatic.  No indication for PPM.  3. HTN: Blood pressure well controlled.  Continue amlodipine.  4. HLD: LDL of 64 from 01/2020 with normal LFT.  Remains on Vytorin.  5. Exertional fatigue: Much improved following the initiation of vitamin B12 injections.  Echo and Lexiscan MPI unrevealing as outlined above.  Follow-up with PCP as  directed.  Disposition: F/u with Dr. Kirke CorinArida or an APP in 6 months.   Medication Adjustments/Labs and Tests Ordered: Current medicines are reviewed at length with the patient today.  Concerns regarding medicines are outlined above. Medication changes, Labs and Tests ordered today are summarized above and listed in the Patient Instructions accessible in Encounters.   Signed, Eula Listenyan Vee Bahe, PA-C 03/21/2020 9:19 AM     [facility] [place] [address] [place], [place] [address] [phone]

## 2020-03-16 DIAGNOSIS — R809 Proteinuria, unspecified: Secondary | ICD-10-CM | POA: Diagnosis not present

## 2020-03-16 DIAGNOSIS — N1831 Chronic kidney disease, stage 3a: Secondary | ICD-10-CM | POA: Diagnosis not present

## 2020-03-16 DIAGNOSIS — I10 Essential (primary) hypertension: Secondary | ICD-10-CM | POA: Diagnosis not present

## 2020-03-16 DIAGNOSIS — N2581 Secondary hyperparathyroidism of renal origin: Secondary | ICD-10-CM | POA: Diagnosis not present

## 2020-03-21 ENCOUNTER — Encounter: Payer: Self-pay | Admitting: Physician Assistant

## 2020-03-21 ENCOUNTER — Ambulatory Visit (INDEPENDENT_AMBULATORY_CARE_PROVIDER_SITE_OTHER): Payer: Medicare PPO | Admitting: Physician Assistant

## 2020-03-21 ENCOUNTER — Other Ambulatory Visit: Payer: Self-pay

## 2020-03-21 VITALS — BP 110/78 | HR 50 | Ht 60.0 in | Wt 154.0 lb

## 2020-03-21 DIAGNOSIS — I10 Essential (primary) hypertension: Secondary | ICD-10-CM | POA: Diagnosis not present

## 2020-03-21 DIAGNOSIS — R5383 Other fatigue: Secondary | ICD-10-CM

## 2020-03-21 DIAGNOSIS — E782 Mixed hyperlipidemia: Secondary | ICD-10-CM | POA: Diagnosis not present

## 2020-03-21 DIAGNOSIS — R002 Palpitations: Secondary | ICD-10-CM | POA: Diagnosis not present

## 2020-03-21 DIAGNOSIS — R001 Bradycardia, unspecified: Secondary | ICD-10-CM | POA: Diagnosis not present

## 2020-03-21 DIAGNOSIS — I471 Supraventricular tachycardia, unspecified: Secondary | ICD-10-CM

## 2020-03-21 NOTE — Patient Instructions (Signed)
Medication Instructions:  Your physician recommends that you continue on your current medications as directed. Please refer to the Current Medication list given to you today.  *If you need a refill on your cardiac medications before your next appointment, please call your pharmacy*   Lab Work: None ordered If you have labs (blood work) drawn today and your tests are completely normal, you will receive your results only by: Marland Kitchen MyChart Message (if you have MyChart) OR . A paper copy in the mail If you have any lab test that is abnormal or we need to change your treatment, we will call you to review the results.   Testing/Procedures: None ordered   Follow-Up: At [facility], you and your health needs are our priority.  As part of our continuing mission to provide you with exceptional heart care, we have created designated Provider Care Teams.  These Care Teams include your primary Cardiologist (physician) and Advanced Practice Providers (APPs -  Physician Assistants and Nurse Practitioners) who all work together to provide you with the care you need, when you need it.  We recommend signing up for the patient portal called "MyChart".  Sign up information is provided on this After Visit Summary.  MyChart is used to connect with patients for Virtual Visits (Telemedicine).  Patients are able to view lab/test results, encounter notes, upcoming appointments, etc.  Non-urgent messages can be sent to your provider as well.   To learn more about what you can do with MyChart, go to [email or web address].    Your next appointment:   6 month(s)  The format for your next appointment:   In Person  Provider:    You may see Lorine Bears, MD or one of the following Advanced Practice Providers on your designated Care Team:     Eula Listen, [place]     Other Instructions N/A

## 2020-06-20 ENCOUNTER — Telehealth: Payer: Self-pay | Admitting: Family Medicine

## 2020-06-20 NOTE — Telephone Encounter (Signed)
LVM for pt to RTN my call to R/S appt with [facility] on 06/28/20

## 2020-06-28 ENCOUNTER — Other Ambulatory Visit: Payer: Self-pay

## 2020-06-28 ENCOUNTER — Ambulatory Visit: Payer: Medicare PPO

## 2020-06-28 ENCOUNTER — Other Ambulatory Visit (INDEPENDENT_AMBULATORY_CARE_PROVIDER_SITE_OTHER): Payer: Medicare PPO

## 2020-06-28 DIAGNOSIS — R5383 Other fatigue: Secondary | ICD-10-CM | POA: Diagnosis not present

## 2020-06-28 DIAGNOSIS — E538 Deficiency of other specified B group vitamins: Secondary | ICD-10-CM

## 2020-06-28 DIAGNOSIS — R002 Palpitations: Secondary | ICD-10-CM

## 2020-06-28 LAB — CBC WITH DIFFERENTIAL/PLATELET
Basophils Absolute: 0 10*3/uL (ref 0.0–0.1)
Basophils Relative: 0.2 % (ref 0.0–3.0)
Eosinophils Absolute: 0.1 10*3/uL (ref 0.0–0.7)
Eosinophils Relative: 1.7 % (ref 0.0–5.0)
HCT: 37.8 % (ref 36.0–46.0)
Hemoglobin: 12.3 g/dL (ref 12.0–15.0)
Lymphocytes Relative: 30.4 % (ref 12.0–46.0)
Lymphs Abs: 1.9 10*3/uL (ref 0.7–4.0)
MCHC: 32.7 g/dL (ref 30.0–36.0)
MCV: 94 fl (ref 78.0–100.0)
Monocytes Absolute: 0.4 10*3/uL (ref 0.1–1.0)
Monocytes Relative: 6.9 % (ref 3.0–12.0)
Neutro Abs: 3.9 10*3/uL (ref 1.4–7.7)
Neutrophils Relative %: 60.8 % (ref 43.0–77.0)
Platelets: 253 10*3/uL (ref 150.0–400.0)
RBC: 4.02 Mil/uL (ref 3.87–5.11)
RDW: 14.1 % (ref 11.5–15.5)
WBC: 6.3 10*3/uL (ref 4.0–10.5)

## 2020-06-28 LAB — VITAMIN B12: Vitamin B-12: 793 pg/mL (ref 211–911)

## 2020-06-28 LAB — TSH: TSH: 2.47 u[IU]/mL (ref 0.35–4.50)

## 2020-06-28 LAB — T4, FREE: Free T4: 0.87 ng/dL (ref 0.60–1.60)

## 2020-06-29 NOTE — Progress Notes (Signed)
No critical labs need to be addressed urgently. We will discuss labs in detail at upcoming office visit.   

## 2020-07-04 ENCOUNTER — Other Ambulatory Visit: Payer: Self-pay

## 2020-07-04 ENCOUNTER — Encounter: Payer: Self-pay | Admitting: Family Medicine

## 2020-07-04 ENCOUNTER — Ambulatory Visit (INDEPENDENT_AMBULATORY_CARE_PROVIDER_SITE_OTHER): Payer: Medicare PPO | Admitting: Family Medicine

## 2020-07-04 DIAGNOSIS — R5383 Other fatigue: Secondary | ICD-10-CM | POA: Diagnosis not present

## 2020-07-04 DIAGNOSIS — N1831 Chronic kidney disease, stage 3a: Secondary | ICD-10-CM | POA: Diagnosis not present

## 2020-07-04 NOTE — Patient Instructions (Addendum)
Complete B12 injections, then transition back to oral B12 1000 mg daily.  Work on conditioning and lower body strength exercise.  Call if if worsening fatigue or new symptoms.

## 2020-07-04 NOTE — Progress Notes (Signed)
Chief Complaint  Patient presents with  . Fatigue    pt c/o fatigue and issues standing still and idle. She states that she starts to sweat excessively and needs to sit down or she fears passing out. She has noticed herself breathing out of her mouth more like she is catching her breath from small activities.     History of Present Illness: HPI   79 Year old female with history of HTN, CKD presents with fatigue x 6 months  She reports she is unable to stand still... she starts feel sweaty and weak.//.. onted this in last 3 months.  No dizziness.  When she is walking her regular mile walk.. she feels fine. If she stops and talks to someone.. she feels like she needs to sit, or walk more.   No chest pain with exertion. No palpitations.  She notes she is breathing through mouth.. because she is feeling more SOB.   Saw Dr. Kirke Corin in past for palpitations in 2013, 2014.. nml Holter, EKG, stress test, ECHO.   Last OV 03/2020 reviewed: ECHO 02/2020: EF 55-60% unremarkable   Mentioned no clear cardiac cause for fatigue. NO new meds   B12 now in nml range.  CBC , TSH normal.    This visit occurred during the SARS-CoV-2 public health emergency.  Safety protocols were in place, including screening questions prior to the visit, additional usage of staff PPE, and extensive cleaning of exam room while observing appropriate contact time as indicated for disinfecting solutions.   COVID 19 screen:  No recent travel or known exposure to COVID19 The patient denies respiratory symptoms of COVID 19 at this time. The importance of social distancing was discussed today.     Review of Systems  Constitutional: Positive for malaise/fatigue. Negative for chills and fever.  HENT: Negative for congestion and ear pain.   Eyes: Negative for pain and redness.  Respiratory: Negative for cough and shortness of breath.   Cardiovascular: Negative for chest pain, palpitations and leg swelling.  Gastrointestinal:  Negative for abdominal pain, blood in stool, constipation, diarrhea, nausea and vomiting.  Genitourinary: Negative for dysuria.  Musculoskeletal: Negative for falls and myalgias.  Skin: Negative for rash.  Neurological: Negative for dizziness.  Psychiatric/Behavioral: Negative for depression. The patient is not nervous/anxious.       Past Medical History:  Diagnosis Date  . Chronic kidney disease   . Hyperlipidemia   . Hypertension   . Premature beats     reports that she has never smoked. She has never used smokeless tobacco. She reports that she does not drink alcohol and does not use drugs.   Current Outpatient Medications:  .  Acetaminophen (TYLENOL ARTHRITIS PAIN PO), Take 650 mg by mouth daily., Disp: , Rfl:  .  amLODipine (NORVASC) 10 MG tablet, Take 10 mg by mouth daily., Disp: , Rfl:  .  cyanocobalamin (,VITAMIN B-12,) 1000 MCG/ML injection, 1000 mcg IM injection weekly x 4, then every 2 weeks x 2 , then monthly, Disp: 10 mL, Rfl: 0 .  enalapril (VASOTEC) 20 MG tablet, Take 20 mg by mouth daily., Disp: , Rfl:  .  Ergocalciferol (VITAMIN D2) 2000 UNITS TABS, Take 1 tablet by mouth daily., Disp: , Rfl:  .  ezetimibe-simvastatin (VYTORIN) 10-20 MG tablet, TAKE 1 TABLET AT BEDTIME, Disp: 90 tablet, Rfl: 3 .  propranolol (INDERAL) 10 MG tablet, Take 1 tablet (10 mg total) by mouth daily as needed (palpitations)., Disp: 12 tablet, Rfl: 0 .  zolpidem (AMBIEN)  5 MG tablet, Take 2.5 mg by mouth at bedtime as needed. , Disp: , Rfl:    Observations/Objective: Blood pressure 122/74, pulse 61, temperature (!) 94.8 F (34.9 C), height 5' (1.524 m), weight 155 lb 8 oz (70.5 kg), SpO2 98 %.  Physical Exam Constitutional:      General: She is not in acute distress.Vital signs are normal.     Appearance: Normal appearance. She is well-developed and well-nourished. She is not ill-appearing or toxic-appearing.  HENT:     Head: Normocephalic.     Right Ear: Hearing, tympanic membrane, ear  canal and external ear normal. Tympanic membrane is not erythematous, retracted or bulging.     Left Ear: Hearing, tympanic membrane, ear canal and external ear normal. Tympanic membrane is not erythematous, retracted or bulging.     Nose: No mucosal edema or rhinorrhea.     Right Sinus: No maxillary sinus tenderness or frontal sinus tenderness.     Left Sinus: No maxillary sinus tenderness or frontal sinus tenderness.     Mouth/Throat:     Mouth: Oropharynx is clear and moist and mucous membranes are normal.     Pharynx: Uvula midline.  Eyes:     General: Lids are normal. Lids are everted, no foreign bodies appreciated.     Extraocular Movements: EOM normal.     Conjunctiva/sclera: Conjunctivae normal.     Pupils: Pupils are equal, round, and reactive to light.  Neck:     Thyroid: No thyroid mass or thyromegaly.     Vascular: No carotid bruit.     Trachea: Trachea normal.  Cardiovascular:     Rate and Rhythm: Normal rate and regular rhythm.     Pulses: Normal pulses and intact distal pulses.     Heart sounds: Normal heart sounds, S1 normal and S2 normal. No murmur heard. No friction rub. No gallop.   Pulmonary:     Effort: Pulmonary effort is normal. No tachypnea or respiratory distress.     Breath sounds: Normal breath sounds. No decreased breath sounds, wheezing, rhonchi or rales.  Abdominal:     General: Bowel sounds are normal.     Palpations: Abdomen is soft.     Tenderness: There is no abdominal tenderness.  Musculoskeletal:     Cervical back: Normal range of motion and neck supple.  Skin:    General: Skin is warm, dry and intact.     Findings: No rash.  Neurological:     Mental Status: She is alert.  Psychiatric:        Mood and Affect: Mood is not anxious or depressed.        Speech: Speech normal.        Behavior: Behavior normal. Behavior is cooperative.        Thought Content: Thought content normal.        Cognition and Memory: Cognition and memory normal.         Judgment: Judgment normal.      Assessment and Plan    Problem List Items Addressed This Visit    Fatigue    Neg lab eval except low B12.  No clear cardio  Cause seen on ECHO  And eval per Dr. Kirke Corin.  Complete B12 injections, then transition back to oral B12 1000 mg daily.  Work on conditioning and lower body strength exercise.  Call if if worsening fatigue or new symptoms.           Kerby Nora, MD

## 2020-07-07 DIAGNOSIS — N2581 Secondary hyperparathyroidism of renal origin: Secondary | ICD-10-CM | POA: Diagnosis not present

## 2020-07-07 DIAGNOSIS — I10 Essential (primary) hypertension: Secondary | ICD-10-CM | POA: Diagnosis not present

## 2020-07-07 DIAGNOSIS — N1832 Chronic kidney disease, stage 3b: Secondary | ICD-10-CM | POA: Diagnosis not present

## 2020-07-07 DIAGNOSIS — R809 Proteinuria, unspecified: Secondary | ICD-10-CM | POA: Diagnosis not present

## 2020-07-12 ENCOUNTER — Other Ambulatory Visit: Payer: Self-pay | Admitting: Family Medicine

## 2020-07-12 ENCOUNTER — Other Ambulatory Visit: Payer: Self-pay

## 2020-07-12 ENCOUNTER — Ambulatory Visit (INDEPENDENT_AMBULATORY_CARE_PROVIDER_SITE_OTHER): Payer: Medicare PPO

## 2020-07-12 DIAGNOSIS — Z0000 Encounter for general adult medical examination without abnormal findings: Secondary | ICD-10-CM | POA: Diagnosis not present

## 2020-07-12 DIAGNOSIS — Z1231 Encounter for screening mammogram for malignant neoplasm of breast: Secondary | ICD-10-CM

## 2020-07-12 NOTE — Progress Notes (Signed)
Subjective:   Natasha Sawyer is a 79 y.o. female who presents for Medicare Annual (Subsequent) preventive examination.  Review of Systems: N/A      I connected with the patient today by telephone and verified that I am speaking with the correct person using two identifiers. Location patient: home Location nurse: work Persons participating in the telephone visit: patient, nurse.   I discussed the limitations, risks, security and privacy concerns of performing an evaluation and management service by telephone and the availability of in person appointments. I also discussed with the patient that there may be a patient responsible charge related to this service. The patient expressed understanding and verbally consented to this telephonic visit.        Cardiac Risk Factors include: advanced age (>21men, >49 women);hypertension;Other (see comment), Risk factor comments: hyperlipidemia     Objective:    Today's Vitals   There is no height or weight on file to calculate BMI.  Advanced Directives 07/12/2020 06/28/2019 06/25/2018 06/16/2017 06/12/2016  Does Patient Have a Medical Advance Directive? Yes Yes Yes Yes Yes  Type of Estate agent of [place];Living will Healthcare Power of [place];Living will Healthcare Power of [place];Living will Living will;Healthcare Power of [employer] Power of [place];Living will  Does patient want to make changes to medical advance directive? - - - - No - Patient declined  Copy of Healthcare Power of Attorney in Chart? Yes - validated most recent copy scanned in chart (See row information) Yes - validated most recent copy scanned in chart (See row information) No - copy requested No - copy requested No - copy requested    Current Medications (verified) Outpatient Encounter Medications as of 07/12/2020  Medication Sig  . Acetaminophen (TYLENOL ARTHRITIS PAIN PO) Take 650 mg by mouth daily.  Marland Kitchen amLODipine (NORVASC) 10 MG  tablet Take 10 mg by mouth daily.  . cyanocobalamin (,VITAMIN B-12,) 1000 MCG/ML injection 1000 mcg IM injection weekly x 4, then every 2 weeks x 2 , then monthly  . enalapril (VASOTEC) 20 MG tablet Take 20 mg by mouth daily.  . Ergocalciferol (VITAMIN D2) 2000 UNITS TABS Take 1 tablet by mouth daily.  Marland Kitchen ezetimibe-simvastatin (VYTORIN) 10-20 MG tablet TAKE 1 TABLET AT BEDTIME  . propranolol (INDERAL) 10 MG tablet Take 1 tablet (10 mg total) by mouth daily as needed (palpitations).  . zolpidem (AMBIEN) 5 MG tablet Take 2.5 mg by mouth at bedtime as needed.    No facility-administered encounter medications on file as of 07/12/2020.    Allergies (verified) Patient has no known allergies.   History: Past Medical History:  Diagnosis Date  . Chronic kidney disease   . Hyperlipidemia   . Hypertension   . Premature beats    Past Surgical History:  Procedure Laterality Date  . APPENDECTOMY    . CESAREAN SECTION    . EYE SURGERY     cataract and eyelid  . HERNIA REPAIR     Family History  Problem Relation Age of Onset  . Diabetes Mother   . Stroke Father   . Breast cancer Cousin 20       maternal   Social History   Socioeconomic History  . Marital status: Widowed    Spouse name: Not on file  . Number of children: Not on file  . Years of education: Not on file  . Highest education level: Not on file  Occupational History  . Not on file  Tobacco Use  . Smoking  status: Never Smoker  . Smokeless tobacco: Never Used  Vaping Use  . Vaping Use: Never used  Substance and Sexual Activity  . Alcohol use: No  . Drug use: No  . Sexual activity: Not Currently  Other Topics Concern  . Not on file  Social History Narrative   Lives [facility]    HAs living will, full code   Social Determinants of Health   Financial Resource Strain: Low Risk   . Difficulty of Paying Living Expenses: Not hard at all  Food Insecurity: No Food Insecurity  . Worried About Programme researcher, broadcasting/film/video in  the Last Year: Never true  . Ran Out of Food in the Last Year: Never true  Transportation Needs: No Transportation Needs  . Lack of Transportation (Medical): No  . Lack of Transportation (Non-Medical): No  Physical Activity: Insufficiently Active  . Days of Exercise per Week: 3 days  . Minutes of Exercise per Session: 30 min  Stress: No Stress Concern Present  . Feeling of Stress : Not at all  Social Connections:   . Frequency of Communication with Friends and Family: Not on file  . Frequency of Social Gatherings with Friends and Family: Not on file  . Attends Religious Services: Not on file  . Active Member of Clubs or Organizations: Not on file  . Attends Banker Meetings: Not on file  . Marital Status: Not on file    Tobacco Counseling Counseling given: Not Answered   Clinical Intake:  Pre-visit preparation completed: Yes  Pain : No/denies pain     Nutritional Risks: None Diabetes: No  How often do you need to have someone help you when you read instructions, pamphlets, or other written materials from your doctor or pharmacy?: 1 - Never What is the last grade level you completed in school?: college  Diabetic: No Nutrition Risk Assessment:  Has the patient had any N/V/D within the last 2 months?  No  Does the patient have any non-healing wounds?  No  Has the patient had any unintentional weight loss or weight gain?  No   Diabetes:  Is the patient diabetic?  No  If diabetic, was a CBG obtained today?  N/A Did the patient bring in their glucometer from home?  N/A How often do you monitor your CBG's? N/A.   Financial Strains and Diabetes Management:  Are you having any financial strains with the device, your supplies or your medication? N/A.  Does the patient want to be seen by Chronic Care Management for management of their diabetes?  N/A Would the patient like to be referred to a Nutritionist or for Diabetic Management?  N/A     Interpreter  Needed?: No  Information entered by :: CJohnson, LPN   Activities of Daily Living In your present state of health, do you have any difficulty performing the following activities: 07/12/2020  Hearing? N  Vision? N  Difficulty concentrating or making decisions? N  Walking or climbing stairs? N  Dressing or bathing? N  Doing errands, shopping? N  Preparing Food and eating ? N  Using the Toilet? N  In the past six months, have you accidently leaked urine? N  Do you have problems with loss of bowel control? N  Managing your Medications? N  Managing your Finances? N  Housekeeping or managing your Housekeeping? N  Some recent data might be hidden    Patient Care Team: Excell Seltzer, MD as PCP - General Lorine Bears  A, MD as PCP - Cardiology (Cardiology) Mady Haagensen, MD as Consulting Physician (Internal Medicine)  Indicate any recent Medical Services you may have received from other than [facility] providers in the past year (date may be approximate).     Assessment:   This is a routine wellness examination for Jannah.  Hearing/Vision screen  Hearing Screening   125Hz  250Hz  500Hz  1000Hz  2000Hz  3000Hz  4000Hz  6000Hz  8000Hz   Right ear:           Left ear:           Vision Screening Comments: Advised patient to get annual eye exams   Dietary issues and exercise activities discussed: Current Exercise Habits: Home exercise routine, Type of exercise: walking, Time (Minutes): 30, Frequency (Times/Week): 3, Weekly Exercise (Minutes/Week): 90, Intensity: Moderate, Exercise limited by: None identified  Goals    . Patient Stated     06/28/2019, Patient wants to work on losing some weight soon.     . Patient Stated     07/12/2020, I will continue to walk daily when weather permits.     . physical     Starting 06/25/2018, I will continue to walk 1-2 miles 5 days per week.       Depression Screen PHQ 2/9 Scores 07/12/2020 07/04/2020 06/28/2019 06/25/2018 06/16/2017 06/12/2016  PHQ -  2 Score 0 0 0 0 0 0  PHQ- 9 Score 0 2 0 0 0 -    Fall Risk Fall Risk  07/12/2020 07/04/2020 06/28/2019 06/25/2018 06/16/2017  Falls in the past year? 0 0 0 No Yes  Comment - - - - pt lost balance and fell out of bathroom door; injury to head. no LOC or medical treatment  Number falls in past yr: 0 0 - - 1  Injury with Fall? 0 0 - - Yes  Risk for fall due to : No Fall Risks - Medication side effect - -  Follow up Falls evaluation completed;Falls prevention discussed Falls evaluation completed Falls evaluation completed;Falls prevention discussed - -    Any stairs in or around the home? Yes  If so, are there any without handrails? No  Home free of loose throw rugs in walkways, pet beds, electrical cords, etc? Yes  Adequate lighting in your home to reduce risk of falls? Yes   ASSISTIVE DEVICES UTILIZED TO PREVENT FALLS:  Life alert? No  Use of a cane, walker or w/c? No  Grab bars in the bathroom? No  Shower chair or bench in shower? No  Elevated toilet seat or a handicapped toilet? No   TIMED UP AND GO:  Was the test performed? N/A, telephonic visit .   Cognitive Function: MMSE - Mini Mental State Exam 07/12/2020 06/28/2019 06/25/2018 06/16/2017 06/12/2016  Orientation to time 5 5 5 5 5   Orientation to Place 5 5 5 5 5   Registration 3 3 3 3 3   Attention/ Calculation 5 5 0 0 0  Recall 3 3 3 3 3   Language- name 2 objects - - 0 0 0  Language- repeat 1 1 1 1 1   Language- follow 3 step command - - 3 3 3   Language- read & follow direction - - 0 0 0  Write a sentence - - 0 0 0  Copy design - - 0 0 0  Total score - - 20 20 20   Mini Cog  Mini-Cog screen was completed. Maximum score is 22. A value of 0 denotes this part of the MMSE was not completed or the  patient failed this part of the Mini-Cog screening.       Immunizations Immunization History  Administered Date(s) Administered  . Fluad Quad(high Dose 65+) 06/14/2020  . Influenza,inj,Quad PF,6+ Mos 06/25/2018, 07/02/2019  .  Influenza-Unspecified 06/16/2013  . Moderna SARS-COVID-2 Vaccination 10/01/2019, 10/29/2019  . Pneumococcal Conjugate-13 06/25/2018  . Pneumococcal Polysaccharide-23 07/02/2019  . Zoster 02/13/2012  . Zoster Recombinat (Shingrix) 11/26/2019, 01/26/2020    TDAP status: Up to date Flu Vaccine status: Up to date Pneumococcal vaccine status: Up to date Covid-19 vaccine status: Completed vaccines  Qualifies for Shingles Vaccine? Yes   Zostavax completed Yes   Shingrix Completed?: Yes  Screening Tests Health Maintenance  Topic Date Due  . Hepatitis C Screening  Never done  . MAMMOGRAM  03/29/2020  . TETANUS/TDAP  02/11/2021  . INFLUENZA VACCINE  Completed  . DEXA SCAN  Completed  . COVID-19 Vaccine  Completed  . PNA vac Low Risk Adult  Completed    Health Maintenance  Health Maintenance Due  Topic Date Due  . Hepatitis C Screening  Never done  . MAMMOGRAM  03/29/2020    Colorectal cancer screening: No longer required.  Mammogram status:due, Patient will call and schedule appointment Bone Density status: due, Patient will call and schedule appointment  Lung Cancer Screening: (Low Dose CT Chest recommended if Age 63-80 years, 30 pack-year currently smoking OR have quit w/in 15years.) does not qualify.    Additional Screening:  Hepatitis C Screening: does qualify; Completed due  Vision Screening: Recommended annual ophthalmology exams for early detection of glaucoma and other disorders of the eye. Is the patient up to date with their annual eye exam?  No , Patient will call and schedule appointment Who is the provider or what is the name of the office in which the patient attends annual eye exams? [facility] If pt is not established with a provider, would they like to be referred to a provider to establish care? No .   Dental Screening: Recommended annual dental exams for proper oral hygiene  Community Resource Referral / Chronic Care Management: CRR required  this visit?  No   CCM required this visit?  No      Plan:     I have personally reviewed and noted the following in the patient's chart:   . Medical and social history . Use of alcohol, tobacco or illicit drugs  . Current medications and supplements . Functional ability and status . Nutritional status . Physical activity . Advanced directives . List of other physicians . Hospitalizations, surgeries, and ER visits in previous 12 months . Vitals . Screenings to include cognitive, depression, and falls . Referrals and appointments  In addition, I have reviewed and discussed with patient certain preventive protocols, quality metrics, and best practice recommendations. A written personalized care plan for preventive services as well as general preventive health recommendations were provided to patient.   Due to this being a telephonic visit, the after visit summary with patients personalized plan was offered to patient via office or my-chart. Patient preferred to pick up at office at next visit or via mychart.   Janalyn ShyJohnson, Jereme Loren, LPN   [id number]/27/2021

## 2020-07-12 NOTE — Progress Notes (Signed)
PCP notes:  Health Maintenance: Mammogram- due  Dexa- due    Abnormal Screenings: none   Patient concerns: none   Nurse concerns: none   Next PCP appt.: none 

## 2020-07-12 NOTE — Patient Instructions (Signed)
[name] , Thank you for taking time to come for your Medicare Wellness Visit. I appreciate your ongoing commitment to your health goals. Please review the following plan we discussed and let me know if I can assist you in the future.   Screening recommendations/referrals: Colonoscopy: no longer required  Mammogram: due, Call and schedule appointment  Bone Density: due, Call and schedule appointment Recommended yearly ophthalmology/optometry visit for glaucoma screening and checkup Recommended yearly dental visit for hygiene and checkup  Vaccinations: Influenza vaccine: Up to date, completed 06/14/2020, due 04/2021 Pneumococcal vaccine: Completed series Tdap vaccine: Up to date, completed 02/12/2011, due 01/2021 Shingles vaccine: due, check with your insurance company regarding coverage if interested    Covid-19:Completed series  Advanced directives: copy in chart  Conditions/risks identified: hypertension, hyperlipidemia  Next appointment: Follow up in one year for your annual wellness visit    Preventive Care 65 Years and Older, Female Preventive care refers to lifestyle choices and visits with your health care provider that can promote health and wellness. What does preventive care include?  A yearly physical exam. This is also called an annual well check.  Dental exams once or twice a year.  Routine eye exams. Ask your health care provider how often you should have your eyes checked.  Personal lifestyle choices, including:  Daily care of your teeth and gums.  Regular physical activity.  Eating a healthy diet.  Avoiding tobacco and drug use.  Limiting alcohol use.  Practicing safe sex.  Taking low-dose aspirin every day.  Taking vitamin and mineral supplements as recommended by your health care provider. What happens during an annual well check? The services and screenings done by your health care provider during your annual well check will depend on your age,  overall health, lifestyle risk factors, and family history of disease. Counseling  Your health care provider may ask you questions about your:  Alcohol use.  Tobacco use.  Drug use.  Emotional well-being.  Home and relationship well-being.  Sexual activity.  Eating habits.  History of falls.  Memory and ability to understand (cognition).  Work and work Astronomer.  Reproductive health. Screening  You may have the following tests or measurements:  Height, weight, and BMI.  Blood pressure.  Lipid and cholesterol levels. These may be checked every 5 years, or more frequently if you are over 46 years old.  Skin check.  Lung cancer screening. You may have this screening every year starting at age 25 if you have a 30-pack-year history of smoking and currently smoke or have quit within the past 15 years.  Fecal occult blood test (FOBT) of the stool. You may have this test every year starting at age 32.  Flexible sigmoidoscopy or colonoscopy. You may have a sigmoidoscopy every 5 years or a colonoscopy every 10 years starting at age 53.  Hepatitis C blood test.  Hepatitis B blood test.  Sexually transmitted disease (STD) testing.  Diabetes screening. This is done by checking your blood sugar (glucose) after you have not eaten for a while (fasting). You may have this done every 1-3 years.  Bone density scan. This is done to screen for osteoporosis. You may have this done starting at age 64.  Mammogram. This may be done every 1-2 years. Talk to your health care provider about how often you should have regular mammograms. Talk with your health care provider about your test results, treatment options, and if necessary, the need for more tests. Vaccines  Your health care  provider may recommend certain vaccines, such as:  Influenza vaccine. This is recommended every year.  Tetanus, diphtheria, and acellular pertussis (Tdap, Td) vaccine. You may need a Td booster every 10  years.  Zoster vaccine. You may need this after age 10.  Pneumococcal 13-valent conjugate (PCV13) vaccine. One dose is recommended after age 20.  Pneumococcal polysaccharide (PPSV23) vaccine. One dose is recommended after age 29. Talk to your health care provider about which screenings and vaccines you need and how often you need them. This information is not intended to replace advice given to you by your health care provider. Make sure you discuss any questions you have with your health care provider. Document Released: 09/29/2015 Document Revised: 05/22/2016 Document Reviewed: 07/04/2015 Elsevier Interactive Patient Education  2017 [place] Prevention in the Home Falls can cause injuries. They can happen to people of all ages. There are many things you can do to make your home safe and to help prevent falls. What can I do on the outside of my home?  Regularly fix the edges of walkways and driveways and fix any cracks.  Remove anything that might make you trip as you walk through a door, such as a raised step or threshold.  Trim any bushes or trees on the path to your home.  Use bright outdoor lighting.  Clear any walking paths of anything that might make someone trip, such as rocks or tools.  Regularly check to see if handrails are loose or broken. Make sure that both sides of any steps have handrails.  Any raised decks and porches should have guardrails on the edges.  Have any leaves, snow, or ice cleared regularly.  Use sand or salt on walking paths during winter.  Clean up any spills in your garage right away. This includes oil or grease spills. What can I do in the bathroom?  Use night lights.  Install grab bars by the toilet and in the tub and shower. Do not use towel bars as grab bars.  Use non-skid mats or decals in the tub or shower.  If you need to sit down in the shower, use a plastic, non-slip stool.  Keep the floor dry. Clean up any water that  spills on the floor as soon as it happens.  Remove soap buildup in the tub or shower regularly.  Attach bath mats securely with double-sided non-slip rug tape.  Do not have throw rugs and other things on the floor that can make you trip. What can I do in the bedroom?  Use night lights.  Make sure that you have a light by your bed that is easy to reach.  Do not use any sheets or blankets that are too big for your bed. They should not hang down onto the floor.  Have a firm chair that has side arms. You can use this for support while you get dressed.  Do not have throw rugs and other things on the floor that can make you trip. What can I do in the kitchen?  Clean up any spills right away.  Avoid walking on wet floors.  Keep items that you use a lot in easy-to-reach places.  If you need to reach something above you, use a strong step stool that has a grab bar.  Keep electrical cords out of the way.  Do not use floor polish or wax that makes floors slippery. If you must use wax, use non-skid floor wax.  Do not have throw  rugs and other things on the floor that can make you trip. What can I do with my stairs?  Do not leave any items on the stairs.  Make sure that there are handrails on both sides of the stairs and use them. Fix handrails that are broken or loose. Make sure that handrails are as long as the stairways.  Check any carpeting to make sure that it is firmly attached to the stairs. Fix any carpet that is loose or worn.  Avoid having throw rugs at the top or bottom of the stairs. If you do have throw rugs, attach them to the floor with carpet tape.  Make sure that you have a light switch at the top of the stairs and the bottom of the stairs. If you do not have them, ask someone to add them for you. What else can I do to help prevent falls?  Wear shoes that:  Do not have high heels.  Have rubber bottoms.  Are comfortable and fit you well.  Are closed at the  toe. Do not wear sandals.  If you use a stepladder:  Make sure that it is fully opened. Do not climb a closed stepladder.  Make sure that both sides of the stepladder are locked into place.  Ask someone to hold it for you, if possible.  Clearly mark and make sure that you can see:  Any grab bars or handrails.  First and last steps.  Where the edge of each step is.  Use tools that help you move around (mobility aids) if they are needed. These include:  Canes.  Walkers.  Scooters.  Crutches.  Turn on the lights when you go into a dark area. Replace any light bulbs as soon as they burn out.  Set up your furniture so you have a clear path. Avoid moving your furniture around.  If any of your floors are uneven, fix them.  If there are any pets around you, be aware of where they are.  Review your medicines with your doctor. Some medicines can make you feel dizzy. This can increase your chance of falling. Ask your doctor what other things that you can do to help prevent falls. This information is not intended to replace advice given to you by your health care provider. Make sure you discuss any questions you have with your health care provider. Document Released: 06/29/2009 Document Revised: 02/08/2016 Document Reviewed: 10/07/2014 Elsevier Interactive Patient Education  2017 [employer].

## 2020-08-02 ENCOUNTER — Ambulatory Visit: Payer: Medicare PPO

## 2020-08-16 ENCOUNTER — Other Ambulatory Visit: Payer: Self-pay | Admitting: Family Medicine

## 2020-08-29 ENCOUNTER — Ambulatory Visit
Admission: RE | Admit: 2020-08-29 | Discharge: 2020-08-29 | Disposition: A | Discharge status: Home / Self Care | Payer: Medicare PPO | Source: Ambulatory Visit | Attending: Family Medicine | Admitting: Family Medicine

## 2020-08-29 ENCOUNTER — Other Ambulatory Visit: Payer: Self-pay

## 2020-08-29 DIAGNOSIS — Z1231 Encounter for screening mammogram for malignant neoplasm of breast: Secondary | ICD-10-CM | POA: Diagnosis not present

## 2020-09-01 NOTE — Progress Notes (Signed)
No critical labs need to be addressed urgently. We will discuss labs in detail at upcoming office visit.   

## 2020-09-11 NOTE — Assessment & Plan Note (Signed)
Neg lab eval except low B12.  No clear cardio  Cause seen on ECHO  And eval per Dr. Kirke Corin.  Complete B12 injections, then transition back to oral B12 1000 mg daily.  Work on conditioning and lower body strength exercise.  Call if if worsening fatigue or new symptoms.

## 2020-10-05 NOTE — Progress Notes (Signed)
Cardiology Office Note    Date:  10/13/2020   ID:  Natasha Sawyer, DOB 1941/03/06, MRN [id number]  PCP:  Excell Seltzer, MD  Cardiologist:  Lorine Bears, MD  Electrophysiologist:  None   Chief Complaint: Follow up  History of Present Illness:   Natasha Sawyer is a 80 y.o. female with history of pSVT, CKD stage III followed by nephrology, HTN, and HLD who presents for follow up of paroxysmal SVT.   She was evaluated in 2013 for palpitations, atypical chest pain, and SOB with treadmill stress test being normal. Echo showed a normal LVSF with no significant valvular abnormalities. 48-hour Holter monitor showed sinus rhythm with frequent PACs and no significant arrhythmia. She was seen in 2019 with recurrent palpitations. Zio patch showed sinus rhythm with a 4-beat run of WCT, 18 episodes of SVT with the longest lasting 16 beats, and occasional PACs and PVCs. Treadmill stress test in 08/2018 showed no evidence of ischemia. She was able to exercise for 3 minutes and 39 seconds. Echo in 09/2018, showed normal LVSF, mild mitral regurgitation, and normal PASP. In the setting of baseline bradycardic heart rates, she was not started on scheduled medications, and was prescribed prn propranolol. She was seen in 07/2019, and was doing reasonably well, with one reported episode of tachypalpitations, lasting for approximately 15 minutes with spontaneous resolution. She noted increased fatigue with noted negative cardiac work up as above. Continued monitoring was advised regarding her SVT given infrequent symptoms.  She was seen in the office on 02/09/2020 noting one episode of tachypalpitations since she was last seen with symptoms resolving within 45 minutes.  She also noted exertional fatigue that seemed to be progressing without angina or dyspnea.  Laboratory evaluation was unrevealing outside of a mildly low ferritin of 8 and B12 of 109.  Lexiscan Myoview on 02/21/2020 showed no significant ischemia with an EF  greater than 65% and was overall a low risk study.  Echo on 02/22/2020 showed an EF of 55 to 60%, no regional wall motion abnormalities, diastolic dysfunction, normal RV systolic function and normal RV ventricular cavity size, normal PASP, mild mitral regurgitation, and mildly dilated ascending aorta measuring 36 mm.  She was last seen in the office in 03/2020 and was doing well from a cardiac perspective.  She had been started on B12 injections and reported significant improvement in her fatigue.  She reported one episode of palpitations that was brief and spontaneously resolved.   She comes in doing well from a cardiac perspective.  No chest pain, dyspnea, palpitations, dizziness, presyncope, syncope, or falls.  She has not needed any as needed propanolol since she was last seen.  Her only complaint at this time is some left shoulder discomfort which is worse with rotational movement.  She indicates she has previously had surgery on her right shoulder secondary to bone spurs.  She does not feel like her current discomfort is quite to that point yet.  Fatigue is stable.  She has follow-up with her PCP and nephrology next month.  Otherwise, she does not have any issues or concerns at this time.   Labs independently reviewed: 06/2020 - HGB 12.3, PLT 253, TSH normal, free T4 normal 01/2020 - potassium 4.8, BUN 25, serum creatinine 1.32, BUN 4.2, AST/ALT normal, TC 170, TG 138, HDL 78, LDL 64  Past Medical History:  Diagnosis Date  . Chronic kidney disease   . Hyperlipidemia   . Hypertension   . Premature beats  Past Surgical History:  Procedure Laterality Date  . APPENDECTOMY    . CESAREAN SECTION    . EYE SURGERY     cataract and eyelid  . HERNIA REPAIR      Current Medications: Current Meds  Medication Sig  . Acetaminophen (TYLENOL ARTHRITIS PAIN PO) Take 650 mg by mouth daily.  Marland Kitchen. amLODipine (NORVASC) 10 MG tablet Take 10 mg by mouth daily.  . enalapril (VASOTEC) 20 MG tablet Take 20  mg by mouth daily.  . Ergocalciferol (VITAMIN D2) 2000 UNITS TABS Take 1 tablet by mouth daily.  Marland Kitchen. ezetimibe-simvastatin (VYTORIN) 10-20 MG tablet TAKE 1 TABLET AT BEDTIME  . propranolol (INDERAL) 10 MG tablet Take 1 tablet (10 mg total) by mouth daily as needed (palpitations).  . zolpidem (AMBIEN) 5 MG tablet Take 2.5 mg by mouth at bedtime as needed.     Allergies:   Patient has no known allergies.   Social History   Socioeconomic History  . Marital status: Widowed    Spouse name: Not on file  . Number of children: Not on file  . Years of education: Not on file  . Highest education level: Not on file  Occupational History  . Not on file  Tobacco Use  . Smoking status: Never Smoker  . Smokeless tobacco: Never Used  Vaping Use  . Vaping Use: Never used  Substance and Sexual Activity  . Alcohol use: No  . Drug use: No  . Sexual activity: Not Currently  Other Topics Concern  . Not on file  Social History Narrative   Lives at [place]    HAs living will, full code   Social Determinants of Health   Financial Resource Strain: Low Risk   . Difficulty of Paying Living Expenses: Not hard at all  Food Insecurity: No Food Insecurity  . Worried About Programme researcher, broadcasting/film/videounning Out of Food in the Last Year: Never true  . Ran Out of Food in the Last Year: Never true  Transportation Needs: No Transportation Needs  . Lack of Transportation (Medical): No  . Lack of Transportation (Non-Medical): No  Physical Activity: Insufficiently Active  . Days of Exercise per Week: 3 days  . Minutes of Exercise per Session: 30 min  Stress: No Stress Concern Present  . Feeling of Stress : Not at all  Social Connections: Not on file     Family History:  The patient's family history includes Breast cancer (age of onset: 2150) in her cousin; Diabetes in her mother; Stroke in her father.  ROS:   Review of Systems  Constitutional: Positive for malaise/fatigue. Negative for chills, diaphoresis, fever and weight  loss.  HENT: Negative for congestion.   Eyes: Negative for discharge and redness.  Respiratory: Negative for cough, sputum production, shortness of breath and wheezing.   Cardiovascular: Negative for chest pain, palpitations, orthopnea, claudication, leg swelling and PND.  Gastrointestinal: Negative for abdominal pain, heartburn, nausea and vomiting.  Musculoskeletal: Positive for joint pain. Negative for falls and myalgias.       Left shoulder discomfort with rotational movement  Skin: Negative for rash.  Neurological: Negative for dizziness, tingling, tremors, sensory change, speech change, focal weakness, loss of consciousness and weakness.  Endo/Heme/Allergies: Does not bruise/bleed easily.  Psychiatric/Behavioral: Negative for substance abuse. The patient is not nervous/anxious.   All other systems reviewed and are negative.    EKGs/Labs/Other Studies Reviewed:    Studies reviewed were summarized above. The additional studies were reviewed today:  Lexiscan MPI 02/21/2020:  The study is normal.  This is a low risk study.  The left ventricular ejection fraction is hyperdynamic (>65%).  There was no ST segment deviation noted during stress.  No T wave inversion was noted during stress. __________  2D echo 09/2018: - Left ventricle: The cavity size was normal. Systolic function was  normal. The estimated ejection fraction was in the range of 60%  to 65%. Wall motion was normal; there were no regional wall  motion abnormalities. Doppler parameters are consistent with  abnormal left ventricular relaxation (grade 1 diastolic  dysfunction).  - Mitral valve: There was mild regurgitation.  - Left atrium: The atrium was normal in size.  - Right ventricle: Systolic function was normal.  - Pulmonary arteries: Systolic pressure was within the normal  range. __________  ETT 08/2018:  Blood pressure demonstrated a normal response to exercise.  There was no ST  segment deviation noted during stress.  No T wave inversion was noted during stress.  Normal treadmill stress test with no evidence of ischemia. Below average exercise capacity with exercise duration of 3 minutes and 39 seconds achieving a maximum heart rate of 130 bpm which is 89% of maximal predicted heart rate.  Mild PACs noted in recovery. __________  Luci [facility] 07/2018: Normal sinus rhythm with an average heart rate of 62 bpm. 4 beat run of wide-complex tachycardia at a rate of 105 bpm. 18 episodes of supraventricular tachycardia noted with the longest lasting 16 beats at a rate of 109 bpm. Fastest tachycardia was 162 bpm but only lasted 6 beats. Occasional PACs with rare PVCs.   EKG:  EKG is ordered today.  The EKG ordered today demonstrates sinus bradycardia, 56 bpm, no acute ST-T changes  Recent Labs: 02/09/2020: ALT 12; BUN 25; Creatinine, Ser 1.32; Potassium 4.8; Sodium 140 06/28/2020: Hemoglobin 12.3; Platelets 253.0; TSH 2.47  Recent Lipid Panel    Component Value Date/Time   CHOL 170 02/09/2020 1159   TRIG 138 02/09/2020 1159   TRIG 68 12/12/2009 1413   HDL 78 02/09/2020 1159   CHOLHDL 2.2 02/09/2020 1159   VLDL 28 02/09/2020 1159   LDLCALC 64 02/09/2020 1159    PHYSICAL EXAM:    VS:  BP 124/70 (BP Location: Left Arm, Patient Position: Sitting, Cuff Size: Normal)   Pulse (!) 56   Ht 5' (1.524 m)   Wt 156 lb (70.8 kg)   SpO2 98%   BMI 30.47 kg/m   BMI: Body mass index is 30.47 kg/m.  Physical Exam Vitals reviewed.  Constitutional:      Appearance: She is well-developed and well-nourished.  HENT:     Head: Normocephalic and atraumatic.  Eyes:     General:        Right eye: No discharge.        Left eye: No discharge.  Neck:     Vascular: No JVD.  Cardiovascular:     Rate and Rhythm: Regular rhythm. Bradycardia present.     Pulses: No midsystolic click and no opening snap.          Posterior tibial pulses are 2+ on the right side and 2+ on the left  side.     Heart sounds: Normal heart sounds, S1 normal and S2 normal. Heart sounds not distant. No murmur heard. No friction rub.  Pulmonary:     Effort: Pulmonary effort is normal. No respiratory distress.     Breath sounds: Normal breath sounds. No decreased breath sounds, wheezing or rales.  Chest:  Chest wall: No tenderness.  Abdominal:     General: There is no distension.     Palpations: Abdomen is soft.     Tenderness: There is no abdominal tenderness.  Musculoskeletal:        General: No edema.     Cervical back: Normal range of motion.  Skin:    General: Skin is warm and dry.     Nails: There is no clubbing or cyanosis.  Neurological:     Mental Status: She is alert and oriented to person, place, and time.  Psychiatric:        Mood and Affect: Mood and affect normal.        Speech: Speech normal.        Behavior: Behavior normal.        Thought Content: Thought content normal.        Judgment: Judgment normal.     Wt Readings from Last 3 Encounters:  10/13/20 156 lb (70.8 kg)  07/04/20 155 lb 8 oz (70.5 kg)  03/21/20 154 lb (69.9 kg)     ASSESSMENT & PLAN:   1. Paroxysmal SVT: Quiescent.  Not requiring standing beta-blocker or nondihydropyridine calcium channel blocker secondary to underlying bradycardic rates.  She has not needed any as needed propanolol.  Overall, her burden of tachypalpitations continues to be quite low and self-limiting.  Continue as needed propanolol.  Should her symptoms become more frequent or longer lasting we could consider referral to EP for consideration of SVT ablation.  2. Sinus bradycardia: Asymptomatic with prior appropriate chronotropic response demonstrated in the office.  Avoid standing beta-blocker or nondihydropyridine calcium channel blocker.  No indication for PPM.  3. HTN: Blood pressure is well controlled in the office today.  4. HLD: LDL of 64 from 01/2019 with normal LFT at that time.  She remains on  Vytorin.  5. Dilated ascending aorta: Incidentally noted on echo in 02/2020. Follow-up echo in 02/2021.  Optimal blood pressure control.  6. CKD stage III: Followed by nephrology. Stable on last check.  Has follow-up with nephrology next month.  7. Left shoulder discomfort: Appears to be musculoskeletal with rotational movement.  Recent Lexiscan MPI low risk.  If surgical intervention is needed down the road we can risk stratify her at that time.  Follow-up with PCP.  Disposition: F/u with Dr. Kirke Corin or an APP in 6 months.   Medication Adjustments/Labs and Tests Ordered: Current medicines are reviewed at length with the patient today.  Concerns regarding medicines are outlined above. Medication changes, Labs and Tests ordered today are summarized above and listed in the Patient Instructions accessible in Encounters.   Signed, Eula Listen, PA-C 10/13/2020 9:50 AM     [facility] [place] [address] [place], [place] [address] [phone]

## 2020-10-13 ENCOUNTER — Ambulatory Visit (INDEPENDENT_AMBULATORY_CARE_PROVIDER_SITE_OTHER): Payer: Medicare PPO | Admitting: Physician Assistant

## 2020-10-13 ENCOUNTER — Encounter: Payer: Self-pay | Admitting: Physician Assistant

## 2020-10-13 ENCOUNTER — Other Ambulatory Visit: Payer: Self-pay

## 2020-10-13 VITALS — BP 124/70 | HR 56 | Ht 60.0 in | Wt 156.0 lb

## 2020-10-13 DIAGNOSIS — R001 Bradycardia, unspecified: Secondary | ICD-10-CM | POA: Diagnosis not present

## 2020-10-13 DIAGNOSIS — E782 Mixed hyperlipidemia: Secondary | ICD-10-CM

## 2020-10-13 DIAGNOSIS — M25512 Pain in left shoulder: Secondary | ICD-10-CM

## 2020-10-13 DIAGNOSIS — I10 Essential (primary) hypertension: Secondary | ICD-10-CM | POA: Diagnosis not present

## 2020-10-13 DIAGNOSIS — I77810 Thoracic aortic ectasia: Secondary | ICD-10-CM

## 2020-10-13 DIAGNOSIS — I7121 Aneurysm of the ascending aorta, without rupture: Secondary | ICD-10-CM

## 2020-10-13 DIAGNOSIS — I712 Thoracic aortic aneurysm, without rupture: Secondary | ICD-10-CM

## 2020-10-13 DIAGNOSIS — I471 Supraventricular tachycardia: Secondary | ICD-10-CM

## 2020-10-13 NOTE — Patient Instructions (Signed)
Medication Instructions:  No changes  *If you need a refill on your cardiac medications before your next appointment, please call your pharmacy*   Lab Work: None  If you have labs (blood work) drawn today and your tests are completely normal, you will receive your results only by: Marland Kitchen MyChart Message (if you have MyChart) OR . A paper copy in the mail If you have any lab test that is abnormal or we need to change your treatment, we will call you to review the results.   Testing/Procedures: Echo to be done in June 2022  Your physician has requested that you have an echocardiogram. Echocardiography is a painless test that uses sound waves to create images of your heart. It provides your doctor with information about the size and shape of your heart and how well your heart's chambers and valves are working. This procedure takes approximately one hour. There are no restrictions for this procedure.     Follow-Up: At [facility], you and your health needs are our priority.  As part of our continuing mission to provide you with exceptional heart care, we have created designated Provider Care Teams.  These Care Teams include your primary Cardiologist (physician) and Advanced Practice Providers (APPs -  Physician Assistants and Nurse Practitioners) who all work together to provide you with the care you need, when you need it.  We recommend signing up for the patient portal called "MyChart".  Sign up information is provided on this After Visit Summary.  MyChart is used to connect with patients for Virtual Visits (Telemedicine).  Patients are able to view lab/test results, encounter notes, upcoming appointments, etc.  Non-urgent messages can be sent to your provider as well.   To learn more about what you can do with MyChart, go to [email or web address].    Your next appointment:   Follow up after echocardiogram has been done in June 2022  The format for your next appointment:   In  Person  Provider:   Lorine Bears, MD or Eula Listen, PA-C

## 2020-10-30 ENCOUNTER — Telehealth: Payer: Self-pay | Admitting: Family Medicine

## 2020-10-30 DIAGNOSIS — E538 Deficiency of other specified B group vitamins: Secondary | ICD-10-CM

## 2020-10-30 DIAGNOSIS — Z1159 Encounter for screening for other viral diseases: Secondary | ICD-10-CM

## 2020-10-30 DIAGNOSIS — E782 Mixed hyperlipidemia: Secondary | ICD-10-CM

## 2020-10-30 DIAGNOSIS — N1832 Chronic kidney disease, stage 3b: Secondary | ICD-10-CM | POA: Diagnosis not present

## 2020-10-30 NOTE — Telephone Encounter (Signed)
-----   Message from Aquilla Solian, RT sent at 10/23/2020  1:44 PM EST ----- Regarding: Lab Orders for Tuesday 2.22.2022 Please place lab orders for  Tuesday 2.22.2022, office visit for 4 month f/u on Tuesday 3.1.2022 Thank you, Jones Bales RT(R)

## 2020-11-02 DIAGNOSIS — N1831 Chronic kidney disease, stage 3a: Secondary | ICD-10-CM | POA: Diagnosis not present

## 2020-11-02 DIAGNOSIS — R809 Proteinuria, unspecified: Secondary | ICD-10-CM | POA: Diagnosis not present

## 2020-11-02 DIAGNOSIS — N2581 Secondary hyperparathyroidism of renal origin: Secondary | ICD-10-CM | POA: Diagnosis not present

## 2020-11-02 DIAGNOSIS — I10 Essential (primary) hypertension: Secondary | ICD-10-CM | POA: Diagnosis not present

## 2020-11-07 ENCOUNTER — Other Ambulatory Visit: Payer: Self-pay

## 2020-11-07 ENCOUNTER — Other Ambulatory Visit (INDEPENDENT_AMBULATORY_CARE_PROVIDER_SITE_OTHER): Payer: Medicare PPO

## 2020-11-07 DIAGNOSIS — Z1159 Encounter for screening for other viral diseases: Secondary | ICD-10-CM | POA: Diagnosis not present

## 2020-11-07 DIAGNOSIS — E538 Deficiency of other specified B group vitamins: Secondary | ICD-10-CM

## 2020-11-07 LAB — VITAMIN B12: Vitamin B-12: 277 pg/mL (ref 211–911)

## 2020-11-07 NOTE — Progress Notes (Signed)
No critical labs need to be addressed urgently. We will discuss labs in detail at upcoming office visit.   

## 2020-11-08 LAB — HEPATITIS C ANTIBODY
Hepatitis C Ab: NONREACTIVE
SIGNAL TO CUT-OFF: 0 (ref <1.00)

## 2020-11-14 ENCOUNTER — Other Ambulatory Visit: Payer: Self-pay

## 2020-11-14 ENCOUNTER — Encounter: Payer: Self-pay | Admitting: Family Medicine

## 2020-11-14 ENCOUNTER — Ambulatory Visit (INDEPENDENT_AMBULATORY_CARE_PROVIDER_SITE_OTHER): Payer: Medicare PPO | Admitting: Family Medicine

## 2020-11-14 VITALS — BP 114/70 | HR 55 | Temp 97.9°F | Ht 59.5 in | Wt 155.8 lb

## 2020-11-14 DIAGNOSIS — I10 Essential (primary) hypertension: Secondary | ICD-10-CM | POA: Diagnosis not present

## 2020-11-14 DIAGNOSIS — N1832 Chronic kidney disease, stage 3b: Secondary | ICD-10-CM

## 2020-11-14 DIAGNOSIS — Z0000 Encounter for general adult medical examination without abnormal findings: Secondary | ICD-10-CM

## 2020-11-14 DIAGNOSIS — E782 Mixed hyperlipidemia: Secondary | ICD-10-CM | POA: Diagnosis not present

## 2020-11-14 DIAGNOSIS — E538 Deficiency of other specified B group vitamins: Secondary | ICD-10-CM

## 2020-11-14 DIAGNOSIS — I471 Supraventricular tachycardia: Secondary | ICD-10-CM | POA: Diagnosis not present

## 2020-11-14 DIAGNOSIS — N2581 Secondary hyperparathyroidism of renal origin: Secondary | ICD-10-CM

## 2020-11-14 NOTE — Assessment & Plan Note (Signed)
Followed by Dr. Cherylann Ratel, nephrology.

## 2020-11-14 NOTE — Assessment & Plan Note (Signed)
Decreasing.. start oral B12 1000 mcg daily

## 2020-11-14 NOTE — Assessment & Plan Note (Signed)
Stable, chronic.  Continue current medication.  On enalapril 20 mg daily, amlodipine 10 mg daily and inderal 10 mg daily

## 2020-11-14 NOTE — Assessment & Plan Note (Signed)
Followed by cardiology 

## 2020-11-14 NOTE — Assessment & Plan Note (Signed)
Followed by Dr. Cherylann Ratel, nephrology. Plan repeat DEXA next year.

## 2020-11-14 NOTE — Assessment & Plan Note (Signed)
Stable, chronic.  Continue current medication.   Vytorin 10/20 mg daily

## 2020-11-14 NOTE — Progress Notes (Signed)
Patient ID: Natasha Sawyer, female    DOB: 12-Jan-1941, 80 y.o.   MRN: [id number]  This visit was conducted in person.  BP 114/70   Pulse (!) 55   Temp 97.9 F (36.6 C) (Temporal)   Ht 4' 11.5" (1.511 m)   Wt 155 lb 12 oz (70.6 kg)   SpO2 96%   BMI 30.93 kg/m    CC:  Chief Complaint  Patient presents with  . Annual Exam    Part 2    Subjective:   HPI: Natasha Sawyer is a 80 y.o. female presenting on 11/14/2020 for Annual Exam (Part 2)  The patient saw a LPN or RN for medicare wellness visit.  Prevention and wellness was reviewed in detail. Note reviewed and important notes copied below. Health Maintenance: Mammogram- due Dexa- due Abnormal Screenings: none  11/14/20  Elevated Cholesterol:  Due for re-eval in 01/2021 on combo simvastatin zetia  Lab Results  Component Value Date   CHOL 170 02/09/2020   HDL 78 02/09/2020   LDLCALC 64 02/09/2020   TRIG 138 02/09/2020   CHOLHDL 2.2 02/09/2020  Using medications without problems: none Muscle aches:  none Diet compliance: moderate Exercise: minimal given cold Other complaints:  B12 def: She is not on any oral supplement.  Initially improved after injections 9 months ago.  Hypertension:    At goal on enalapril 20 mg daily, amlodipine 10 mg daily and inderal 10 mg daily BP Readings from Last 3 Encounters:  11/14/20 114/70  10/13/20 124/70  07/04/20 122/74  Using medication without problems or lightheadedness:  none Chest pain with exertion: none Edema:none Short of breath:none Average home BPs: Other issues:  PSVT followed by cardiology Eula Listen PA  On BBlocker.     CKD and secondary hyperparathyroidism followed closely by Dr. Cherylann Ratel. Last OV from 11/02/2020 reviewed.  GFR at baseline 45 at last check   Relevant past medical, surgical, family and social history reviewed and updated as indicated. Interim medical history since our last visit reviewed. Allergies and medications reviewed and  updated. Outpatient Medications Prior to Visit  Medication Sig Dispense Refill  . Acetaminophen (TYLENOL ARTHRITIS PAIN PO) Take 650 mg by mouth daily.    Marland Kitchen amLODipine (NORVASC) 10 MG tablet Take 10 mg by mouth daily.    . enalapril (VASOTEC) 20 MG tablet Take 20 mg by mouth daily.    . Ergocalciferol (VITAMIN D2) 2000 UNITS TABS Take 1 tablet by mouth daily.    Marland Kitchen ezetimibe-simvastatin (VYTORIN) 10-20 MG tablet TAKE 1 TABLET AT BEDTIME 90 tablet 1  . propranolol (INDERAL) 10 MG tablet Take 1 tablet (10 mg total) by mouth daily as needed (palpitations). 12 tablet 0  . zolpidem (AMBIEN) 5 MG tablet Take 2.5 mg by mouth at bedtime as needed.      No facility-administered medications prior to visit.     Per HPI unless specifically indicated in ROS section below Review of Systems  Constitutional: Negative for fatigue and fever.  HENT: Negative for congestion.   Eyes: Negative for pain.  Respiratory: Negative for cough and shortness of breath.   Cardiovascular: Negative for chest pain, palpitations and leg swelling.  Gastrointestinal: Negative for abdominal pain.  Genitourinary: Negative for dysuria and vaginal bleeding.  Musculoskeletal: Negative for back pain.  Neurological: Negative for syncope, light-headedness and headaches.  Psychiatric/Behavioral: Negative for dysphoric mood.   Objective:  BP 114/70   Pulse (!) 55   Temp 97.9 F (36.6 C) (Temporal)  Ht 4' 11.5" (1.511 m)   Wt 155 lb 12 oz (70.6 kg)   SpO2 96%   BMI 30.93 kg/m   Wt Readings from Last 3 Encounters:  11/14/20 155 lb 12 oz (70.6 kg)  10/13/20 156 lb (70.8 kg)  07/04/20 155 lb 8 oz (70.5 kg)      Physical Exam Constitutional:      General: She is not in acute distress.Vital signs are normal.     Appearance: Normal appearance. She is well-developed and well-nourished. She is obese. She is not ill-appearing or toxic-appearing.  HENT:     Head: Normocephalic.     Right Ear: Hearing, tympanic membrane, ear  canal and external ear normal. Tympanic membrane is not erythematous, retracted or bulging.     Left Ear: Hearing, tympanic membrane, ear canal and external ear normal. Tympanic membrane is not erythematous, retracted or bulging.     Nose: No mucosal edema or rhinorrhea.     Right Sinus: No maxillary sinus tenderness or frontal sinus tenderness.     Left Sinus: No maxillary sinus tenderness or frontal sinus tenderness.     Mouth/Throat:     Mouth: Oropharynx is clear and moist and mucous membranes are normal.     Pharynx: Uvula midline.  Eyes:     General: Lids are normal. Lids are everted, no foreign bodies appreciated.     Extraocular Movements: EOM normal.     Conjunctiva/sclera: Conjunctivae normal.     Pupils: Pupils are equal, round, and reactive to light.  Neck:     Thyroid: No thyroid mass or thyromegaly.     Vascular: No carotid bruit.     Trachea: Trachea normal.  Cardiovascular:     Rate and Rhythm: Normal rate and regular rhythm.     Pulses: Normal pulses and intact distal pulses.     Heart sounds: Normal heart sounds, S1 normal and S2 normal. No murmur heard. No friction rub. No gallop.   Pulmonary:     Effort: Pulmonary effort is normal. No tachypnea or respiratory distress.     Breath sounds: Normal breath sounds. No decreased breath sounds, wheezing, rhonchi or rales.  Abdominal:     General: Bowel sounds are normal.     Palpations: Abdomen is soft.     Tenderness: There is no abdominal tenderness.  Musculoskeletal:     Cervical back: Normal range of motion and neck supple.  Skin:    General: Skin is warm, dry and intact.     Findings: No rash.  Neurological:     Mental Status: She is alert.  Psychiatric:        Mood and Affect: Mood is not anxious or depressed.        Speech: Speech normal.        Behavior: Behavior normal. Behavior is cooperative.        Thought Content: Thought content normal.        Cognition and Memory: Cognition and memory normal.         Judgment: Judgment normal.       Results for orders placed or performed in visit on 11/07/20  Vitamin B12  Result Value Ref Range   Vitamin B-12 277 211 - 911 pg/mL  Hepatitis C antibody  Result Value Ref Range   Hepatitis C Ab NON-REACTIVE NON-REACTI   SIGNAL TO CUT-OFF 0.00 <1.00    This visit occurred during the SARS-CoV-2 public health emergency.  Safety protocols were in place, including screening questions prior  to the visit, additional usage of staff PPE, and extensive cleaning of exam room while observing appropriate contact time as indicated for disinfecting solutions.   COVID 19 screen:  No recent travel or known exposure to COVID19 The patient denies respiratory symptoms of COVID 19 at this time. The importance of social distancing was discussed today.   Assessment and Plan   The patient's preventative maintenance and recommended screening tests for an annual wellness exam were reviewed in full today. Brought up to date unless services declined.  Counselled on the importance of diet, exercise, and its role in overall health and mortality. The patient's FH and SH was reviewed, including their home life, tobacco status, and drug and alcohol status.   Mammo:08/29/2020 PAP/DVE: last pap 2012 nml, okay with stopping these., asymptomatic, no family history of ovarianor uterine cancer: Colon: last done many years ago 2006 : nmlcologuard.No further needed at this time. Nonsmoker Vaccines: COVID , flu uptodate,S/P shingrix and postpone tetanus Bone density -6/2018ostopenia, repeat in 2023.  Problem List Items Addressed This Visit    CKD (chronic kidney disease) stage 3, GFR 30-59 ml/min (HCC) (Chronic)    Followed by Dr. Cherylann Ratel, nephrology.      Essential hypertension, benign (Chronic)    Stable, chronic.  Continue current medication.  On enalapril 20 mg daily, amlodipine 10 mg daily and inderal 10 mg daily         Hyperlipidemia (Chronic)    Stable,  chronic.  Continue current medication.   Vytorin 10/20 mg daily      PSVT (paroxysmal supraventricular tachycardia) (HCC) (Chronic)    Followed by cardiology.      Secondary hyperparathyroidism of renal origin (HCC) (Chronic)    Followed by Dr. Cherylann Ratel, nephrology. Plan repeat DEXA next year.      Vitamin B 12 deficiency    Decreasing.. start oral B12 1000 mcg daily       Other Visit Diagnoses    Routine general medical examination at a health care facility    -  Primary      Kerby Nora, MD

## 2020-11-14 NOTE — Patient Instructions (Addendum)
Start B12 OTC 1000 mcg per day.  Consider probiotic for gut health.. such Culturelle or Align.  Get back to exercise 2-3 times a week.. walking etc.

## 2020-12-20 DIAGNOSIS — Z20822 Contact with and (suspected) exposure to covid-19: Secondary | ICD-10-CM | POA: Diagnosis not present

## 2020-12-21 ENCOUNTER — Other Ambulatory Visit: Payer: Self-pay

## 2020-12-21 ENCOUNTER — Ambulatory Visit: Payer: Medicare PPO

## 2020-12-21 DIAGNOSIS — Z1152 Encounter for screening for COVID-19: Secondary | ICD-10-CM

## 2020-12-21 DIAGNOSIS — Z20822 Contact with and (suspected) exposure to covid-19: Secondary | ICD-10-CM | POA: Diagnosis not present

## 2020-12-22 ENCOUNTER — Ambulatory Visit: Payer: Medicare PPO

## 2021-02-15 ENCOUNTER — Other Ambulatory Visit: Payer: Self-pay | Admitting: Family Medicine

## 2021-02-16 NOTE — Telephone Encounter (Signed)
Pharmacy requests refill on: Ezetimibe-Simvastatin 10-20 mg   LAST REFILL: 08/16/2020 (Q-90, R-1) LAST OV: 11/14/2020 NEXT OV: 11/16/2021 PHARMACY: [facility]

## 2021-02-22 IMAGING — MG DIGITAL DIAGNOSTIC UNILATERAL LEFT MAMMOGRAM WITH TOMO AND CAD
4 series · 4 of 12 positions shown · non-contrast
Comparison: Previous exam(s).

CLINICAL DATA: The patient was called back for a left breast
asymmetry.

EXAM:
DIGITAL DIAGNOSTIC UNILATERAL LEFT MAMMOGRAM WITH CAD AND TOMO

[L ML synth-2D]
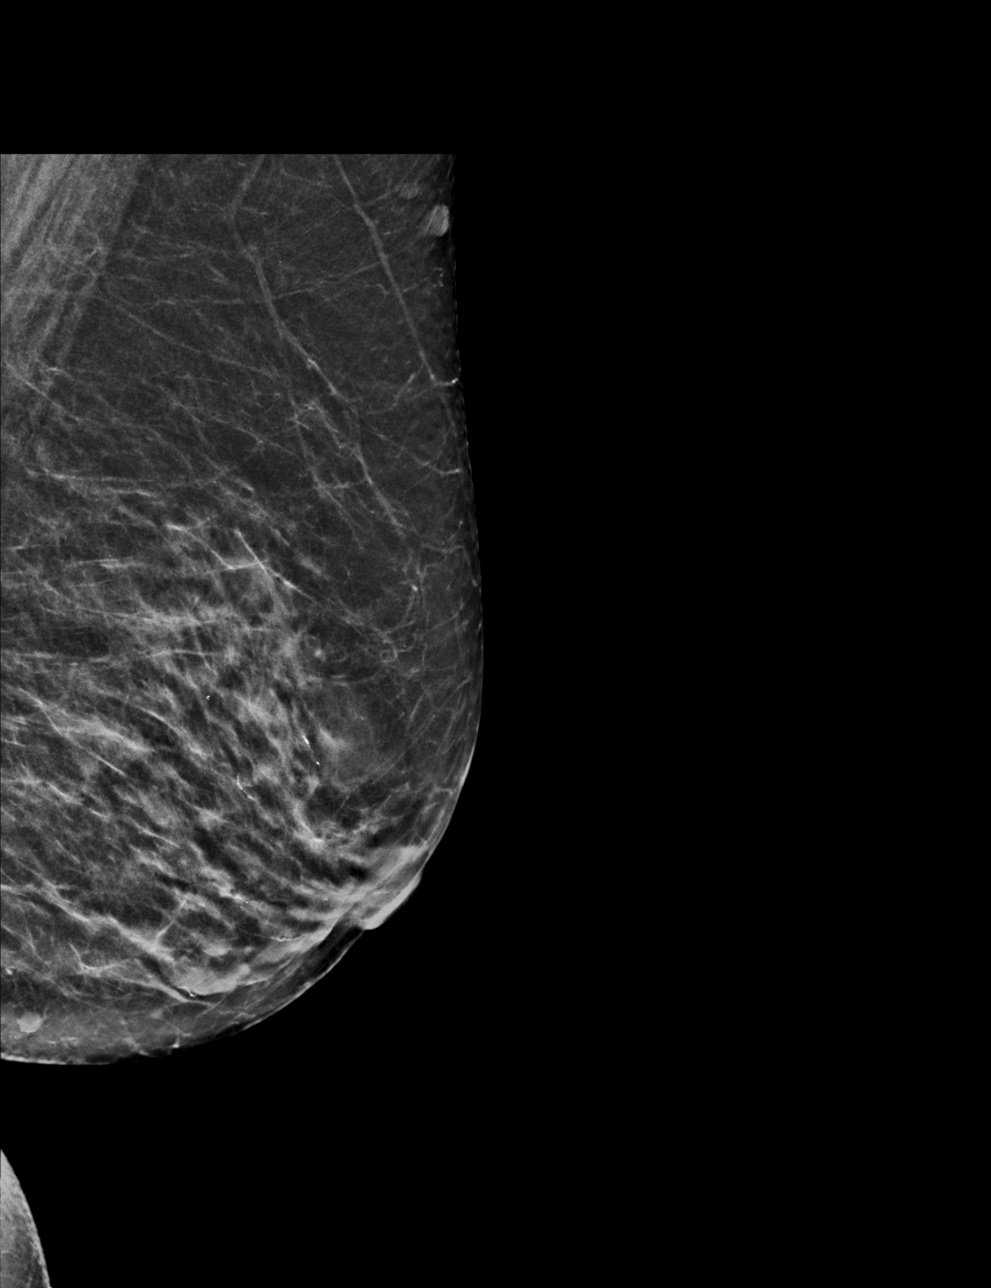

[L CC synth-2D]
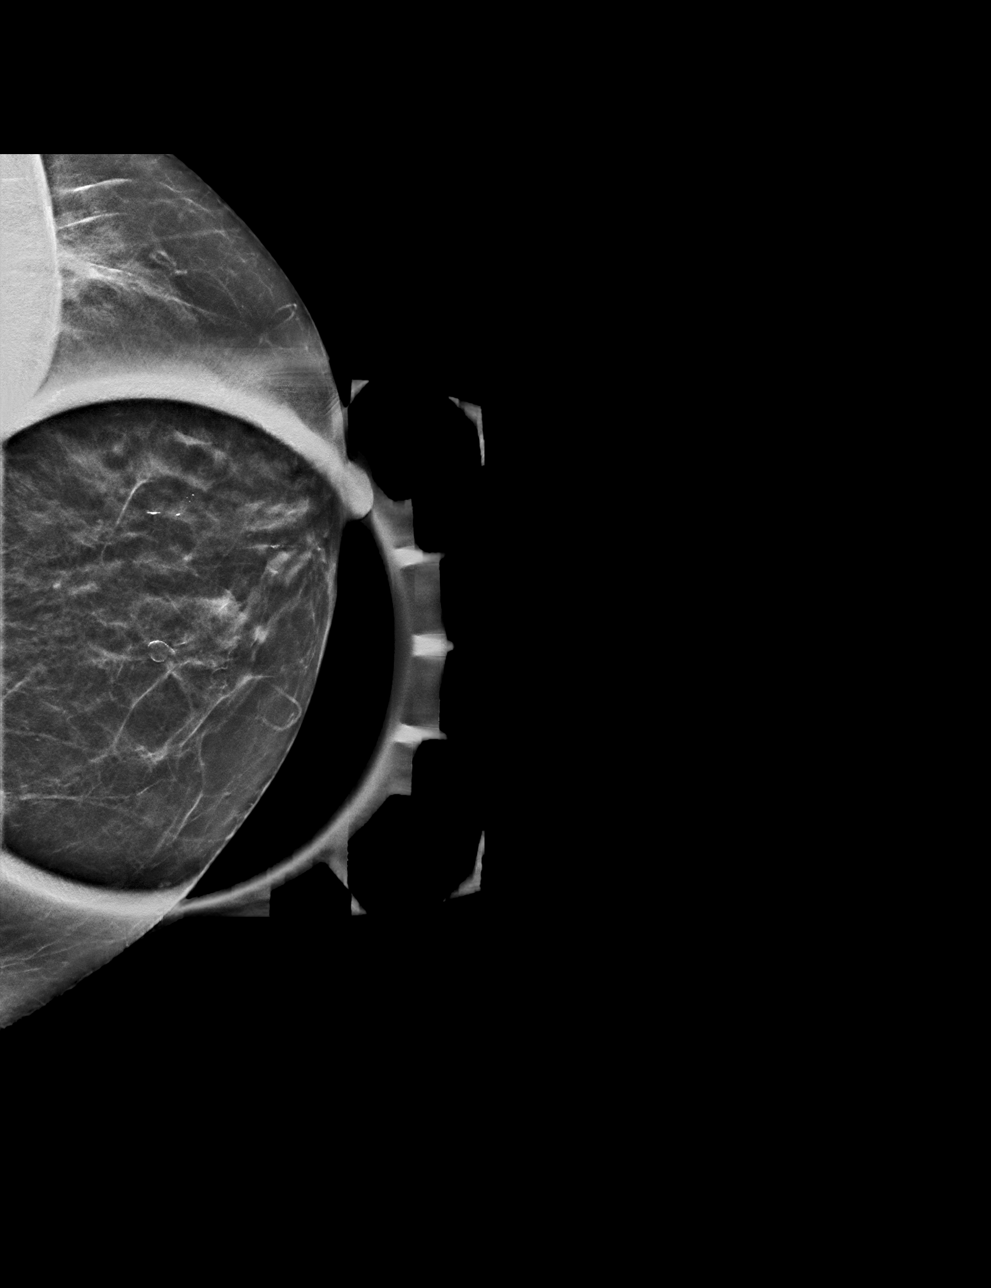

[L ML tomo · tomo slice 27/54.0]
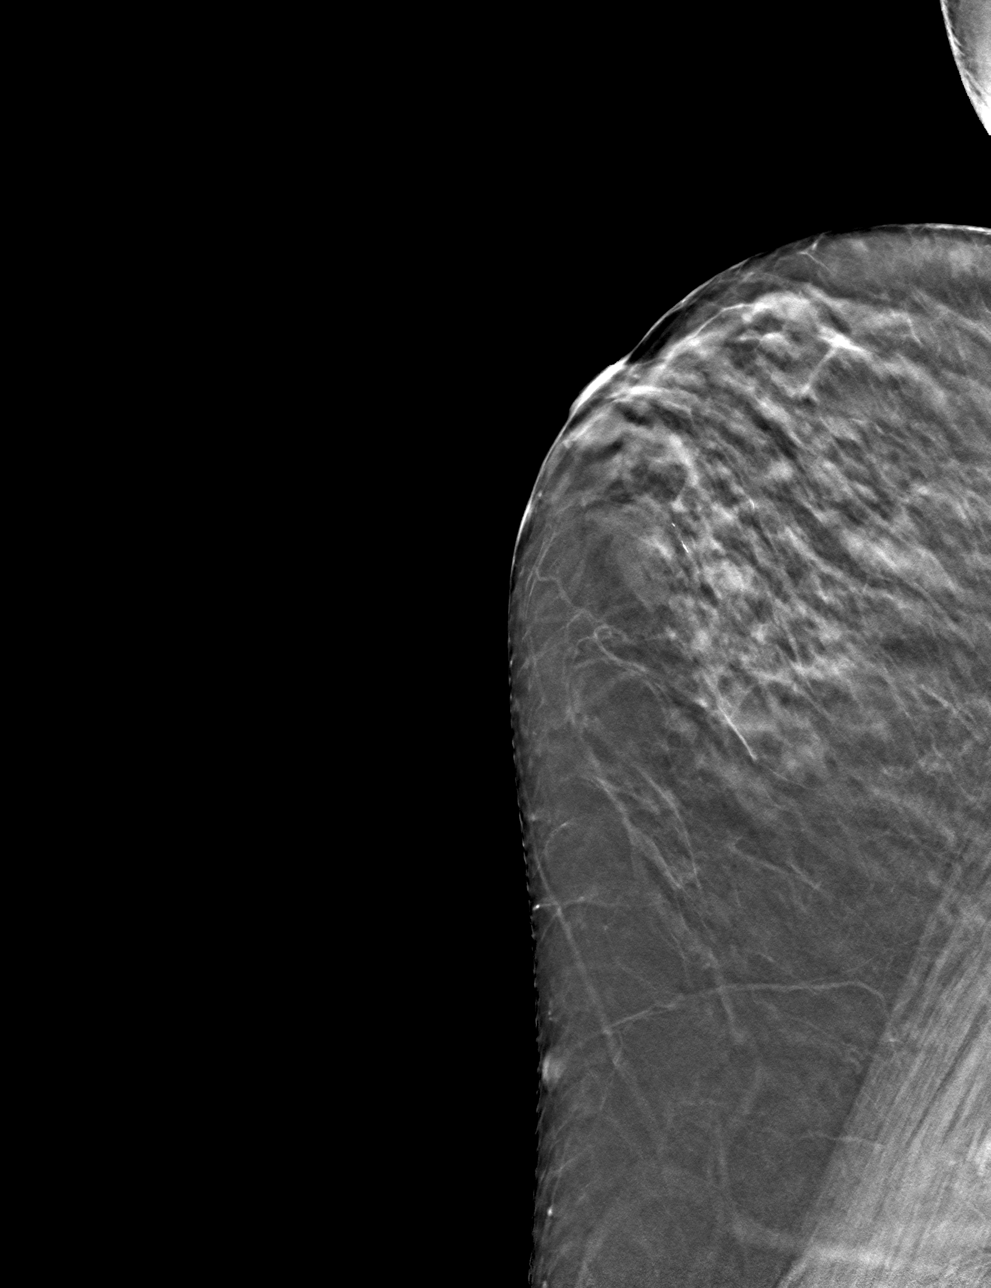

[L CC tomo · tomo slice 21/41.0]
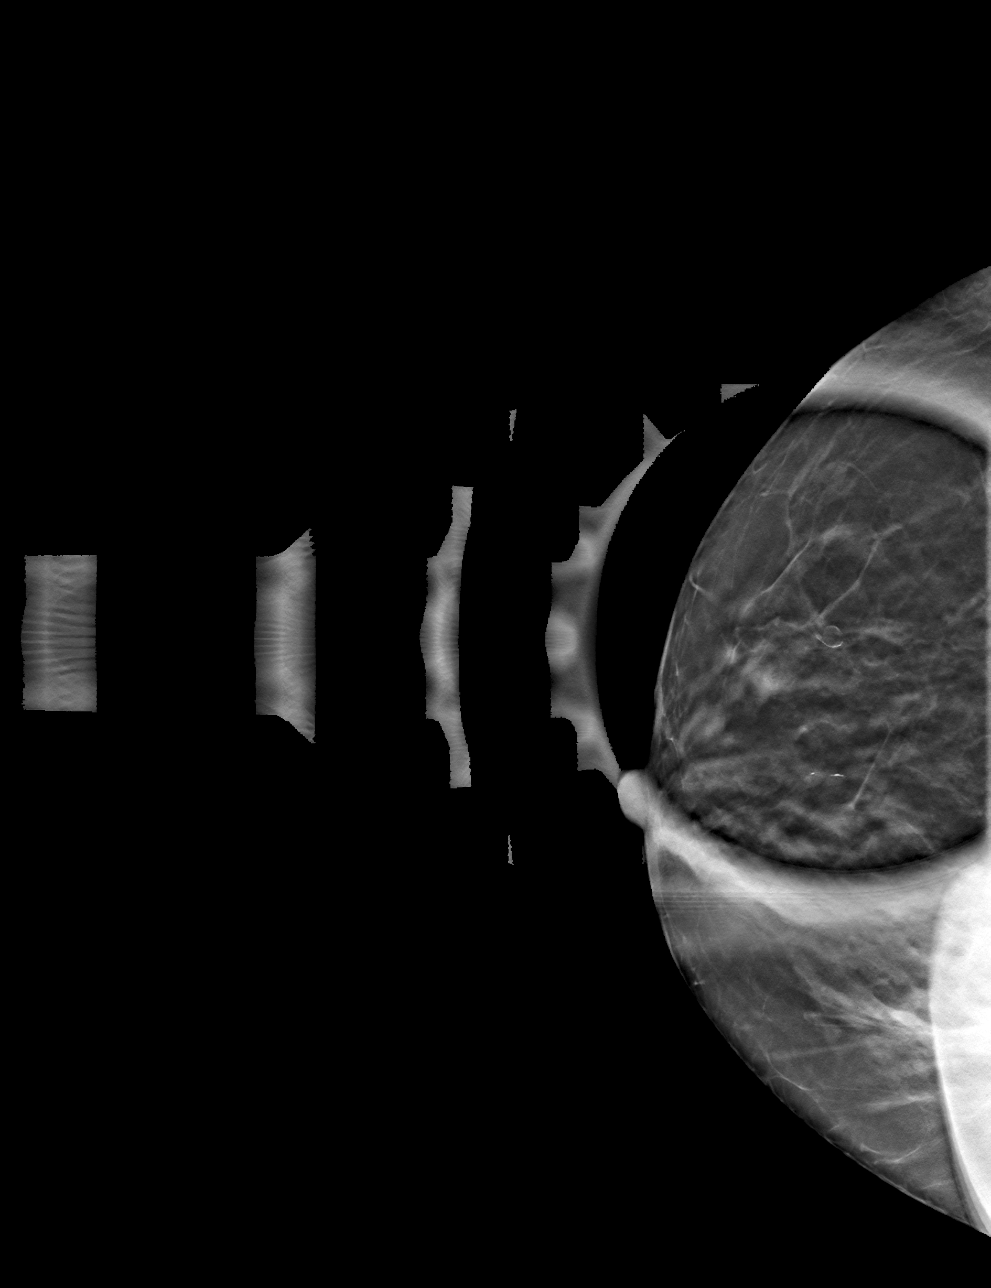

[4 of 12 positions shown; findings below may reference images not displayed]

ACR Breast Density Category b: There are scattered areas of
fibroglandular density.
FINDINGS: The left breast asymmetry resolves with additional imaging.

Mammographic images were processed with CAD.
IMPRESSION: The left breast asymmetry resolves with additional imaging. No
evidence of malignancy.

RECOMMENDATION:
Annual screening mammography.

I have discussed the findings and recommendations with the patient.
Results were also provided in writing at the conclusion of the
visit. If applicable, a reminder letter will be sent to the patient
regarding the next appointment.

BI-RADS CATEGORY  2: Benign.

## 2021-03-05 DIAGNOSIS — N1831 Chronic kidney disease, stage 3a: Secondary | ICD-10-CM | POA: Diagnosis not present

## 2021-03-08 DIAGNOSIS — R809 Proteinuria, unspecified: Secondary | ICD-10-CM | POA: Diagnosis not present

## 2021-03-08 DIAGNOSIS — N2581 Secondary hyperparathyroidism of renal origin: Secondary | ICD-10-CM | POA: Diagnosis not present

## 2021-03-08 DIAGNOSIS — I10 Essential (primary) hypertension: Secondary | ICD-10-CM | POA: Diagnosis not present

## 2021-03-08 DIAGNOSIS — N1831 Chronic kidney disease, stage 3a: Secondary | ICD-10-CM | POA: Diagnosis not present

## 2021-03-09 ENCOUNTER — Ambulatory Visit (INDEPENDENT_AMBULATORY_CARE_PROVIDER_SITE_OTHER): Payer: Medicare PPO

## 2021-03-09 ENCOUNTER — Other Ambulatory Visit: Payer: Self-pay

## 2021-03-09 DIAGNOSIS — I712 Thoracic aortic aneurysm, without rupture: Secondary | ICD-10-CM | POA: Diagnosis not present

## 2021-03-09 DIAGNOSIS — I7121 Aneurysm of the ascending aorta, without rupture: Secondary | ICD-10-CM

## 2021-03-09 LAB — ECHOCARDIOGRAM COMPLETE
AR max vel: 2.83 cm2
AV Area VTI: 2.55 cm2
AV Area mean vel: 2.52 cm2
AV Mean grad: 5 mmHg
AV Peak grad: 10.1 mmHg
Ao pk vel: 1.59 m/s
Area-P 1/2: 3.3 cm2
Calc EF: 58.7 %
S' Lateral: 2.8 cm
Single Plane A2C EF: 55.5 %
Single Plane A4C EF: 61.9 %

## 2021-03-13 ENCOUNTER — Other Ambulatory Visit: Payer: Self-pay

## 2021-03-13 ENCOUNTER — Encounter: Payer: Self-pay | Admitting: Cardiovascular Disease

## 2021-03-13 ENCOUNTER — Ambulatory Visit (INDEPENDENT_AMBULATORY_CARE_PROVIDER_SITE_OTHER): Payer: Medicare PPO | Admitting: Cardiovascular Disease

## 2021-03-13 VITALS — BP 118/70 | HR 52 | Ht 59.5 in | Wt 157.0 lb

## 2021-03-13 DIAGNOSIS — I471 Supraventricular tachycardia: Secondary | ICD-10-CM | POA: Diagnosis not present

## 2021-03-13 DIAGNOSIS — I10 Essential (primary) hypertension: Secondary | ICD-10-CM | POA: Diagnosis not present

## 2021-03-13 DIAGNOSIS — E785 Hyperlipidemia, unspecified: Secondary | ICD-10-CM

## 2021-03-13 NOTE — Patient Instructions (Addendum)

## 2021-03-13 NOTE — Progress Notes (Signed)
Cardiology Office Note   Date:  03/13/2021   ID:  Natasha Sawyer, DOB January 07, 1941, MRN [id number]  PCP:  Excell Seltzer, MD  Cardiologist:   Lorine Bears, MD   Chief Complaint  Patient presents with   Other    Follow up echo. Meds reviewed verbally with pt.      History of Present Illness: Natasha Sawyer is a 80 y.o. female who is here today for follow-up visit regarding palpitations due to short SVT.   She has multiple chronic medical conditions including essential hypertension, hyperlipidemia and mild chronic kidney disease. He was initially seen in 2013 for palpitations, atypical chest pain and shortness of breath.  She underwent a treadmill stress test which was normal.  Echocardiogram showed normal ejection fraction with no significant valvular abnormalities.  48-hour Holter monitor showed normal sinus rhythm with frequent PACs and no other significant arrhythmia.  She was seen again in 2019 for recurrent palpitations.  She had a Zio monitor which showed normal sinus rhythm with a 4 beat run of wide-complex tachycardia, 18 episodes of SVT the longest lasted 16 beats and occasional PACs and PVCs.  She underwent a treadmill stress test in December 2019 which showed no evidence of ischemia.  She was able to exercise for 3 minutes and 39 seconds.  Due to baseline bradycardia, she was not started on any medications but was given propranolol to be used as needed. She has been doing well overall with minimal palpitations.  She does not have to take propranolol.  No chest pain.  He describes stable exertional dyspnea and fatigue.  Chronic kidney disease has been stable. She had an echocardiogram done last week which showed normal LV systolic function with mild mitral regurgitation.  Past Medical History:  Diagnosis Date   Chronic kidney disease    Hyperlipidemia    Hypertension    Premature beats     Past Surgical History:  Procedure Laterality Date   APPENDECTOMY     CESAREAN  SECTION     EYE SURGERY     cataract and eyelid   HERNIA REPAIR       Current Outpatient Medications  Medication Sig Dispense Refill   Acetaminophen (TYLENOL ARTHRITIS PAIN PO) Take 650 mg by mouth daily.     amLODipine (NORVASC) 10 MG tablet Take 10 mg by mouth daily.     Cyanocobalamin (B-12 PO) Take by mouth daily at 8 pm.     enalapril (VASOTEC) 20 MG tablet Take 20 mg by mouth daily.     Ergocalciferol (VITAMIN D2) 2000 UNITS TABS Take 1 tablet by mouth daily.     ezetimibe-simvastatin (VYTORIN) 10-20 MG tablet TAKE 1 TABLET AT BEDTIME 90 tablet 1   propranolol (INDERAL) 10 MG tablet Take 1 tablet (10 mg total) by mouth daily as needed (palpitations). 12 tablet 0   zolpidem (AMBIEN) 5 MG tablet Take 2.5 mg by mouth at bedtime as needed.      No current facility-administered medications for this visit.    Allergies:   Patient has no known allergies.    Social History:  The patient  reports that she has never smoked. She has never used smokeless tobacco. She reports that she does not drink alcohol and does not use drugs.   Family History:  The patient's family history includes Breast cancer (age of onset: 20) in her cousin; Diabetes in her mother; Stroke in her father.    ROS:  Please see the history of  present illness.   Otherwise, review of systems are positive for none.   All other systems are reviewed and negative.    PHYSICAL EXAM: VS:  BP 118/70 (BP Location: Left Arm, Patient Position: Sitting, Cuff Size: Normal)   Pulse (!) 52   Ht 4' 11.5" (1.511 m)   Wt 157 lb (71.2 kg)   SpO2 98%   BMI 31.18 kg/m  , BMI Body mass index is 31.18 kg/m. GEN: Well nourished, well developed, in no acute distress  HEENT: normal  Neck: no JVD, carotid bruits, or masses Cardiac: RRR; no murmurs, rubs, or gallops,no edema  Respiratory:  clear to auscultation bilaterally, normal work of breathing GI: soft, nontender, nondistended, + BS MS: no deformity or atrophy  Skin: warm and  dry, no rash Neuro:  Strength and sensation are intact Psych: euthymic mood, full affect   EKG:  EKG is ordered today. The ekg ordered today demonstrates sinus bradycardia with no significant ST changes.   Recent Labs: 06/28/2020: Hemoglobin 12.3; Platelets 253.0; TSH 2.47    Lipid Panel    Component Value Date/Time   CHOL 170 02/09/2020 1159   TRIG 138 02/09/2020 1159   TRIG 68 12/12/2009 1413   HDL 78 02/09/2020 1159   CHOLHDL 2.2 02/09/2020 1159   VLDL 28 02/09/2020 1159   LDLCALC 64 02/09/2020 1159      Wt Readings from Last 3 Encounters:  03/13/21 157 lb (71.2 kg)  11/14/20 155 lb 12 oz (70.6 kg)  10/13/20 156 lb (70.8 kg)        PAD Screen 08/07/2018  Previous PAD dx? No  Previous surgical procedure? No  Pain with walking? No  Feet/toe relief with dangling? No  Painful, non-healing ulcers? No  Extremities discolored? No      ASSESSMENT AND PLAN:  1.  Paroxysmal supraventricular tachycardia: She has mild symptoms overall and does not require any medication.  She does have propranolol to be used as needed but has not required this medication.  2.  Sinus bradycardia: This has been a chronic issue for her and she does not appear to be symptomatic from this.  3.  Essential hypertension: Blood pressure is controlled.  I recommend continuing amlodipine and enalapril.  4.  Hyperlipidemia: I reviewed most recent lipid profile done last year which showed an LDL of 64 and triglyceride of 68.  Both of these are at target and thus I advised her to continue Vytorin at the current dose.    Disposition:   FU in 12 months.  Signed,  Lorine Bears, MD  03/13/2021 1:52 PM    [facility]

## 2021-03-15 ENCOUNTER — Telehealth: Payer: Self-pay | Admitting: *Deleted

## 2021-03-15 NOTE — Telephone Encounter (Signed)
-----   Message from Sondra Barges, PA-C sent at 03/09/2021  5:44 PM EDT ----- Echo showed normal pump function, normal wall motion, slightly stiffened heart, and a mildly leaky mitral valve.  Premature heartbeats were noted as well.  Aorta was noted to be normal in size and structure.  Recommendations continue optimal blood pressure control.  This was well controlled at her last visit.  Follow-up as previously planned.

## 2021-03-15 NOTE — Telephone Encounter (Signed)
Left voicemail message to call back for review of results.  

## 2021-03-22 ENCOUNTER — Telehealth: Payer: Self-pay | Admitting: *Deleted

## 2021-03-22 NOTE — Telephone Encounter (Signed)
Left voicemail message to call back for review of results.  

## 2021-03-22 NOTE — Chronic Care Management (AMB) (Signed)
  Chronic Care Management   Note  03/22/2021 Name: SHYA KOVATCH MRN: [id number] DOB: 04-21-1941  ELLIOT SIMONEAUX is a 80 y.o. year old female who is a primary care patient of Bedsole, Amy E, MD. I reached out to Geoffry Paradise by phone today in response to a referral sent by Ms. Ander Gaster [place]'s PCP Jinny Sanders, MD     Ms. Lincoln was given information about Chronic Care Management services today including:  CCM service includes personalized support from designated clinical staff supervised by her physician, including individualized plan of care and coordination with other care providers 24/7 contact phone numbers for assistance for urgent and routine care needs. Service will only be billed when office clinical staff spend 20 minutes or more in a month to coordinate care. Only one practitioner may furnish and bill the service in a calendar month. The patient may stop CCM services at any time (effective at the end of the month) by phone call to the office staff. The patient will be responsible for cost sharing (co-pay) of up to 20% of the service fee (after annual deductible is met).  Patient agreed to services and verbal consent obtained.   Follow up plan: Telephone appointment with care management team member scheduled for:04/13/2021  Julian Hy, [place], [place] Management  Direct Dial: [phone]

## 2021-03-23 NOTE — Telephone Encounter (Signed)
Patient states this was reviewed at her last appointment with Dr. Kirke Corin. Apologized for the repeat call and she was appreciative with no further questions.

## 2021-04-10 ENCOUNTER — Telehealth: Payer: Self-pay | Admitting: Family Medicine

## 2021-04-10 NOTE — Telephone Encounter (Signed)
Rs. Natasha Sawyer called in wanted to know about changing the time or date due to she has family coming in

## 2021-04-13 ENCOUNTER — Telehealth: Payer: Medicare PPO

## 2021-05-01 ENCOUNTER — Ambulatory Visit: Payer: Medicare PPO

## 2021-05-01 DIAGNOSIS — E785 Hyperlipidemia, unspecified: Secondary | ICD-10-CM

## 2021-05-01 DIAGNOSIS — I10 Essential (primary) hypertension: Secondary | ICD-10-CM

## 2021-05-01 DIAGNOSIS — E782 Mixed hyperlipidemia: Secondary | ICD-10-CM

## 2021-05-01 NOTE — Patient Instructions (Signed)
  Care Management   Follow Up Note   05/01/2021 Name: TAMAKIA PORTO MRN: [id number] DOB: Mar 15, 1941   Referred by: Excell Seltzer, MD Reason for referral : No chief complaint on file.   Thank you for allowing me to share the care management and care coordination services that are available to you as part of your health and services through your primary care provider and medical home.  Please reach out to me at [phone] if the care management/ care coordination team may be of assistance to you in the future.   George Ina RN,BSN,CCM RN Case Manager [facility] [place]  [phone]

## 2021-05-01 NOTE — Chronic Care Management (AMB) (Signed)
  Care Management   Follow Up Note   05/01/2021 Name: Natasha Sawyer MRN: [id number] DOB: 27-Jun-1941   Referred by: Excell Seltzer, MD Reason for referral : No chief complaint on file.   Successful contact was made with the patient to discuss care management and care coordination services. Patient declines engagement at this time.   Follow Up Plan: The patient has been provided with contact information for the care management team and has been advised to call with any health related questions or concerns.   George Ina RN,BSN,CCM RN Case Manager [facility] [place]  [phone]

## 2021-07-04 DIAGNOSIS — N1831 Chronic kidney disease, stage 3a: Secondary | ICD-10-CM | POA: Diagnosis not present

## 2021-07-09 DIAGNOSIS — N2581 Secondary hyperparathyroidism of renal origin: Secondary | ICD-10-CM | POA: Diagnosis not present

## 2021-07-09 DIAGNOSIS — N1831 Chronic kidney disease, stage 3a: Secondary | ICD-10-CM | POA: Diagnosis not present

## 2021-07-09 DIAGNOSIS — R809 Proteinuria, unspecified: Secondary | ICD-10-CM | POA: Diagnosis not present

## 2021-07-09 DIAGNOSIS — I10 Essential (primary) hypertension: Secondary | ICD-10-CM | POA: Diagnosis not present

## 2021-07-13 ENCOUNTER — Ambulatory Visit: Payer: Medicare PPO

## 2021-08-03 ENCOUNTER — Other Ambulatory Visit: Payer: Self-pay | Admitting: Family Medicine

## 2021-08-03 DIAGNOSIS — Z1231 Encounter for screening mammogram for malignant neoplasm of breast: Secondary | ICD-10-CM

## 2021-09-04 ENCOUNTER — Ambulatory Visit
Admission: RE | Admit: 2021-09-04 | Discharge: 2021-09-04 | Disposition: A | Discharge status: Home / Self Care | Payer: Medicare PPO | Source: Ambulatory Visit | Attending: Family Medicine | Admitting: Family Medicine

## 2021-09-04 ENCOUNTER — Other Ambulatory Visit: Payer: Self-pay

## 2021-09-04 DIAGNOSIS — Z1231 Encounter for screening mammogram for malignant neoplasm of breast: Secondary | ICD-10-CM | POA: Diagnosis not present

## 2021-10-23 ENCOUNTER — Telehealth: Payer: Self-pay | Admitting: Family Medicine

## 2021-10-23 DIAGNOSIS — E782 Mixed hyperlipidemia: Secondary | ICD-10-CM

## 2021-10-23 DIAGNOSIS — Z1159 Encounter for screening for other viral diseases: Secondary | ICD-10-CM

## 2021-10-23 DIAGNOSIS — E538 Deficiency of other specified B group vitamins: Secondary | ICD-10-CM

## 2021-10-23 NOTE — Telephone Encounter (Signed)
-----   Message from Ellamae Sia sent at 10/22/2021 12:23 PM EST ----- Regarding: Lab orders for Thursday, 2.23.23 Patient is scheduled for CPX labs, please order future labs, Thanks , Karna Christmas

## 2021-11-05 DIAGNOSIS — N1831 Chronic kidney disease, stage 3a: Secondary | ICD-10-CM | POA: Diagnosis not present

## 2021-11-07 NOTE — Progress Notes (Signed)
Subjective:   Natasha Sawyer is a 81 y.o. female who presents for Medicare Annual (Subsequent) preventive examination.  Review of Systems     Cardiac Risk Factors include: advanced age (>57men, >41 women);hypertension;dyslipidemia     Objective:    Today's Vitals   11/08/21 0819  Weight: 155 lb (70.3 kg)  Height: 4\' 11"  (1.499 m)   Body mass index is 31.31 kg/m.  Advanced Directives 11/08/2021 07/12/2020 06/28/2019 06/25/2018 06/16/2017 06/12/2016  Does Patient Have a Medical Advance Directive? Yes Yes Yes Yes Yes Yes  Type of 06/14/2016 of [place];Living will Healthcare Power of [place];Living will Healthcare Power of [place];Living will Healthcare Power of [place];Living will Living will;Healthcare Power of [place] Power of [place];Living will  Does patient want to make changes to medical advance directive? Yes (MAU/Ambulatory/Procedural Areas - Information given) - - - - No - Patient declined  Copy of Healthcare Power of Attorney in Chart? Yes - validated most recent copy scanned in chart (See row information) Yes - validated most recent copy scanned in chart (See row information) Yes - validated most recent copy scanned in chart (See row information) No - copy requested No - copy requested No - copy requested    Current Medications (verified) Outpatient Encounter Medications as of 11/08/2021  Medication Sig   Acetaminophen (TYLENOL ARTHRITIS PAIN PO) Take 650 mg by mouth daily.   amLODipine (NORVASC) 10 MG tablet Take 10 mg by mouth daily.   Cyanocobalamin (B-12 PO) Take by mouth daily at 8 pm.   enalapril (VASOTEC) 20 MG tablet Take 20 mg by mouth daily.   Ergocalciferol (VITAMIN D2) 2000 UNITS TABS Take 1 tablet by mouth daily.   ezetimibe-simvastatin (VYTORIN) 10-20 MG tablet TAKE 1 TABLET AT BEDTIME   propranolol (INDERAL) 10 MG tablet Take 1 tablet (10 mg total) by mouth daily as needed (palpitations).   zolpidem (AMBIEN) 5 MG tablet  Take 2.5 mg by mouth at bedtime as needed.    No facility-administered encounter medications on file as of 11/08/2021.    Allergies (verified) Patient has no known allergies.   History: Past Medical History:  Diagnosis Date   Chronic kidney disease    Hyperlipidemia    Hypertension    Premature beats    Past Surgical History:  Procedure Laterality Date   APPENDECTOMY     CESAREAN SECTION     EYE SURGERY     cataract and eyelid   HERNIA REPAIR     Family History  Problem Relation Age of Onset   Diabetes Mother    Stroke Father    Breast cancer Cousin 63       maternal   Social History   Socioeconomic History   Marital status: Widowed    Spouse name: Not on file   Number of children: Not on file   Years of education: Not on file   Highest education level: Not on file  Occupational History   Not on file  Tobacco Use   Smoking status: Never   Smokeless tobacco: Never  Vaping Use   Vaping Use: Never used  Substance and Sexual Activity   Alcohol use: No   Drug use: No   Sexual activity: Not Currently  Other Topics Concern   Not on file  Social History Narrative   [facility]ives [facility]    HAs living will, full code   Social Determinants of Health   Financial Resource Strain: Low Risk    Difficulty of  Paying Living Expenses: Not hard at all  Food Insecurity: No Food Insecurity   Worried About Running Out of Food in the Last Year: Never true   Ran Out of Food in the Last Year: Never true  Transportation Needs: No Transportation Needs   Lack of Transportation (Medical): No   Lack of Transportation (Non-Medical): No  Physical Activity: Insufficiently Active   Days of Exercise per Week: 2 days   Minutes of Exercise per Session: 50 min  Stress: No Stress Concern Present   Feeling of Stress : Not at all  Social Connections: Moderately Isolated   Frequency of Communication with Friends and Family: More than three times a week   Frequency of Social Gatherings  with Friends and Family: More than three times a week   Attends Religious Services: More than 4 times per year   Active Member of [employer]ubs or Organizations: No   Attends BankerClub or Organization Meetings: Never   Marital Status: Widowed    Tobacco Counseling Counseling given: Not Answered   Clinical Intake:  Pre-visit preparation completed: Yes  Pain : No/denies pain     BMI - recorded: 31.31 Nutritional Status: BMI > 30  Obese Nutritional Risks: None Diabetes: No  How often do you need to have someone help you when you read instructions, pamphlets, or other written materials from your doctor or pharmacy?: 1 - Never  Diabetic? No  Interpreter Needed?: No  Information entered by :: Sharen Heckamina [address] LPN   Activities of Daily Living In your present state of health, do you have any difficulty performing the following activities: 11/08/2021  Hearing? N  Vision? N  Difficulty concentrating or making decisions? N  Walking or climbing stairs? N  Dressing or bathing? N  Doing errands, shopping? N  Preparing Food and eating ? N  Using the Toilet? N  In the past six months, have you accidently leaked urine? N  Do you have problems with loss of bowel control? N  Managing your Medications? N  Managing your Finances? N  Housekeeping or managing your Housekeeping? N  Some recent data might be hidden    Patient Care Team: Excell SeltzerBedsole, Amy E, MD as PCP - General [place] OuchArida, Muhammad A, MD as PCP - Cardiology (Cardiology) Mady HaagensenLateef, Munso[place], MD as Consulting Physician (Internal Medicine) Otho KetGreen, Davina E, RN as [facility] Care Management  Indicate any recent Medical Services you may have received from other than [facility] providers in the past year (date may be approximate).     Assessment:   This is a routine wellness examination for Darl PikesSusan.  Hearing/Vision screen Hearing Screening - Comments:: No issues  Vision Screening - Comments:: Last exam over a year ago, patient plans to make  an appointment, [facility]  Dietary issues and exercise activities discussed: Current Exercise Habits: Home exercise routine, Type of exercise: walking, Time (Minutes): 45, Frequency (Times/Week): 2, Weekly Exercise (Minutes/Week): 90, Intensity: Moderate   Goals Addressed             This Visit's Progress    Patient Stated       Would like to lose weight        Depression Screen PHQ 2/9 Scores 11/08/2021 07/12/2020 07/04/2020 06/28/2019 06/25/2018 06/16/2017 06/12/2016  PHQ - 2 Score 0 0 0 0 0 0 0  PHQ- 9 Score - 0 2 0 0 0 -    Fall Risk Fall Risk  11/08/2021 07/12/2020 07/04/2020 06/28/2019 06/25/2018  Falls in the past year? 0 0 0 0  No  Comment - - - - -  Number falls in past yr: 0 0 0 - -  Injury with Fall? 0 0 0 - -  Risk for fall due to : No Fall Risks No Fall Risks - Medication side effect -  Follow up Falls prevention discussed Falls evaluation completed;Falls prevention discussed Falls evaluation completed Falls evaluation completed;Falls prevention discussed -    FALL RISK PREVENTION PERTAINING TO THE HOME:  Any stairs in or around the home? No  If so, are there any without handrails? No  Home free of loose throw rugs in walkways, pet beds, electrical cords, etc? Yes  Adequate lighting in your home to reduce risk of falls? Yes   ASSISTIVE DEVICES UTILIZED TO PREVENT FALLS:  Life alert? No  Use of a cane, walker or w/c? No  Grab bars in the bathroom? Yes  Shower chair or bench in shower? No  Elevated toilet seat or a handicapped toilet? Yes   TIMED UP AND GO:  Was the test performed? No .   Cognitive Function: Normal cognitive status assessed by  this Nurse Health Advisor. No abnormalities found.   MMSE - Mini Mental State Exam 07/12/2020 06/28/2019 06/25/2018 06/16/2017 06/12/2016  Orientation to time 5 5 5 5 5   Orientation to Place 5 5 5 5 5   Registration 3 3 3 3 3   Attention/ Calculation 5 5 0 0 0  Recall 3 3 3 3 3   Language- name 2 objects - - 0 0  0  Language- repeat 1 1 1 1 1   Language- follow 3 step command - - 3 3 3   Language- read & follow direction - - 0 0 0  Write a sentence - - 0 0 0  Copy design - - 0 0 0  Total score - - 20 20 20         Immunizations Immunization History  Administered Date(s) Administered   Fluad Quad(high Dose 65+) 06/14/2020   Influenza,inj,Quad PF,6+ Mos 06/25/2018, 07/02/2019   Influenza-Unspecified 06/16/2013   Moderna Sars-Covid-2 Vaccination 10/01/2019, 10/29/2019, 08/01/2020   Pneumococcal Conjugate-13 06/25/2018   Pneumococcal Polysaccharide-23 07/02/2019   Zoster Recombinat (Shingrix) 11/26/2019, 01/26/2020   Zoster, Live 02/13/2012    TDAP status: Due, Education has been provided regarding the importance of this vaccine. Advised may receive this vaccine at local pharmacy or Health Dept. Aware to provide a copy of the vaccination record if obtained from local pharmacy or Health Dept. Verbalized acceptance and understanding.  Flu Vaccine status: Up to date  Pneumococcal vaccine status: Up to date  Covid-19 vaccine status: Completed vaccines  Qualifies for Shingles Vaccine? Yes   Zostavax completed Yes   Shingrix Completed?: Yes  Screening Tests Health Maintenance  Topic Date Due   COVID-19 Vaccine (4 - Booster for Moderna series) 09/26/2020   TETANUS/TDAP  02/11/2021   INFLUENZA VACCINE  04/16/2021   DEXA SCAN  02/18/2022   MAMMOGRAM  09/04/2022   Pneumonia Vaccine 79+ Years old  Completed   Hepatitis C Screening  Completed   Zoster Vaccines- Shingrix  Completed   HPV VACCINES  Aged Out    Health Maintenance  Health Maintenance Due  Topic Date Due   COVID-19 Vaccine (4 - Booster for Moderna series) 09/26/2020   TETANUS/TDAP  02/11/2021   INFLUENZA VACCINE  04/16/2021    Colorectal cancer screening: No longer required.   Mammogram status: Completed 09/04/21. Repeat every year  Bone Density status: Ordered 11/08/21. Pt provided with contact info and advised to  call  to schedule appt.  Lung Cancer Screening: (Low Dose CT Chest recommended if Age 75-80 years, 30 pack-year currently smoking OR have quit w/in 15years.) does not qualify.     Additional Screening:  Hepatitis C Screening: does qualify; Completed 11/07/20  Vision Screening: Recommended annual ophthalmology exams for early detection of glaucoma and other disorders of the eye. Is the patient up to date with their annual eye exam?  No  Who is the provider or what is the name of the office in which the patient attends annual eye exams? [facility]  Dental Screening: Recommended annual dental exams for proper oral hygiene  Community Resource Referral / Chronic Care Management: CRR required this visit?  No   CCM required this visit?  No      Plan:     I have personally reviewed and noted the following in the patients chart:   Medical and social history Use of alcohol, tobacco or illicit drugs  Current medications and supplements including opioid prescriptions.  Functional ability and status Nutritional status Physical activity Advanced directives List of other physicians Hospitalizations, surgeries, and ER visits in previous 12 months Vitals Screenings to include cognitive, depression, and falls Referrals and appointments  In addition, I have reviewed and discussed with patient certain preventive protocols, quality metrics, and best practice recommendations. A written personalized care plan for preventive services as well as general preventive health recommendations were provided to patient.   Due to this being a telephonic visit, the after visit summary with patients personalized plan was offered to patient via mail or my-chart. Patient would like to access on my-chart.   Janne Lab, LPN   [id number]   Nurse Health Advisor  Nurse Notes: none

## 2021-11-08 ENCOUNTER — Other Ambulatory Visit: Payer: Self-pay

## 2021-11-08 ENCOUNTER — Other Ambulatory Visit: Payer: Self-pay | Admitting: Family Medicine

## 2021-11-08 ENCOUNTER — Ambulatory Visit (INDEPENDENT_AMBULATORY_CARE_PROVIDER_SITE_OTHER): Payer: Medicare PPO

## 2021-11-08 ENCOUNTER — Other Ambulatory Visit (INDEPENDENT_AMBULATORY_CARE_PROVIDER_SITE_OTHER): Payer: Medicare PPO

## 2021-11-08 VITALS — Ht 59.0 in | Wt 155.0 lb

## 2021-11-08 DIAGNOSIS — Z780 Asymptomatic menopausal state: Secondary | ICD-10-CM | POA: Diagnosis not present

## 2021-11-08 DIAGNOSIS — E538 Deficiency of other specified B group vitamins: Secondary | ICD-10-CM

## 2021-11-08 DIAGNOSIS — Z1159 Encounter for screening for other viral diseases: Secondary | ICD-10-CM

## 2021-11-08 DIAGNOSIS — Z0000 Encounter for general adult medical examination without abnormal findings: Secondary | ICD-10-CM | POA: Diagnosis not present

## 2021-11-08 DIAGNOSIS — E782 Mixed hyperlipidemia: Secondary | ICD-10-CM | POA: Diagnosis not present

## 2021-11-08 LAB — LIPID PANEL
Cholesterol: 158 mg/dL (ref 0–200)
HDL: 76.2 mg/dL (ref >39.00)
LDL Cholesterol: 58 mg/dL (ref 0–99)
NonHDL: 81.97
Total CHOL/HDL Ratio: 2
Triglycerides: 121 mg/dL (ref 0.0–149.0)
VLDL: 24.2 mg/dL (ref 0.0–40.0)

## 2021-11-08 LAB — COMPREHENSIVE METABOLIC PANEL
ALT: 8 U/L (ref 0–35)
AST: 12 U/L (ref 0–37)
Albumin: 3.9 g/dL (ref 3.5–5.2)
Alkaline Phosphatase: 64 U/L (ref 39–117)
BUN: 23 mg/dL (ref 6–23)
CO2: 29 mEq/L (ref 19–32)
Calcium: 9.6 mg/dL (ref 8.4–10.5)
Chloride: 107 mEq/L (ref 96–112)
Creatinine, Ser: 1.15 mg/dL (ref 0.40–1.20)
GFR: 45.49 mL/min — ABNORMAL LOW (ref >60.00)
Glucose, Bld: 82 mg/dL (ref 70–99)
Potassium: 4.8 mEq/L (ref 3.5–5.1)
Sodium: 141 mEq/L (ref 135–145)
Total Bilirubin: 0.3 mg/dL (ref 0.2–1.2)
Total Protein: 6.7 g/dL (ref 6.0–8.3)

## 2021-11-08 LAB — VITAMIN B12: Vitamin B-12: 1339 pg/mL — ABNORMAL HIGH (ref 211–911)

## 2021-11-08 NOTE — Patient Instructions (Signed)
Natasha Sawyer , Thank you for taking time to complete your Medicare Wellness Visit. I appreciate your ongoing commitment to your health goals. Please review the following plan we discussed and let me know if I can assist you in the future.   Screening recommendations/referrals: Colonoscopy: no longer required  Mammogram: up to date, completed 09/04/21, due 09/04/21 Bone Density: due, last completed 02/18/17, ordered today someone will call to schedule an appointment Recommended yearly ophthalmology/optometry visit for glaucoma screening and checkup Recommended yearly dental visit for hygiene and checkup  Vaccinations: Influenza vaccine: up to date, please provide updated vaccine information, when available   Pneumococcal vaccine: up to date  Tdap vaccine: due, last completed 02/02/11, medicare may cover in the event that you are injured Shingles vaccine: up to date    Covid-19:up to date, please provide updated vaccine information , when available   Advanced directives: copy in chart   Conditions/risks identified: see problem list   Next appointment: Follow up in one year for your annual wellness visit    Preventive Care 65 Years and Older, Female Preventive care refers to lifestyle choices and visits with your health care provider that can promote health and wellness. What does preventive care include? A yearly physical exam. This is also called an annual well check. Dental exams once or twice a year. Routine eye exams. Ask your health care provider how often you should have your eyes checked. Personal lifestyle choices, including: Daily care of your teeth and gums. Regular physical activity. Eating a healthy diet. Avoiding tobacco and drug use. Limiting alcohol use. Practicing safe sex. Taking low-dose aspirin every day. Taking vitamin and mineral supplements as recommended by your health care provider. What happens during an annual well check? The services and screenings done by  your health care provider during your annual well check will depend on your age, overall health, lifestyle risk factors, and family history of disease. Counseling  Your health care provider may ask you questions about your: Alcohol use. Tobacco use. Drug use. Emotional well-being. Home and relationship well-being. Sexual activity. Eating habits. History of falls. Memory and ability to understand (cognition). Work and work Astronomer. Reproductive health. Screening  You may have the following tests or measurements: Height, weight, and BMI. Blood pressure. Lipid and cholesterol levels. These may be checked every 5 years, or more frequently if you are over 50 years old. Skin check. Lung cancer screening. You may have this screening every year starting at age 46 if you have a 30-pack-year history of smoking and currently smoke or have quit within the past 15 years. Fecal occult blood test (FOBT) of the stool. You may have this test every year starting at age 84. Flexible sigmoidoscopy or colonoscopy. You may have a sigmoidoscopy every 5 years or a colonoscopy every 10 years starting at age 74. Hepatitis C blood test. Hepatitis B blood test. Sexually transmitted disease (STD) testing. Diabetes screening. This is done by checking your blood sugar (glucose) after you have not eaten for a while (fasting). You may have this done every 1-3 years. Bone density scan. This is done to screen for osteoporosis. You may have this done starting at age 31. Mammogram. This may be done every 1-2 years. Talk to your health care provider about how often you should have regular mammograms. Talk with your health care provider about your test results, treatment options, and if necessary, the need for more tests. Vaccines  Your health care provider may recommend certain vaccines, such as: Influenza  vaccine. This is recommended every year. Tetanus, diphtheria, and acellular pertussis (Tdap, Td) vaccine. You  may need a Td booster every 10 years. Zoster vaccine. You may need this after age 103. Pneumococcal 13-valent conjugate (PCV13) vaccine. One dose is recommended after age 71. Pneumococcal polysaccharide (PPSV23) vaccine. One dose is recommended after age 80. Talk to your health care provider about which screenings and vaccines you need and how often you need them. This information is not intended to replace advice given to you by your health care provider. Make sure you discuss any questions you have with your health care provider. Document Released: 09/29/2015 Document Revised: 05/22/2016 Document Reviewed: 07/04/2015 Elsevier Interactive Patient Education  2017 [employer].  Fall Prevention in the Home Falls can cause injuries. They can happen to people of all ages. There are many things you can do to make your home safe and to help prevent falls. What can I do on the outside of my home? Regularly fix the edges of walkways and driveways and fix any cracks. Remove anything that might make you trip as you walk through a door, such as a raised step or threshold. Trim any bushes or trees on the path to your home. Use bright outdoor lighting. Clear any walking paths of anything that might make someone trip, such as rocks or tools. Regularly check to see if handrails are loose or broken. Make sure that both sides of any steps have handrails. Any raised decks and porches should have guardrails on the edges. Have any leaves, snow, or ice cleared regularly. Use sand or salt on walking paths during winter. Clean up any spills in your garage right away. This includes oil or grease spills. What can I do in the bathroom? Use night lights. Install grab bars by the toilet and in the tub and shower. Do not use towel bars as grab bars. Use non-skid mats or decals in the tub or shower. If you need to sit down in the shower, use a plastic, non-slip stool. Keep the floor dry. Clean up any water that spills  on the floor as soon as it happens. Remove soap buildup in the tub or shower regularly. Attach bath mats securely with double-sided non-slip rug tape. Do not have throw rugs and other things on the floor that can make you trip. What can I do in the bedroom? Use night lights. Make sure that you have a light by your bed that is easy to reach. Do not use any sheets or blankets that are too big for your bed. They should not hang down onto the floor. Have a firm chair that has side arms. You can use this for support while you get dressed. Do not have throw rugs and other things on the floor that can make you trip. What can I do in the kitchen? Clean up any spills right away. Avoid walking on wet floors. Keep items that you use a lot in easy-to-reach places. If you need to reach something above you, use a strong step stool that has a grab bar. Keep electrical cords out of the way. Do not use floor polish or wax that makes floors slippery. If you must use wax, use non-skid floor wax. Do not have throw rugs and other things on the floor that can make you trip. What can I do with my stairs? Do not leave any items on the stairs. Make sure that there are handrails on both sides of the stairs and use them. Fix  handrails that are broken or loose. Make sure that handrails are as long as the stairways. Check any carpeting to make sure that it is firmly attached to the stairs. Fix any carpet that is loose or worn. Avoid having throw rugs at the top or bottom of the stairs. If you do have throw rugs, attach them to the floor with carpet tape. Make sure that you have a light switch at the top of the stairs and the bottom of the stairs. If you do not have them, ask someone to add them for you. What else can I do to help prevent falls? Wear shoes that: Do not have high heels. Have rubber bottoms. Are comfortable and fit you well. Are closed at the toe. Do not wear sandals. If you use a stepladder: Make  sure that it is fully opened. Do not climb a closed stepladder. Make sure that both sides of the stepladder are locked into place. Ask someone to hold it for you, if possible. Clearly mark and make sure that you can see: Any grab bars or handrails. First and last steps. Where the edge of each step is. Use tools that help you move around (mobility aids) if they are needed. These include: Canes. Walkers. Scooters. Crutches. Turn on the lights when you go into a dark area. Replace any light bulbs as soon as they burn out. Set up your furniture so you have a clear path. Avoid moving your furniture around. If any of your floors are uneven, fix them. If there are any pets around you, be aware of where they are. Review your medicines with your doctor. Some medicines can make you feel dizzy. This can increase your chance of falling. Ask your doctor what other things that you can do to help prevent falls. This information is not intended to replace advice given to you by your health care provider. Make sure you discuss any questions you have with your health care provider. Document Released: 06/29/2009 Document Revised: 02/08/2016 Document Reviewed: 10/07/2014 Elsevier Interactive Patient Education  2017 [employer].

## 2021-11-09 LAB — HEPATITIS C ANTIBODY
Hepatitis C Ab: NONREACTIVE
SIGNAL TO CUT-OFF: <0.02 (ref <1.00)

## 2021-11-09 NOTE — Progress Notes (Signed)
No critical labs need to be addressed urgently. We will discuss labs in detail at upcoming office visit.   

## 2021-11-12 DIAGNOSIS — N2581 Secondary hyperparathyroidism of renal origin: Secondary | ICD-10-CM | POA: Diagnosis not present

## 2021-11-12 DIAGNOSIS — I10 Essential (primary) hypertension: Secondary | ICD-10-CM | POA: Diagnosis not present

## 2021-11-12 DIAGNOSIS — N1831 Chronic kidney disease, stage 3a: Secondary | ICD-10-CM | POA: Diagnosis not present

## 2021-11-12 DIAGNOSIS — R809 Proteinuria, unspecified: Secondary | ICD-10-CM | POA: Diagnosis not present

## 2021-11-16 ENCOUNTER — Ambulatory Visit (INDEPENDENT_AMBULATORY_CARE_PROVIDER_SITE_OTHER): Payer: Medicare PPO | Admitting: Family Medicine

## 2021-11-16 ENCOUNTER — Encounter: Payer: Self-pay | Admitting: Family Medicine

## 2021-11-16 ENCOUNTER — Other Ambulatory Visit: Payer: Self-pay

## 2021-11-16 VITALS — BP 106/60 | HR 56 | Temp 98.2°F | Ht 59.25 in | Wt 156.5 lb

## 2021-11-16 DIAGNOSIS — Z0000 Encounter for general adult medical examination without abnormal findings: Secondary | ICD-10-CM

## 2021-11-16 DIAGNOSIS — I10 Essential (primary) hypertension: Secondary | ICD-10-CM | POA: Diagnosis not present

## 2021-11-16 DIAGNOSIS — E538 Deficiency of other specified B group vitamins: Secondary | ICD-10-CM | POA: Diagnosis not present

## 2021-11-16 DIAGNOSIS — E782 Mixed hyperlipidemia: Secondary | ICD-10-CM | POA: Diagnosis not present

## 2021-11-16 DIAGNOSIS — N1832 Chronic kidney disease, stage 3b: Secondary | ICD-10-CM

## 2021-11-16 DIAGNOSIS — I471 Supraventricular tachycardia: Secondary | ICD-10-CM

## 2021-11-16 NOTE — Assessment & Plan Note (Signed)
Chronic ? ?LDL at goal on Vytorin 10/20 mg daily ?

## 2021-11-16 NOTE — Patient Instructions (Signed)
Please call the location of your choice from the menu below to schedule your Mammogram and/or Bone Density appointment.  ON OR AFTER 09/04/22 ? ? ? ?[place] ? ?[facility]   ?Phone:  [phone]   ?[address]                                                                            ?[place], [place] [address]                                            ?Services: 3D Mammogram and Bone Density ? ?[facility] at [facility])  ?Phone:  [phone]   ?[address]                        ?[place], [place] [address]                                              ?Services:  3D Mammogram and Bone Density ? ?

## 2021-11-16 NOTE — Assessment & Plan Note (Signed)
Stable, chronic.  Continue current medication. ? ? ?amlodipine 10 mg daily, inderal 10 mg daily, enalapril 20 mg daily. ?

## 2021-11-16 NOTE — Assessment & Plan Note (Signed)
Chronic ?Stable on propranolol,  followed by Cardiology. ?

## 2021-11-16 NOTE — Assessment & Plan Note (Signed)
Chronic, stable ? Followed by Dr. Cherylann Ratel. ?

## 2021-11-16 NOTE — Progress Notes (Signed)
? ? Patient ID: Natasha Sawyer, female    DOB: [birth year], 81 y.o.   MRN: [id number] ? ?This visit was conducted in person. ? ?BP 106/60   Pulse (!) 56   Temp 98.2 ?F (36.8 ?C) (Oral)   Ht 4' 11.25" (1.505 m)   Wt 156 lb 8 oz (71 kg)   SpO2 97%   BMI 31.34 kg/m?   ? ?CC: ?Chief Complaint  ?Patient presents with  ? Annual Exam  ?  Part 2  ? ? ?Subjective:  ? ?HPI: ?Natasha Sawyer is a 81 y.o. female presenting on 11/16/2021 for Annual Exam (Part 2) ? ?The patient presents for  complete physical and review of chronic health problems. He/She also has the following acute concerns today: ? ? ?The patient saw a LPN or RN for medicare wellness visit. ? Prevention and wellness was reviewed in detail. ?Note reviewed and important notes copied below. ? ?11/16/21 ? ?Hypertension:  Well controlled on amlodipine 10 mg daily, inderal 10 mg daily, enalapril 20 mg daily. ?BP Readings from Last 3 Encounters:  ?11/16/21 106/60  ?03/13/21 118/70  ?11/14/20 114/70  ?Using medication without problems or lightheadedness:  none ?Chest pain with exertion: none ?Edema: intermittent ?Short of breath:  some SOB with exertion for a while. ?Average home BPs: ?Other issues:  ? ? CKD stage 3  and secondary hyperparathyroid followed by Dr. Ofilia Neas Creatinine Clearance: 34.8 mL/min (by C-G formula based on SCr of 1.15 mg/dL). ?On ACEI, BP controlled. ? ?B12 deficiency:At goal on  oral replacement ? ?Elevated Cholesterol:LDL at goal on Vytorin 10/20 mg daily ?Lab Results  ?Component Value Date  ? CHOL 158 11/08/2021  ? HDL 76.20 11/08/2021  ? [place] 58 11/08/2021  ? TRIG 121.0 11/08/2021  ? CHOLHDL 2 11/08/2021  ?Using medications without problems: ?Muscle aches:  none ?Diet compliance: moderate ?Exercise: walking  daily to every other day.  ?Other complaints: ? ?  PSVT, stable on inderal prn, amlodipine Followed by Christell Faith PA in past. ? One episode 3 weeks ago... HR 104 lasted 10 min... did not have ? No CP, no  lightheadedness ? ?Relevant past medical, surgical, family and social history reviewed and updated as indicated. Interim medical history since our last visit reviewed. ?Allergies and medications reviewed and updated. ?Outpatient Medications Prior to Visit  ?Medication Sig Dispense Refill  ? Acetaminophen (TYLENOL ARTHRITIS PAIN PO) Take 650 mg by mouth daily.    ? amLODipine (NORVASC) 10 MG tablet Take 10 mg by mouth daily.    ? Cyanocobalamin (B-12 PO) Take by mouth daily at 8 pm.    ? enalapril (VASOTEC) 20 MG tablet Take 20 mg by mouth daily.    ? Ergocalciferol (VITAMIN D2) 2000 UNITS TABS Take 1 tablet by mouth daily.    ? ezetimibe-simvastatin (VYTORIN) 10-20 MG tablet TAKE 1 TABLET AT BEDTIME 90 tablet 0  ? propranolol (INDERAL) 10 MG tablet Take 1 tablet (10 mg total) by mouth daily as needed (palpitations). 12 tablet 0  ? zolpidem (AMBIEN) 5 MG tablet Take 2.5 mg by mouth at bedtime as needed.     ? ?No facility-administered medications prior to visit.  ?  ? ?Per HPI unless specifically indicated in ROS section below ?Review of Systems ?Objective:  ?BP 106/60   Pulse (!) 56   Temp 98.2 ?F (36.8 ?C) (Oral)   Ht 4' 11.25" (1.505 m)   Wt 156 lb 8 oz (71 kg)   SpO2 97%   BMI 31.34 kg/m?   ?  Wt Readings from Last 3 Encounters:  ?11/16/21 156 lb 8 oz (71 kg)  ?11/08/21 155 lb (70.3 kg)  ?03/13/21 157 lb (71.2 kg)  ?  ?  ?Physical Exam ?   ?Results for orders placed or performed in visit on 11/08/21  ?Hepatitis C antibody  ?Result Value Ref Range  ? Hepatitis C Ab NON-REACTIVE NON-REACTIVE  ? SIGNAL TO CUT-OFF <0.02 <1.00  ?Vitamin B12  ?Result Value Ref Range  ? Vitamin B-12 1,339 (H) 211 - 911 pg/mL  ?Comprehensive metabolic panel  ?Result Value Ref Range  ? Sodium 141 135 - 145 mEq/L  ? Potassium 4.8 3.5 - 5.1 mEq/L  ? Chloride 107 96 - 112 mEq/L  ? CO2 29 19 - 32 mEq/L  ? Glucose, Bld 82 70 - 99 mg/dL  ? BUN 23 6 - 23 mg/dL  ? Creatinine, Ser 1.15 0.40 - 1.20 mg/dL  ? Total Bilirubin 0.3 0.2 - 1.2 mg/dL   ? Alkaline Phosphatase 64 39 - 117 U/L  ? AST 12 0 - 37 U/L  ? ALT 8 0 - 35 U/L  ? Total Protein 6.7 6.0 - 8.3 g/dL  ? Albumin 3.9 3.5 - 5.2 g/dL  ? GFR 45.49 (L) >60.00 mL/min  ? Calcium 9.6 8.4 - 10.5 mg/dL  ?Lipid panel  ?Result Value Ref Range  ? Cholesterol 158 0 - 200 mg/dL  ? Triglycerides 121.0 0.0 - 149.0 mg/dL  ? HDL 76.20 >39.00 mg/dL  ? VLDL 24.2 0.0 - 40.0 mg/dL  ? LDL Cholesterol 58 0 - 99 mg/dL  ? Total CHOL/HDL Ratio 2   ? NonHDL 81.97   ? ? ?This visit occurred during the SARS-CoV-2 public health emergency.  Safety protocols were in place, including screening questions prior to the visit, additional usage of staff PPE, and extensive cleaning of exam room while observing appropriate contact time as indicated for disinfecting solutions.  ? ?COVID 19 screen:  No recent travel or known exposure to [place] ?The patient denies respiratory symptoms of COVID 19 at this time. ?The importance of social distancing was discussed today.  ? ?Assessment and Plan ?The patient's preventative maintenance and recommended screening tests for an annual wellness exam were reviewed in full today. ?Brought up to date unless services declined. ? ?Counselled on the importance of diet, exercise, and its role in overall health and mortality. ?The patient's FH and SH was reviewed, including their home life, tobacco status, and drug and alcohol status.  ? ?Mammo:08/2021 normal ? PAP/DVE:  last pap 2012 nml, okay with stopping these., asymptomatic, no family history of ovarian or uterine cancer: ?Colon: last done many years ago 2006 : nml cologuard. No further needed at this time. ?Nonsmoker ?Vaccines: COVID , flu uptodate,S/P shingrix and postpone tetanus ?Bone density - 02/2017 ostopenia, repeat in 2023... ordered ? Hep C: done ?Medicare Wellness Visit with nurse, followed by  40 minute CPE with Dr. Diona Browner ? ?Problem List Items Addressed This Visit   ? ? CKD (chronic kidney disease) stage 3, GFR 30-59 ml/min (HCC) (Chronic)  ?   Chronic, stable ? Followed by Dr. Holley Raring. ?  ?  ? Essential hypertension, benign (Chronic)  ?  Stable, chronic.  Continue current medication. ? ? ?amlodipine 10 mg daily, inderal 10 mg daily, enalapril 20 mg daily. ?  ?  ? Hyperlipidemia (Chronic)  ?  Chronic ? ?LDL at goal on Vytorin 10/20 mg daily ?  ?  ? PSVT (paroxysmal supraventricular tachycardia) (HCC) (Chronic)  ?  Chronic ?  Stable on propranolol,  followed by Cardiology. ?  ?  ? Vitamin B 12 deficiency  ?  Chronic ? ?At goal on replacement ?  ?  ? ?Other Visit Diagnoses   ? ? Routine general medical examination at a health care facility    -  Primary  ? ?  ? ? ?Eliezer Lofts, MD  ? ?

## 2021-11-16 NOTE — Assessment & Plan Note (Signed)
Chronic ? ?At goal on replacement ?

## 2021-12-10 ENCOUNTER — Other Ambulatory Visit: Payer: Self-pay

## 2021-12-10 ENCOUNTER — Emergency Department: Payer: Medicare PPO

## 2021-12-10 ENCOUNTER — Emergency Department
Admission: EM | Admit: 2021-12-10 | Discharge: 2021-12-10 | Disposition: A | Discharge status: Home / Self Care | Payer: Medicare PPO | Attending: Emergency Medicine | Admitting: Emergency Medicine

## 2021-12-10 DIAGNOSIS — S4292XA Fracture of left shoulder girdle, part unspecified, initial encounter for closed fracture: Secondary | ICD-10-CM | POA: Diagnosis not present

## 2021-12-10 DIAGNOSIS — R58 Hemorrhage, not elsewhere classified: Secondary | ICD-10-CM | POA: Diagnosis not present

## 2021-12-10 DIAGNOSIS — N189 Chronic kidney disease, unspecified: Secondary | ICD-10-CM | POA: Insufficient documentation

## 2021-12-10 DIAGNOSIS — R220 Localized swelling, mass and lump, head: Secondary | ICD-10-CM | POA: Diagnosis not present

## 2021-12-10 DIAGNOSIS — S42292A Other displaced fracture of upper end of left humerus, initial encounter for closed fracture: Secondary | ICD-10-CM | POA: Diagnosis not present

## 2021-12-10 DIAGNOSIS — R42 Dizziness and giddiness: Secondary | ICD-10-CM | POA: Diagnosis not present

## 2021-12-10 DIAGNOSIS — W01198A Fall on same level from slipping, tripping and stumbling with subsequent striking against other object, initial encounter: Secondary | ICD-10-CM | POA: Diagnosis not present

## 2021-12-10 DIAGNOSIS — R531 Weakness: Secondary | ICD-10-CM | POA: Diagnosis not present

## 2021-12-10 DIAGNOSIS — I129 Hypertensive chronic kidney disease with stage 1 through stage 4 chronic kidney disease, or unspecified chronic kidney disease: Secondary | ICD-10-CM | POA: Insufficient documentation

## 2021-12-10 DIAGNOSIS — W19XXXA Unspecified fall, initial encounter: Secondary | ICD-10-CM | POA: Diagnosis not present

## 2021-12-10 DIAGNOSIS — S0081XA Abrasion of other part of head, initial encounter: Secondary | ICD-10-CM

## 2021-12-10 DIAGNOSIS — S0990XA Unspecified injury of head, initial encounter: Secondary | ICD-10-CM | POA: Diagnosis not present

## 2021-12-10 DIAGNOSIS — M7989 Other specified soft tissue disorders: Secondary | ICD-10-CM | POA: Diagnosis not present

## 2021-12-10 DIAGNOSIS — Z043 Encounter for examination and observation following other accident: Secondary | ICD-10-CM | POA: Diagnosis not present

## 2021-12-10 DIAGNOSIS — S4992XA Unspecified injury of left shoulder and upper arm, initial encounter: Secondary | ICD-10-CM | POA: Diagnosis present

## 2021-12-10 DIAGNOSIS — M25519 Pain in unspecified shoulder: Secondary | ICD-10-CM | POA: Diagnosis not present

## 2021-12-10 DIAGNOSIS — S43015A Anterior dislocation of left humerus, initial encounter: Secondary | ICD-10-CM | POA: Diagnosis not present

## 2021-12-10 DIAGNOSIS — S199XXA Unspecified injury of neck, initial encounter: Secondary | ICD-10-CM | POA: Diagnosis not present

## 2021-12-10 MED ORDER — FENTANYL CITRATE PF 50 MCG/ML IJ SOSY
25.0000 ug | PREFILLED_SYRINGE | Freq: Once | INTRAMUSCULAR | Status: AC
  Administered 2021-12-10: 25 ug via INTRAVENOUS
  Filled 2021-12-10: qty 1

## 2021-12-10 MED ORDER — PROPOFOL 10 MG/ML IV BOLUS
1.0000 mg/kg | Freq: Once | INTRAVENOUS | Status: AC
  Administered 2021-12-10: 71 mg via INTRAVENOUS
  Filled 2021-12-10: qty 20

## 2021-12-10 MED ORDER — SODIUM CHLORIDE 0.9 % IV SOLN
INTRAVENOUS | Status: AC | PRN
  Administered 2021-12-10: 50 mL/h via INTRAVENOUS

## 2021-12-10 MED ORDER — FENTANYL CITRATE PF 50 MCG/ML IJ SOSY
50.0000 ug | PREFILLED_SYRINGE | Freq: Once | INTRAMUSCULAR | Status: AC
  Administered 2021-12-10: 50 ug via INTRAVENOUS
  Filled 2021-12-10: qty 1

## 2021-12-10 MED ORDER — OXYCODONE-ACETAMINOPHEN 5-325 MG PO TABS: 1.0000 | ORAL_TABLET | Freq: Three times a day (TID) | ORAL | 0 refills | Status: AC | PRN

## 2021-12-10 MED ORDER — ACETAMINOPHEN 500 MG PO TABS
1000.0000 mg | ORAL_TABLET | Freq: Once | ORAL | Status: AC
  Administered 2021-12-10: 1000 mg via ORAL
  Filled 2021-12-10: qty 2

## 2021-12-10 MED ORDER — PROPOFOL 10 MG/ML IV BOLUS
INTRAVENOUS | Status: AC | PRN
Start: 2021-12-10 — End: 2021-12-10
  Administered 2021-12-10 (×2): 35.5 mg via INTRAVENOUS

## 2021-12-10 NOTE — ED Notes (Signed)
See triage note  presents with left shoulder pain  s/p fall   positive deformity noted  good pulses  also had laceration over left eye ?

## 2021-12-10 NOTE — ED Provider Notes (Signed)
? ?[facility] ?Provider Note ? ? ? Event Date/Time  ? First MD Initiated Contact with Patient 12/10/21 1356   ?  (approximate) ? ? ?History  ? ?Fall ? ? ?HPI ? ?Natasha Sawyer is a 81 y.o. female with past medical history of HTN, HDL, PVCs and CKD who presents accompanied by family for evaluation of left shoulder pain after a fall.  Patient that she is tripped shortly prior to arrival.  She did hit her head.  She denies being on any blood thinners.  Denies any other pain at this time other than the left shoulder.  No other recent falls or injuries.  No other recent sick symptoms including fevers, chills, cough, nausea, vomiting, diarrhea, rash or any other acute concerns. ? ?  ?Past Medical History:  ?Diagnosis Date  ? Chronic kidney disease   ? Hyperlipidemia   ? Hypertension   ? Premature beats   ? ? ? ?Physical Exam  ?Triage Vital Signs: ?ED Triage Vitals  ?Enc Vitals Group  ?   BP 12/10/21 1255 137/69  ?   Pulse Rate 12/10/21 1255 95  ?   Resp 12/10/21 1255 17  ?   Temp 12/10/21 1255 97.6 ?F (36.4 ?C)  ?   Temp Source 12/10/21 1255 Oral  ?   SpO2 12/10/21 1255 95 %  ?   Weight 12/10/21 1351 156 lb 8.4 oz (71 kg)  ?   Height 12/10/21 1256 4\' 11"  (1.499 m)  ?   Head Circumference --   ?   Peak Flow --   ?   Pain Score 12/10/21 1256 10  ?   Pain Loc --   ?   Pain Edu? --   ?   Excl. in GC? --   ? ? ?Most recent vital signs: ?Vitals:  ? 12/10/21 1618 12/10/21 1619  ?BP:    ?Pulse: 98 (!) 108  ?Resp: (!) 23 (!) 22  ?Temp:    ?SpO2: 97% 94%  ? ? ?General: Awake, appears very uncomfortable. ?CV:  Good peripheral perfusion.  2+ radial pulses. ?Resp:  Normal effort.  ?Abd:  No distention.  Soft throughout. ?Other:  Deformity and decreased range of motion of the left shoulder.  Patient is very hesitant to range her left elbow on initial exam secondary to pain in the shoulder.  Sensation is intact in the distribution of the radial ulnar and median nerves in the left upper extremity.  She otherwise has  full strength range of motion throughout the right upper extremity and bilateral lower extremities.  There is a small abrasion of the left eyebrow.  No other obvious trauma to the face scalp head or neck.  No tenderness along the C/T/L-spine. ? ? ?ED Results / Procedures / Treatments  ?Labs ?(all labs ordered are listed, but only abnormal results are displayed) ?Labs Reviewed - No data to display ? ? ?EKG ? ? ? ?RADIOLOGY ?Plain film the left shoulder obtained in triage interpreted myself shows anterior location with fracture fragment of unclear origin.  Also reviewed radiologist interpretation and agree with their findings of same. ? ?CT head, face and C-spine interpreted by myself without evidence of skull fracture, intracranial hemorrhage or acute C-spine fracture.  I also reviewed radiology interpretation and agree with the findings of same in addition to notation of t evidence of some left periorbital tissue swelling and multilevel spondylosis and facet hypertrophy.   ? ?CT left shoulder on my interpretation shows anterior dislocation and what  appears to be a bone fragment likely from the humeral head.  I also reviewed radiologist interpretation and agree with their findings of same.  There is some lipohemarthrosis as well. ? ? ?PROCEDURES: ? ?Critical Care performed: No ? ?Reduction of dislocation ? ?Date/Time: 12/10/2021 3:44 PM ?Performed by: Gilles ChiquitoSmith, Zemirah Krasinski P, MD ?Authorized by: Gilles ChiquitoSmith, Yoshika Vensel P, MD  ?Consent: Verbal consent obtained. Written consent obtained. ?Risks and benefits: risks, benefits and alternatives were discussed ?Consent given by: patient ?Patient understanding: patient states understanding of the procedure being performed ?Patient identity confirmed: arm band and provided demographic data ?Time out: Immediately prior to procedure a "time out" was called to verify the correct patient, procedure, equipment, support staff and site/side marked as required. ?Local anesthesia used:  no ? ?Anesthesia: ?Local anesthesia used: no ? ?Sedation: ?Patient sedated: yes ?Sedatives: propofol ?Analgesia: fentanyl ?Vitals: Vital signs were monitored during sedation. ? ?Patient tolerance: patient tolerated the procedure well with no immediate complications ? ? ?Marland Kitchen.Sedation ? ?Date/Time: 12/10/2021 3:44 PM ?Performed by: Gilles ChiquitoSmith, Daielle Melcher P, MD ?Authorized by: Gilles ChiquitoSmith, Deborha Moseley P, MD  ? ?Consent:  ?  Consent obtained:  Verbal and written ?  Consent given by:  Patient ?  Risks discussed:  Allergic reaction, dysrhythmia, inadequate sedation, nausea, vomiting, respiratory compromise necessitating ventilatory assistance and intubation, prolonged sedation necessitating reversal and prolonged hypoxia resulting in organ damage ?  Alternatives discussed:  Analgesia without sedation ?Universal protocol:  ?  Procedure explained and questions answered to patient or proxy's satisfaction: yes   ?  Immediately prior to procedure, a time out was called: yes   ?  Patient identity confirmed:  Arm band ?Indications:  ?  Procedure performed:  Dislocation reduction ?  Procedure necessitating sedation performed by:  Physician performing sedation ?Pre-sedation assessment:  ?  Time since last food or drink:  N/a ?  NPO status caution: unable to specify NPO status   ?  ASA classification: class 2 - patient with mild systemic disease   ?  Mouth opening:  3 or more finger widths ?  Thyromental distance:  3 finger widths ?  Mallampati score:  I - soft palate, uvula, fauces, pillars visible ?  Neck mobility: normal   ?  Pre-sedation assessments completed and reviewed: airway patency, cardiovascular function, hydration status, mental status, nausea/vomiting, pain level, respiratory function and temperature   ?Immediate pre-procedure details:  ?  Reviewed: vital signs, relevant labs/tests and NPO status   ?  Verified: bag valve mask available, emergency equipment available, intubation equipment available, IV patency confirmed, oxygen available,  reversal medications available and suction available   ?Procedure details (see MAR for exact dosages):  ?  Preoxygenation:  Nasal cannula ?  Sedation:  Propofol ?  Intended level of sedation: deep ?  Analgesia:  Fentanyl ?  Intra-procedure monitoring:  Blood pressure monitoring, cardiac monitor, continuous pulse oximetry, continuous capnometry, frequent LOC assessments and frequent vital sign checks ?  Total Provider sedation time (minutes):  15 ?Post-procedure details:  ?  Attendance: Constant attendance by certified staff until patient recovered   ?  Recovery: Patient returned to pre-procedure baseline   ?  Post-sedation assessments completed and reviewed: airway patency, cardiovascular function, hydration status, mental status, nausea/vomiting, pain level, respiratory function and temperature   ?  Patient is stable for discharge or admission: no   ?  Procedure completion:  Tolerated well, no immediate complications ? ? ? ?MEDICATIONS ORDERED IN ED: ?Medications  ?acetaminophen (TYLENOL) tablet 1,000 mg (has no administration in time range)  ?  fentaNYL (SUBLIMAZE) injection 50 mcg (50 mcg Intravenous Given 12/10/21 1414)  ?propofol (DIPRIVAN) 10 mg/mL bolus/IV push 71 mg (71 mg Intravenous Given 12/10/21 1550)  ?fentaNYL (SUBLIMAZE) injection 25 mcg (25 mcg Intravenous Given 12/10/21 1549)  ?propofol (DIPRIVAN) 10 mg/mL bolus/IV push (35.5 mg Intravenous Given 12/10/21 1557)  ?0.9 %  sodium chloride infusion (0 mLs Intravenous Stopped 12/10/21 1618)  ? ? ? ?IMPRESSION / MDM / ASSESSMENT AND PLAN / ED COURSE  ?I reviewed the triage vital signs and the nursing notes. ?             ?               ? ?Differential diagnosis includes, but is not limited to superficial abrasion over the left eyebrow versus possible underlying fracture and given age possible occult C-spine injury not evident on exam as well as concern for fracture dislocation at the left shoulder.  She is otherwise neurovascular intact and I have a low  suspicion for other significant visceral or orthopedic injury at this time.  Very low suspicion for syncope precipitating fall. ? ?CT head, face and C-spine interpreted by myself without evidence of skull fracture, intracra

## 2021-12-10 NOTE — ED Triage Notes (Signed)
Patient to ER via [facility] from home with complaints of mechanical fall this afternoon. No LOC/ no blood thinners.  ?  ?Lac present to left eyebrow. Complaints of left scapula pain.  ? ?146/86, 98% RA, NSR on 12 lead. Cbg 158. ? ?Hx htn/ afib. ?

## 2021-12-14 ENCOUNTER — Other Ambulatory Visit: Payer: Self-pay | Admitting: Orthopedic Surgery

## 2021-12-14 ENCOUNTER — Ambulatory Visit
Admission: RE | Admit: 2021-12-14 | Discharge: 2021-12-14 | Disposition: A | Discharge status: Home / Self Care | Payer: Medicare PPO | Source: Ambulatory Visit | Attending: Orthopedic Surgery | Admitting: Orthopedic Surgery

## 2021-12-14 DIAGNOSIS — S4292XA Fracture of left shoulder girdle, part unspecified, initial encounter for closed fracture: Secondary | ICD-10-CM | POA: Insufficient documentation

## 2021-12-14 DIAGNOSIS — S42202A Unspecified fracture of upper end of left humerus, initial encounter for closed fracture: Secondary | ICD-10-CM | POA: Diagnosis not present

## 2021-12-14 DIAGNOSIS — M7989 Other specified soft tissue disorders: Secondary | ICD-10-CM | POA: Diagnosis not present

## 2021-12-17 ENCOUNTER — Other Ambulatory Visit: Payer: Self-pay | Admitting: Orthopedic Surgery

## 2021-12-18 DIAGNOSIS — S4292XA Fracture of left shoulder girdle, part unspecified, initial encounter for closed fracture: Secondary | ICD-10-CM | POA: Diagnosis not present

## 2021-12-19 ENCOUNTER — Other Ambulatory Visit: Payer: Medicare PPO

## 2021-12-19 ENCOUNTER — Encounter
Admission: RE | Admit: 2021-12-19 | Discharge: 2021-12-19 | Disposition: A | Discharge status: Home / Self Care | Payer: Medicare PPO | Source: Ambulatory Visit | Attending: Orthopedic Surgery | Admitting: Orthopedic Surgery

## 2021-12-19 ENCOUNTER — Other Ambulatory Visit: Payer: Self-pay

## 2021-12-19 VITALS — BP 131/73 | HR 60 | Temp 97.9°F | Resp 18 | Ht 59.0 in | Wt 154.4 lb

## 2021-12-19 DIAGNOSIS — S42295A Other nondisplaced fracture of upper end of left humerus, initial encounter for closed fracture: Secondary | ICD-10-CM | POA: Diagnosis not present

## 2021-12-19 DIAGNOSIS — R001 Bradycardia, unspecified: Secondary | ICD-10-CM | POA: Diagnosis not present

## 2021-12-19 DIAGNOSIS — M1990 Unspecified osteoarthritis, unspecified site: Secondary | ICD-10-CM | POA: Diagnosis not present

## 2021-12-19 DIAGNOSIS — Z743 Need for continuous supervision: Secondary | ICD-10-CM | POA: Diagnosis not present

## 2021-12-19 DIAGNOSIS — R5381 Other malaise: Secondary | ICD-10-CM | POA: Diagnosis not present

## 2021-12-19 DIAGNOSIS — W19XXXA Unspecified fall, initial encounter: Secondary | ICD-10-CM | POA: Diagnosis present

## 2021-12-19 DIAGNOSIS — M25512 Pain in left shoulder: Secondary | ICD-10-CM | POA: Diagnosis not present

## 2021-12-19 DIAGNOSIS — E785 Hyperlipidemia, unspecified: Secondary | ICD-10-CM | POA: Diagnosis present

## 2021-12-19 DIAGNOSIS — Z01818 Encounter for other preprocedural examination: Secondary | ICD-10-CM | POA: Insufficient documentation

## 2021-12-19 DIAGNOSIS — M6281 Muscle weakness (generalized): Secondary | ICD-10-CM | POA: Diagnosis not present

## 2021-12-19 DIAGNOSIS — Z79899 Other long term (current) drug therapy: Secondary | ICD-10-CM | POA: Diagnosis not present

## 2021-12-19 DIAGNOSIS — I129 Hypertensive chronic kidney disease with stage 1 through stage 4 chronic kidney disease, or unspecified chronic kidney disease: Secondary | ICD-10-CM | POA: Diagnosis present

## 2021-12-19 DIAGNOSIS — S42242D 4-part fracture of surgical neck of left humerus, subsequent encounter for fracture with routine healing: Secondary | ICD-10-CM | POA: Diagnosis not present

## 2021-12-19 DIAGNOSIS — Z96642 Presence of left artificial hip joint: Secondary | ICD-10-CM | POA: Diagnosis not present

## 2021-12-19 DIAGNOSIS — G8918 Other acute postprocedural pain: Secondary | ICD-10-CM | POA: Diagnosis not present

## 2021-12-19 DIAGNOSIS — N183 Chronic kidney disease, stage 3 unspecified: Secondary | ICD-10-CM | POA: Diagnosis present

## 2021-12-19 DIAGNOSIS — M8580 Other specified disorders of bone density and structure, unspecified site: Secondary | ICD-10-CM | POA: Diagnosis not present

## 2021-12-19 DIAGNOSIS — I77810 Thoracic aortic ectasia: Secondary | ICD-10-CM | POA: Diagnosis present

## 2021-12-19 DIAGNOSIS — Z01810 Encounter for preprocedural cardiovascular examination: Secondary | ICD-10-CM | POA: Diagnosis not present

## 2021-12-19 DIAGNOSIS — R600 Localized edema: Secondary | ICD-10-CM | POA: Insufficient documentation

## 2021-12-19 DIAGNOSIS — R8271 Bacteriuria: Secondary | ICD-10-CM | POA: Insufficient documentation

## 2021-12-19 DIAGNOSIS — Z9181 History of falling: Secondary | ICD-10-CM | POA: Diagnosis not present

## 2021-12-19 DIAGNOSIS — S4292XA Fracture of left shoulder girdle, part unspecified, initial encounter for closed fracture: Secondary | ICD-10-CM | POA: Diagnosis not present

## 2021-12-19 DIAGNOSIS — S42242A 4-part fracture of surgical neck of left humerus, initial encounter for closed fracture: Secondary | ICD-10-CM | POA: Diagnosis present

## 2021-12-19 DIAGNOSIS — Z96612 Presence of left artificial shoulder joint: Secondary | ICD-10-CM | POA: Diagnosis not present

## 2021-12-19 DIAGNOSIS — Z471 Aftercare following joint replacement surgery: Secondary | ICD-10-CM | POA: Diagnosis not present

## 2021-12-19 HISTORY — DX: Cardiac arrhythmia, unspecified: I49.9

## 2021-12-19 HISTORY — DX: Palpitations: R00.2

## 2021-12-19 HISTORY — DX: Unspecified osteoarthritis, unspecified site: M19.90

## 2021-12-19 HISTORY — DX: Thoracic aortic ectasia: I77.810

## 2021-12-19 LAB — TYPE AND SCREEN
ABO/RH(D): A NEG
Antibody Screen: NEGATIVE

## 2021-12-19 LAB — CBC WITH DIFFERENTIAL/PLATELET
Abs Immature Granulocytes: 0.05 10*3/uL (ref 0.00–0.07)
Basophils Absolute: 0 10*3/uL (ref 0.0–0.1)
Basophils Relative: 0 %
Eosinophils Absolute: 0 10*3/uL (ref 0.0–0.5)
Eosinophils Relative: 0 %
HCT: 30.6 % — ABNORMAL LOW (ref 36.0–46.0)
Hemoglobin: 9.7 g/dL — ABNORMAL LOW (ref 12.0–15.0)
Immature Granulocytes: 1 %
Lymphocytes Relative: 16 %
Lymphs Abs: 1.4 10*3/uL (ref 0.7–4.0)
MCH: 29.5 pg (ref 26.0–34.0)
MCHC: 31.7 g/dL (ref 30.0–36.0)
MCV: 93 fL (ref 80.0–100.0)
Monocytes Absolute: 0.7 10*3/uL (ref 0.1–1.0)
Monocytes Relative: 8 %
Neutro Abs: 6.8 10*3/uL (ref 1.7–7.7)
Neutrophils Relative %: 75 %
Platelets: 406 10*3/uL — ABNORMAL HIGH (ref 150–400)
RBC: 3.29 MIL/uL — ABNORMAL LOW (ref 3.87–5.11)
RDW: 14.7 % (ref 11.5–15.5)
WBC: 9 10*3/uL (ref 4.0–10.5)
nRBC: 0 % (ref 0.0–0.2)

## 2021-12-19 LAB — COMPREHENSIVE METABOLIC PANEL
ALT: 10 U/L (ref 0–44)
AST: 15 U/L (ref 15–41)
Albumin: 3.3 g/dL — ABNORMAL LOW (ref 3.5–5.0)
Alkaline Phosphatase: 66 U/L (ref 38–126)
Anion gap: 11 (ref 5–15)
BUN: 21 mg/dL (ref 8–23)
CO2: 21 mmol/L — ABNORMAL LOW (ref 22–32)
Calcium: 9.2 mg/dL (ref 8.9–10.3)
Chloride: 104 mmol/L (ref 98–111)
Creatinine, Ser: 1.14 mg/dL — ABNORMAL HIGH (ref 0.44–1.00)
GFR, Estimated: 49 mL/min — ABNORMAL LOW (ref >60)
Glucose, Bld: 95 mg/dL (ref 70–99)
Potassium: 4.3 mmol/L (ref 3.5–5.1)
Sodium: 136 mmol/L (ref 135–145)
Total Bilirubin: 0.7 mg/dL (ref 0.3–1.2)
Total Protein: 7.3 g/dL (ref 6.5–8.1)

## 2021-12-19 LAB — URINALYSIS, ROUTINE W REFLEX MICROSCOPIC
Glucose, UA: NEGATIVE mg/dL
Hgb urine dipstick: NEGATIVE
Ketones, ur: NEGATIVE mg/dL
Nitrite: NEGATIVE
Protein, ur: 100 mg/dL — AB
Specific Gravity, Urine: 1.028 (ref 1.005–1.030)
pH: 5 (ref 5.0–8.0)

## 2021-12-19 LAB — SURGICAL PCR SCREEN
MRSA, PCR: NEGATIVE
Staphylococcus aureus: NEGATIVE

## 2021-12-19 NOTE — Patient Instructions (Addendum)
Your procedure is scheduled on: Thursday 12/20/21 ?Report to the Registration Desk on the 1st floor of the [facility]. ?To find out your arrival time, please call [phone] between 1PM - 3PM on: Wednesday 12/19/21 ? ?REMEMBER: ?Instructions that are not followed completely may result in serious medical risk, up to and including death; or upon the discretion of your surgeon and anesthesiologist your surgery may need to be rescheduled. ? ?Do not eat food after midnight the night before surgery.  ?No gum chewing, lozengers or hard candies. ? ?You may however, drink CLEAR liquids up to 2 hours before you are scheduled to arrive for your surgery. Do not drink anything within 2 hours of your scheduled arrival time. ? ?Clear liquids include: ?- water  ?- apple juice without pulp ?- gatorade (not RED colors) ?- black coffee or tea (Do NOT add milk or creamers to the coffee or tea) ?Do NOT drink anything that is not on this list. ? ?TAKE THESE MEDICATIONS THE MORNING OF SURGERY WITH A SIP OF WATER: ?amLODipine (NORVASC) 10 MG tablet ?ezetimibe-simvastatin (VYTORIN) 10-20 MG tablet ? ?One week prior to surgery: ?Stop Anti-inflammatories (NSAIDS) such as Advil, Aleve, Ibuprofen, Motrin, Naproxen, Naprosyn and Aspirin based products such as Excedrin, Goodys Powder, BC Powder. ? ?Stop taking your Cholecalciferol (VITAMIN D3) 50 MCG (2000 UT) TABS, Cyanocobalamin (B-12 PO) and ANY other OVER THE COUNTER supplements until after surgery. ? ?You may however, continue to take Tylenol if needed for pain up until the day of surgery. ? ?No Alcohol for 24 hours before or after surgery. ? ?No Smoking including e-cigarettes for 24 hours prior to surgery.  ?No chewable tobacco products for at least 6 hours prior to surgery.  ?No nicotine patches on the day of surgery. ? ?Do not use any "recreational" drugs for at least a week prior to your surgery.  ?Please be advised that the combination of cocaine and anesthesia may have negative  outcomes, up to and including death. ?If you test positive for cocaine, your surgery will be cancelled. ? ?On the morning of surgery brush your teeth with toothpaste and water, you may rinse your mouth with mouthwash if you wish. ?Do not swallow any toothpaste or mouthwash. ? ?Use CHG Soap as directed on instruction sheet. ? ?Total shoulder arthroplasty: Use Benzoyl Peroxide 5% gel as directed on instruction sheet ? ?Do not wear jewelry, make-up, hairpins, clips or nail polish. ? ?Do not wear lotions, powders, or perfumes.  ? ?Do not shave body from the neck down 48 hours prior to surgery just in case you cut yourself which could leave a site for infection.  ?Also, freshly shaved skin may become irritated if using the CHG soap. ? ?Contact lenses, hearing aids and dentures may not be worn into surgery. ? ?Do not bring valuables to the hospital. [facility] is not responsible for any missing/lost belongings or valuables.  ? ?Notify your doctor if there is any change in your medical condition (cold, fever, infection). ? ?Wear comfortable clothing (specific to your surgery type) to the hospital. ? ?After surgery, you can help prevent lung complications by doing breathing exercises.  ?Take deep breaths and cough every 1-2 hours. Your doctor may order a device called an Incentive Spirometer to help you take deep breaths. ? ?If you are being admitted to the hospital overnight, leave your suitcase in the car. ?After surgery it may be brought to your room. ? ?If you are taking public transportation, you will need to have  a responsible adult (18 years or older) with you. ?Please confirm with your physician that it is acceptable to use public transportation.  ? ?Please call the Pre-admissions Testing Dept. at [phone] if you have any questions about these instructions. ? ?Surgery Visitation Policy: ? ?Patients undergoing a surgery or procedure may have two family members or support persons with them as long as the person  is not COVID-19 positive or experiencing its symptoms.  ? ?Inpatient Visitation:   ? ?Visiting hours are 7 a.m. to 8 p.m. ?Up to four visitors are allowed at one time in a patient room, including children. The visitors may rotate out with other people during the day. One designated support person (adult) may remain overnight.  ?

## 2021-12-20 ENCOUNTER — Inpatient Hospital Stay
Admission: RE | Admit: 2021-12-20 | Discharge: 2021-12-24 | DRG: 483 | Disposition: A | Discharge status: Skilled Nursing Facility | Payer: Medicare PPO | Source: Skilled Nursing Facility | Attending: Orthopedic Surgery | Admitting: Orthopedic Surgery

## 2021-12-20 ENCOUNTER — Inpatient Hospital Stay: Payer: Medicare PPO | Admitting: Anesthesiology

## 2021-12-20 ENCOUNTER — Inpatient Hospital Stay: Payer: Medicare PPO

## 2021-12-20 ENCOUNTER — Encounter
Admission: RE | Disposition: A | Discharge status: Skilled Nursing Facility | Payer: Self-pay | Source: Home / Self Care | Attending: Orthopedic Surgery

## 2021-12-20 ENCOUNTER — Other Ambulatory Visit: Payer: Self-pay

## 2021-12-20 DIAGNOSIS — S42242A 4-part fracture of surgical neck of left humerus, initial encounter for closed fracture: Principal | ICD-10-CM | POA: Diagnosis present

## 2021-12-20 DIAGNOSIS — W19XXXA Unspecified fall, initial encounter: Secondary | ICD-10-CM | POA: Diagnosis present

## 2021-12-20 DIAGNOSIS — Z79899 Other long term (current) drug therapy: Secondary | ICD-10-CM | POA: Diagnosis not present

## 2021-12-20 DIAGNOSIS — N183 Chronic kidney disease, stage 3 unspecified: Secondary | ICD-10-CM | POA: Diagnosis present

## 2021-12-20 DIAGNOSIS — E785 Hyperlipidemia, unspecified: Secondary | ICD-10-CM | POA: Diagnosis present

## 2021-12-20 DIAGNOSIS — I77810 Thoracic aortic ectasia: Secondary | ICD-10-CM | POA: Diagnosis present

## 2021-12-20 DIAGNOSIS — S42293P Other displaced fracture of upper end of unspecified humerus, subsequent encounter for fracture with malunion: Secondary | ICD-10-CM | POA: Diagnosis present

## 2021-12-20 DIAGNOSIS — I129 Hypertensive chronic kidney disease with stage 1 through stage 4 chronic kidney disease, or unspecified chronic kidney disease: Secondary | ICD-10-CM | POA: Diagnosis present

## 2021-12-20 HISTORY — PX: REVERSE SHOULDER ARTHROPLASTY: SHX5054

## 2021-12-20 HISTORY — PX: BICEPT TENODESIS: SHX5116

## 2021-12-20 LAB — ABO/RH: ABO/RH(D): A NEG

## 2021-12-20 SURGERY — ARTHROPLASTY, SHOULDER, TOTAL, REVERSE
Anesthesia: Regional | Site: Shoulder | Laterality: Left

## 2021-12-20 MED ORDER — GLYCOPYRROLATE 0.2 MG/ML IJ SOLN
INTRAMUSCULAR | Status: DC | PRN
  Administered 2021-12-20: .2 mg via INTRAVENOUS

## 2021-12-20 MED ORDER — PROPRANOLOL HCL 10 MG PO TABS: 10.0000 mg | ORAL_TABLET | Freq: Every day | ORAL | Status: DC | PRN

## 2021-12-20 MED ORDER — METOCLOPRAMIDE HCL 5 MG PO TABS: 5.0000 mg | ORAL_TABLET | Freq: Three times a day (TID) | ORAL | Status: DC | PRN

## 2021-12-20 MED ORDER — CEFAZOLIN SODIUM-DEXTROSE 2-4 GM/100ML-% IV SOLN
INTRAVENOUS | Status: AC
  Administered 2021-12-20: 2 g via INTRAVENOUS
  Filled 2021-12-20: qty 100

## 2021-12-20 MED ORDER — FENTANYL CITRATE PF 50 MCG/ML IJ SOSY
PREFILLED_SYRINGE | INTRAMUSCULAR | Status: AC
  Administered 2021-12-20: 50 ug via INTRAVENOUS
  Filled 2021-12-20: qty 1

## 2021-12-20 MED ORDER — SODIUM CHLORIDE 0.9 % IV SOLN
Status: DC | PRN
  Administered 2021-12-20: 1004 mL

## 2021-12-20 MED ORDER — BUPIVACAINE HCL (PF) 0.5 % IJ SOLN
INTRAMUSCULAR | Status: AC
  Filled 2021-12-20: qty 10

## 2021-12-20 MED ORDER — VANCOMYCIN HCL 1000 MG IV SOLR
INTRAVENOUS | Status: DC | PRN
  Administered 2021-12-20: 1000 mg

## 2021-12-20 MED ORDER — FENTANYL CITRATE (PF) 100 MCG/2ML IJ SOLN: 25.0000 ug | INTRAMUSCULAR | Status: DC | PRN

## 2021-12-20 MED ORDER — CEFAZOLIN SODIUM-DEXTROSE 2-4 GM/100ML-% IV SOLN
2.0000 g | Freq: Four times a day (QID) | INTRAVENOUS | Status: AC
  Administered 2021-12-21 (×2): 2 g via INTRAVENOUS
  Filled 2021-12-20 (×6): qty 100

## 2021-12-20 MED ORDER — ALUM & MAG HYDROXIDE-SIMETH 200-200-20 MG/5ML PO SUSP: 30.0000 mL | ORAL | Status: DC | PRN

## 2021-12-20 MED ORDER — ASPIRIN EC 325 MG PO TBEC
325.0000 mg | DELAYED_RELEASE_TABLET | Freq: Every day | ORAL | Status: DC
  Administered 2021-12-21 – 2021-12-24 (×4): 325 mg via ORAL
  Filled 2021-12-20 (×4): qty 1

## 2021-12-20 MED ORDER — BUPIVACAINE-EPINEPHRINE 0.5% -1:200000 IJ SOLN
INTRAMUSCULAR | Status: DC | PRN
  Administered 2021-12-20: 30 mL

## 2021-12-20 MED ORDER — VITAMIN D3 25 MCG (1000 UNIT) PO TABS
2000.0000 [IU] | ORAL_TABLET | Freq: Every morning | ORAL | Status: DC
  Administered 2021-12-21 – 2021-12-24 (×4): 2000 [IU] via ORAL
  Filled 2021-12-20 (×7): qty 2

## 2021-12-20 MED ORDER — PHENYLEPHRINE 40 MCG/ML (10ML) SYRINGE FOR IV PUSH (FOR BLOOD PRESSURE SUPPORT)
PREFILLED_SYRINGE | INTRAVENOUS | Status: AC
  Filled 2021-12-20: qty 10

## 2021-12-20 MED ORDER — EZETIMIBE 10 MG PO TABS
10.0000 mg | ORAL_TABLET | Freq: Every day | ORAL | Status: DC
  Administered 2021-12-21 – 2021-12-24 (×4): 10 mg via ORAL
  Filled 2021-12-20 (×4): qty 1

## 2021-12-20 MED ORDER — ORAL CARE MOUTH RINSE: 15.0000 mL | Freq: Once | OROMUCOSAL | Status: AC

## 2021-12-20 MED ORDER — DEXAMETHASONE SODIUM PHOSPHATE 10 MG/ML IJ SOLN
INTRAMUSCULAR | Status: AC
  Filled 2021-12-20: qty 1

## 2021-12-20 MED ORDER — FENTANYL CITRATE (PF) 100 MCG/2ML IJ SOLN
INTRAMUSCULAR | Status: DC | PRN
  Administered 2021-12-20 (×4): 50 ug via INTRAVENOUS

## 2021-12-20 MED ORDER — VANCOMYCIN HCL 1000 MG IV SOLR
INTRAVENOUS | Status: AC
  Filled 2021-12-20: qty 20

## 2021-12-20 MED ORDER — OXYCODONE HCL 5 MG PO TABS: 5.0000 mg | ORAL_TABLET | Freq: Once | ORAL | Status: DC | PRN

## 2021-12-20 MED ORDER — OXYCODONE HCL 5 MG PO TABS
2.5000 mg | ORAL_TABLET | ORAL | Status: DC | PRN
  Administered 2021-12-20 – 2021-12-23 (×7): 5 mg via ORAL
  Filled 2021-12-20 (×7): qty 1

## 2021-12-20 MED ORDER — BUPIVACAINE LIPOSOME 1.3 % IJ SUSP
INTRAMUSCULAR | Status: AC
  Filled 2021-12-20: qty 10

## 2021-12-20 MED ORDER — SIMVASTATIN 20 MG PO TABS
20.0000 mg | ORAL_TABLET | Freq: Every day | ORAL | Status: DC
  Administered 2021-12-21 – 2021-12-24 (×4): 20 mg via ORAL
  Filled 2021-12-20 (×4): qty 1

## 2021-12-20 MED ORDER — AMLODIPINE BESYLATE 10 MG PO TABS
10.0000 mg | ORAL_TABLET | Freq: Every morning | ORAL | Status: DC
  Administered 2021-12-21 – 2021-12-24 (×4): 10 mg via ORAL
  Filled 2021-12-20 (×4): qty 1

## 2021-12-20 MED ORDER — TRANEXAMIC ACID-NACL 1000-0.7 MG/100ML-% IV SOLN
INTRAVENOUS | Status: AC
  Administered 2021-12-20: 1000 mg via INTRAVENOUS
  Filled 2021-12-20: qty 100

## 2021-12-20 MED ORDER — TRANEXAMIC ACID-NACL 1000-0.7 MG/100ML-% IV SOLN: 1000.0000 mg | Freq: Once | INTRAVENOUS | Status: AC

## 2021-12-20 MED ORDER — TRANEXAMIC ACID 1000 MG/10ML IV SOLN
INTRAVENOUS | Status: AC
  Filled 2021-12-20: qty 10

## 2021-12-20 MED ORDER — ROCURONIUM BROMIDE 10 MG/ML (PF) SYRINGE
PREFILLED_SYRINGE | INTRAVENOUS | Status: AC
  Filled 2021-12-20: qty 10

## 2021-12-20 MED ORDER — ONDANSETRON HCL 4 MG PO TABS: 4.0000 mg | ORAL_TABLET | Freq: Four times a day (QID) | ORAL | Status: DC | PRN

## 2021-12-20 MED ORDER — ROCURONIUM BROMIDE 100 MG/10ML IV SOLN
INTRAVENOUS | Status: DC | PRN
Start: 2021-12-20 — End: 2021-12-20
  Administered 2021-12-20: 50 mg via INTRAVENOUS

## 2021-12-20 MED ORDER — FENTANYL CITRATE (PF) 100 MCG/2ML IJ SOLN
INTRAMUSCULAR | Status: AC
  Filled 2021-12-20: qty 2

## 2021-12-20 MED ORDER — OXYCODONE HCL 5 MG/5ML PO SOLN: 5.0000 mg | Freq: Once | ORAL | Status: DC | PRN

## 2021-12-20 MED ORDER — HYDROMORPHONE HCL 1 MG/ML IJ SOLN: 0.2000 mg | INTRAMUSCULAR | Status: DC | PRN

## 2021-12-20 MED ORDER — METOCLOPRAMIDE HCL 5 MG/ML IJ SOLN: 5.0000 mg | Freq: Three times a day (TID) | INTRAMUSCULAR | Status: DC | PRN

## 2021-12-20 MED ORDER — MENTHOL 3 MG MT LOZG: 1.0000 | LOZENGE | OROMUCOSAL | Status: DC | PRN

## 2021-12-20 MED ORDER — ONDANSETRON HCL 4 MG/2ML IJ SOLN
INTRAMUSCULAR | Status: DC | PRN
  Administered 2021-12-20: 4 mg via INTRAVENOUS

## 2021-12-20 MED ORDER — ACETAMINOPHEN 500 MG PO TABS
1000.0000 mg | ORAL_TABLET | Freq: Three times a day (TID) | ORAL | Status: AC
  Administered 2021-12-21 (×2): 1000 mg via ORAL
  Filled 2021-12-20 (×5): qty 2

## 2021-12-20 MED ORDER — LACTATED RINGERS IV SOLN: INTRAVENOUS | Status: DC

## 2021-12-20 MED ORDER — PROPOFOL 10 MG/ML IV BOLUS
INTRAVENOUS | Status: AC
  Filled 2021-12-20: qty 20

## 2021-12-20 MED ORDER — DOCUSATE SODIUM 100 MG PO CAPS
100.0000 mg | ORAL_CAPSULE | Freq: Two times a day (BID) | ORAL | Status: DC
  Administered 2021-12-20 – 2021-12-24 (×7): 100 mg via ORAL
  Filled 2021-12-20 (×9): qty 1

## 2021-12-20 MED ORDER — EZETIMIBE-SIMVASTATIN 10-20 MG PO TABS
1.0000 | ORAL_TABLET | Freq: Every morning | ORAL | Status: DC
Start: 2021-12-21 — End: 2021-12-20

## 2021-12-20 MED ORDER — PHENOL 1.4 % MT LIQD: 1.0000 | OROMUCOSAL | Status: DC | PRN

## 2021-12-20 MED ORDER — ONDANSETRON HCL 4 MG/2ML IJ SOLN: 4.0000 mg | Freq: Four times a day (QID) | INTRAMUSCULAR | Status: DC | PRN

## 2021-12-20 MED ORDER — CHLORHEXIDINE GLUCONATE 0.12 % MT SOLN
OROMUCOSAL | Status: AC
  Administered 2021-12-20: 15 mL via OROMUCOSAL
  Filled 2021-12-20: qty 15

## 2021-12-20 MED ORDER — MIDAZOLAM HCL 2 MG/2ML IJ SOLN: 1.0000 mg | Freq: Once | INTRAMUSCULAR | Status: AC

## 2021-12-20 MED ORDER — BUPIVACAINE-EPINEPHRINE (PF) 0.5% -1:200000 IJ SOLN
INTRAMUSCULAR | Status: AC
  Filled 2021-12-20: qty 30

## 2021-12-20 MED ORDER — ACETAMINOPHEN 10 MG/ML IV SOLN: 1000.0000 mg | Freq: Once | INTRAVENOUS | Status: DC | PRN

## 2021-12-20 MED ORDER — VITAMIN B-12 1000 MCG PO TABS
1000.0000 ug | ORAL_TABLET | Freq: Every morning | ORAL | Status: DC
  Administered 2021-12-21 – 2021-12-24 (×4): 1000 ug via ORAL
  Filled 2021-12-20 (×4): qty 1

## 2021-12-20 MED ORDER — ACETAMINOPHEN 10 MG/ML IV SOLN
INTRAVENOUS | Status: AC
  Filled 2021-12-20: qty 100

## 2021-12-20 MED ORDER — CHLORHEXIDINE GLUCONATE 0.12 % MT SOLN
15.0000 mL | Freq: Once | OROMUCOSAL | Status: AC
Start: 2021-12-20 — End: 2021-12-20

## 2021-12-20 MED ORDER — ONDANSETRON HCL 4 MG/2ML IJ SOLN
INTRAMUSCULAR | Status: AC
  Filled 2021-12-20: qty 2

## 2021-12-20 MED ORDER — FENTANYL CITRATE PF 50 MCG/ML IJ SOSY: 50.0000 ug | PREFILLED_SYRINGE | Freq: Once | INTRAMUSCULAR | Status: AC

## 2021-12-20 MED ORDER — OXYCODONE HCL 5 MG PO TABS
5.0000 mg | ORAL_TABLET | ORAL | Status: DC | PRN
  Administered 2021-12-21 – 2021-12-24 (×3): 10 mg via ORAL
  Filled 2021-12-20 (×3): qty 2
  Filled 2021-12-20 (×2): qty 1

## 2021-12-20 MED ORDER — CEFAZOLIN SODIUM-DEXTROSE 2-4 GM/100ML-% IV SOLN
2.0000 g | INTRAVENOUS | Status: AC
Start: 2021-12-20 — End: 2021-12-20
  Administered 2021-12-20: 2 g via INTRAVENOUS

## 2021-12-20 MED ORDER — ZOLPIDEM TARTRATE 5 MG PO TABS: 2.5000 mg | ORAL_TABLET | Freq: Every evening | ORAL | Status: DC | PRN

## 2021-12-20 MED ORDER — BUPIVACAINE LIPOSOME 1.3 % IJ SUSP
INTRAMUSCULAR | Status: DC | PRN
  Administered 2021-12-20: 10 mL via PERINEURAL

## 2021-12-20 MED ORDER — BUPIVACAINE-EPINEPHRINE (PF) 0.25% -1:200000 IJ SOLN
INTRAMUSCULAR | Status: AC
  Filled 2021-12-20: qty 30

## 2021-12-20 MED ORDER — SENNOSIDES-DOCUSATE SODIUM 8.6-50 MG PO TABS
1.0000 | ORAL_TABLET | Freq: Every evening | ORAL | Status: DC | PRN
  Administered 2021-12-21: 1 via ORAL
  Filled 2021-12-20: qty 1

## 2021-12-20 MED ORDER — PROPOFOL 10 MG/ML IV BOLUS
INTRAVENOUS | Status: DC | PRN
  Administered 2021-12-20: 150 mg via INTRAVENOUS

## 2021-12-20 MED ORDER — BUPIVACAINE HCL (PF) 0.5 % IJ SOLN
INTRAMUSCULAR | Status: DC | PRN
  Administered 2021-12-20: 10 mL via PERINEURAL

## 2021-12-20 MED ORDER — LIDOCAINE HCL (PF) 2 % IJ SOLN
INTRAMUSCULAR | Status: AC
  Filled 2021-12-20: qty 5

## 2021-12-20 MED ORDER — SODIUM CHLORIDE 0.9 % IV SOLN: INTRAVENOUS | Status: DC

## 2021-12-20 MED ORDER — NEOMYCIN-POLYMYXIN B GU 40-200000 IR SOLN
Status: AC
  Filled 2021-12-20: qty 1

## 2021-12-20 MED ORDER — ACETAMINOPHEN 10 MG/ML IV SOLN
INTRAVENOUS | Status: DC | PRN
  Administered 2021-12-20: 1000 mg via INTRAVENOUS

## 2021-12-20 MED ORDER — TRANEXAMIC ACID-NACL 1000-0.7 MG/100ML-% IV SOLN
INTRAVENOUS | Status: DC | PRN
  Administered 2021-12-20: 1000 mg via INTRAVENOUS

## 2021-12-20 MED ORDER — BISACODYL 10 MG RE SUPP: 10.0000 mg | Freq: Every day | RECTAL | Status: DC | PRN

## 2021-12-20 MED ORDER — SUGAMMADEX SODIUM 200 MG/2ML IV SOLN
INTRAVENOUS | Status: DC | PRN
  Administered 2021-12-20: 200 mg via INTRAVENOUS

## 2021-12-20 MED ORDER — ONDANSETRON HCL 4 MG/2ML IJ SOLN: 4.0000 mg | Freq: Once | INTRAMUSCULAR | Status: DC | PRN

## 2021-12-20 MED ORDER — FAMOTIDINE 20 MG PO TABS
ORAL_TABLET | ORAL | Status: AC
  Administered 2021-12-20: 20 mg via ORAL
  Filled 2021-12-20: qty 1

## 2021-12-20 MED ORDER — SODIUM CHLORIDE 0.9 % IV SOLN
Status: DC | PRN
  Administered 2021-12-20: 3012 mL

## 2021-12-20 MED ORDER — DEXAMETHASONE SODIUM PHOSPHATE 10 MG/ML IJ SOLN
INTRAMUSCULAR | Status: DC | PRN
  Administered 2021-12-20: 10 mg via INTRAVENOUS

## 2021-12-20 MED ORDER — LIDOCAINE HCL (CARDIAC) PF 100 MG/5ML IV SOSY
PREFILLED_SYRINGE | INTRAVENOUS | Status: DC | PRN
  Administered 2021-12-20: 50 mg via INTRAVENOUS

## 2021-12-20 MED ORDER — FAMOTIDINE 20 MG PO TABS: 20.0000 mg | ORAL_TABLET | Freq: Once | ORAL | Status: AC

## 2021-12-20 MED ORDER — ENALAPRIL MALEATE 20 MG PO TABS
20.0000 mg | ORAL_TABLET | Freq: Every morning | ORAL | Status: DC
  Administered 2021-12-21 – 2021-12-24 (×4): 20 mg via ORAL
  Filled 2021-12-20 (×4): qty 1

## 2021-12-20 MED ORDER — MIDAZOLAM HCL 2 MG/2ML IJ SOLN
INTRAMUSCULAR | Status: AC
Start: 2021-12-20 — End: 2021-12-20
  Administered 2021-12-20: 1 mg via INTRAVENOUS
  Filled 2021-12-20: qty 2

## 2021-12-20 SURGICAL SUPPLY — 81 items
BASEPLATE GLENOSPHERE 25 STD (Miscellaneous) ×1 IMPLANT
BIT DRILL 3.2 PERIPHERAL SCREW (BIT) ×1 IMPLANT
BLADE SAGITTAL WIDE XTHICK NO (BLADE) ×2 IMPLANT
BODY PROXIMAL PTC 13X132.5 (Joint) IMPLANT
CAP LOCKING COCR (Cap) ×1 IMPLANT
CHLORAPREP W/TINT 26 (MISCELLANEOUS) ×2 IMPLANT
CNTNR SPEC 2.5X3XGRAD LEK (MISCELLANEOUS) ×1
CONT SPEC 4OZ STER OR WHT (MISCELLANEOUS) ×1
CONTAINER SPEC 2.5X3XGRAD LEK (MISCELLANEOUS) ×1 IMPLANT
COOLER POLAR GLACIER W/PUMP (MISCELLANEOUS) ×2 IMPLANT
COVER BACK TABLE REUSABLE LG (DRAPES) ×2 IMPLANT
DERMABOND ADVANCED (GAUZE/BANDAGES/DRESSINGS)
DERMABOND ADVANCED .7 DNX12 (GAUZE/BANDAGES/DRESSINGS) ×1 IMPLANT
DRAPE 3/4 80X56 (DRAPES) ×4 IMPLANT
DRAPE INCISE IOBAN 66X45 STRL (DRAPES) ×4 IMPLANT
DRAPE U-SHAPE 47X51 STRL (DRAPES) ×2 IMPLANT
DRSG OPSITE POSTOP 3X4 (GAUZE/BANDAGES/DRESSINGS) ×1 IMPLANT
DRSG OPSITE POSTOP 4X6 (GAUZE/BANDAGES/DRESSINGS) ×1 IMPLANT
DRSG OPSITE POSTOP 4X8 (GAUZE/BANDAGES/DRESSINGS) ×2 IMPLANT
DRSG TEGADERM 2-3/8X2-3/4 SM (GAUZE/BANDAGES/DRESSINGS) ×1 IMPLANT
DRSG TEGADERM 4X10 (GAUZE/BANDAGES/DRESSINGS) ×3 IMPLANT
ELECT REM PT RETURN 9FT ADLT (ELECTROSURGICAL) ×2
ELECTRODE REM PT RTRN 9FT ADLT (ELECTROSURGICAL) ×1 IMPLANT
GAUZE XEROFORM 1X8 LF (GAUZE/BANDAGES/DRESSINGS) ×2 IMPLANT
GLENOSPHERE REV SHOULDER 36 (Joint) ×1 IMPLANT
GLOVE SRG 8 PF TXTR STRL LF DI (GLOVE) ×2 IMPLANT
GLOVE SURG GAMMEX PI TX LF 7.5 (GLOVE) ×4 IMPLANT
GLOVE SURG ORTHO LTX SZ8 (GLOVE) ×4 IMPLANT
GLOVE SURG SYN 8.0 (GLOVE) ×2 IMPLANT
GLOVE SURG SYN 8.0 PF PI (GLOVE) ×1 IMPLANT
GLOVE SURG UNDER POLY LF SZ8 (GLOVE) ×2
GOWN STRL REUS W/ TWL LRG LVL3 (GOWN DISPOSABLE) ×2 IMPLANT
GOWN STRL REUS W/ TWL XL LVL3 (GOWN DISPOSABLE) ×1 IMPLANT
GOWN STRL REUS W/TWL LRG LVL3 (GOWN DISPOSABLE) ×2
GOWN STRL REUS W/TWL XL LVL3 (GOWN DISPOSABLE) ×1
GUIDEWIRE GLENOID 2.5X220 (WIRE) ×1 IMPLANT
HEMOVAC 400CC 10FR (MISCELLANEOUS) ×3 IMPLANT
HOOD PEEL AWAY FLYTE STAYCOOL (MISCELLANEOUS) ×6 IMPLANT
IMPL REVERSE SHOULDER 0X3.5 (Shoulder) IMPLANT
IMPLANT REVERSE SHOULDER 0X3.5 (Shoulder) ×2 IMPLANT
INSERT HUMERAL 36X6MM 12.5DEG (Insert) ×1 IMPLANT
KIT STABILIZATION SHOULDER (MISCELLANEOUS) ×2 IMPLANT
MANIFOLD NEPTUNE II (INSTRUMENTS) ×2 IMPLANT
MASK FACE SPIDER DISP (MASK) ×2 IMPLANT
MAT ABSORB  FLUID 56X50 GRAY (MISCELLANEOUS) ×1
MAT ABSORB FLUID 56X50 GRAY (MISCELLANEOUS) ×2 IMPLANT
NDL REVERSE CUT 1/2 CRC (NEEDLE) ×1 IMPLANT
NDL SPNL 20GX3.5 QUINCKE YW (NEEDLE) ×1 IMPLANT
NEEDLE REVERSE CUT 1/2 CRC (NEEDLE) ×2 IMPLANT
NEEDLE SPNL 20GX3.5 QUINCKE YW (NEEDLE) ×2 IMPLANT
NS IRRIG 1000ML POUR BTL (IV SOLUTION) ×2 IMPLANT
PACK ARTHROSCOPY SHOULDER (MISCELLANEOUS) ×2 IMPLANT
PAD WRAPON POLAR SHDR XLG (MISCELLANEOUS) ×1 IMPLANT
PROXIMAL BODY PTC 13X132.5 (Joint) ×2 IMPLANT
PRT COAT DISTL STEM 13 SHOUL (Miscellaneous) ×2 IMPLANT
PULSAVAC PLUS IRRIG FAN TIP (DISPOSABLE) ×2
RETRIEVER SUT HEWSON (MISCELLANEOUS) ×1 IMPLANT
SCREW 5.5X14 (Screw) ×1 IMPLANT
SCREW BONE THREAD 6.5X35 (Screw) ×1 IMPLANT
SCREW PERIPHERAL 5.0X34 (Screw) ×2 IMPLANT
SCREW SHLD ASSEMBLY AEQ 20 (Spacer) ×1 IMPLANT
SLING ULTRA II LG (MISCELLANEOUS) ×1 IMPLANT
SLING ULTRA II M (MISCELLANEOUS) ×1 IMPLANT
SPACER SHLD CMT AEQ 13X20 (Spacer) ×1 IMPLANT
SPONGE GAUZE 2X2 8PLY STRL LF (GAUZE/BANDAGES/DRESSINGS) ×1 IMPLANT
SPONGE T-LAP 18X18 ~~LOC~~+RFID (SPONGE) ×9 IMPLANT
STAPLER SKIN PROX 35W (STAPLE) ×1 IMPLANT
STEM SHLDR DIST PRTLY COATD 13 (Miscellaneous) IMPLANT
STRAP SAFETY 5IN WIDE (MISCELLANEOUS) ×4 IMPLANT
SUT FIBERWIRE #2 38 BLUE 1/2 (SUTURE) ×8
SUT MNCRL AB 4-0 PS2 18 (SUTURE) IMPLANT
SUT PROLENE 6 0 P 1 18 (SUTURE) ×2 IMPLANT
SUT TICRON 2-0 30IN 311381 (SUTURE) ×6 IMPLANT
SUT VIC AB 0 CT1 36 (SUTURE) ×2 IMPLANT
SUT VIC AB 2-0 CT2 27 (SUTURE) ×4 IMPLANT
SUTURE FIBERWR #2 38 BLUE 1/2 (SUTURE) ×4 IMPLANT
SYR 20ML LL LF (SYRINGE) ×2 IMPLANT
SYR 30ML LL (SYRINGE) ×4 IMPLANT
TIP FAN IRRIG PULSAVAC PLUS (DISPOSABLE) ×1 IMPLANT
TRAY FOLEY SLVR 16FR LF STAT (SET/KITS/TRAYS/PACK) IMPLANT
WRAPON POLAR PAD SHDR XLG (MISCELLANEOUS) ×2

## 2021-12-20 NOTE — H&P (Signed)
Paper H&P to be scanned into permanent record. H&P reviewed. No significant changes noted.  

## 2021-12-20 NOTE — Anesthesia Postprocedure Evaluation (Signed)
Anesthesia Post Note ? ?Patient: Natasha Sawyer ? ?Procedure(s) Performed: Left reverse shoulder arthroplasty for proximal humerus fracture, biceps tenodesis (Left: Shoulder) ?Left reverse shoulder arthroplasty for proximal humerus fracture, biceps tenodesis (Left: Shoulder) ? ?Patient location during evaluation: PACU ?Anesthesia Type: General ?Level of consciousness: awake and alert, oriented and patient cooperative ?Pain management: pain level controlled ?Vital Signs Assessment: post-procedure vital signs reviewed and stable ?Respiratory status: spontaneous breathing, nonlabored ventilation and respiratory function stable ?Cardiovascular status: blood pressure returned to baseline and stable ?Postop Assessment: adequate PO intake ?Anesthetic complications: no ? ? ?No notable events documented. ? ? ?Last Vitals:  ?Vitals:  ? 12/20/21 1745 12/20/21 1815  ?BP: (!) 143/62 (!) 156/62  ?Pulse: (!) 113 72  ?Resp: 20 20  ?Temp: (!) 35.8 ?C (!) 36.2 ?C  ?SpO2: 100% 92%  ?  ?Last Pain:  ?Vitals:  ? 12/20/21 1815  ?TempSrc:   ?PainSc: Asleep  ? ? ?  ?  ?  ?  ?  ?  ? ?[name] ? ? ? ? ?

## 2021-12-20 NOTE — Progress Notes (Signed)
One hour after interscalene block placement, patient says her arm doesn't feel much different. She is able to move her hand and forearm. She could not move her shoulder intitially to begin with, so it's hard to assess the strength of that. ? ?I did test sensation to cold and she thinks there's a difference in sensation over her shoulder area. I told her and family that the block will likely be suboptimal, but given the decreased sensation, the pain will hopefully be somewhat alleviated. I am hesitant to repeat a block given her small size and the fact that the initial block may in fact be working somewhat. Patient and family understand. ?

## 2021-12-20 NOTE — Anesthesia Procedure Notes (Signed)
Anesthesia Regional Block: Interscalene brachial plexus block   Pre-Anesthetic Checklist: , timeout performed,  Correct Patient, Correct Site, Correct Laterality,  Correct Procedure, Correct Position, site marked,  Risks and benefits discussed,  Surgical consent,  Pre-op evaluation,  At surgeon's request and post-op pain management  Laterality: Left  Prep: chloraprep       Needles:  Injection technique: Single-shot  Needle Type: Echogenic Needle     Needle Length: 4cm  Needle Gauge: 25     Additional Needles:   Procedures:,,,, ultrasound used (permanent image in chart),,    Narrative:  Injection made incrementally with aspirations every 5 mL.  Performed by: Personally  Anesthesiologist: Taraoluwa Thakur, MD  Additional Notes: Patient's chart reviewed and they were deemed appropriate candidate for procedure, at surgeon's request. Patient educated about risks, benefits, and alternatives of the block including but not limited to: temporary or permanent nerve damage, bleeding, infection, damage to surround tissues, pneumothorax, hemidiaphragmatic paralysis, unilateral Horner's syndrome, block failure, local anesthetic toxicity. Patient expressed understanding. A formal time-out was conducted consistent with institution rules.  Monitors were applied, and minimal sedation used (see nursing record). The site was prepped with skin prep and allowed to dry, and sterile gloves were used. A high frequency linear ultrasound probe with probe cover was utilized throughout. C5-7 nerve roots located and appeared anatomically normal, local anesthetic injected around them, and echogenic block needle trajectory was monitored throughout. Aspiration performed every 5ml. Lung and blood vessels were avoided. All injections were performed without resistance and free of blood and paresthesias. The patient tolerated the procedure well.  Injectate: 10ml exparel + 10ml 0.5% bupivacaine      

## 2021-12-20 NOTE — Anesthesia Procedure Notes (Signed)
Procedure Name: Intubation ?Date/Time: 12/20/2021 2:00 PM ?Performed by: Rolla Plate, CRNA ?Pre-anesthesia Checklist: Patient identified, Patient being monitored, Timeout performed, Emergency Drugs available and Suction available ?Patient Re-evaluated:Patient Re-evaluated prior to induction ?Oxygen Delivery Method: Circle system utilized ?Preoxygenation: Pre-oxygenation with 100% oxygen ?Induction Type: IV induction ?Ventilation: Mask ventilation without difficulty ?Laryngoscope Size: Mac and 3 ?Grade View: Grade I ?Tube type: Oral ?Tube size: 6.5 mm ?Number of attempts: 1 ?Airway Equipment and Method: Stylet and Video-laryngoscopy ?Placement Confirmation: ETT inserted through vocal cords under direct vision, positive ETCO2 and breath sounds checked- equal and bilateral ?Secured at: 20 cm ?Tube secured with: Tape ?Dental Injury: Teeth and Oropharynx as per pre-operative assessment  ? ? ? ? ?

## 2021-12-20 NOTE — Transfer of Care (Signed)
Immediate Anesthesia Transfer of Care Note ? ?Patient: Natasha Sawyer ? ?Procedure(s) Performed: Left reverse shoulder arthroplasty for proximal humerus fracture, biceps tenodesis (Left: Shoulder) ?Left reverse shoulder arthroplasty for proximal humerus fracture, biceps tenodesis (Left: Shoulder) ? ?Patient Location: PACU ? ?Anesthesia Type:General ? ?Level of Consciousness: sedated ? ?Airway & Oxygen Therapy: Patient Spontanous Breathing and Patient connected to face mask oxygen ? ?Post-op Assessment: Report given to RN and Post -op Vital signs reviewed and stable ? ?Post vital signs: Reviewed ? ?Last Vitals:  ?Vitals Value Taken Time  ?BP    ?Temp    ?Pulse 54 12/20/21 1719  ?Resp 20 12/20/21 1719  ?SpO2 99 % 12/20/21 1719  ?Vitals shown include unvalidated device data. ? ?Last Pain:  ?Vitals:  ? 12/20/21 1113  ?TempSrc: Oral  ?PainSc: 0-No pain  ?   ? ?  ? ?Complications: No notable events documented. ?

## 2021-12-20 NOTE — Anesthesia Preprocedure Evaluation (Signed)
Anesthesia Evaluation  ?Patient identified by MRN, date of birth, ID band ?Patient awake ? ? ? ?Reviewed: ?Allergy & Precautions, NPO status , Patient's Chart, lab work & pertinent test results ? ?History of Anesthesia Complications ?Negative for: history of anesthetic complications ? ?Airway ?Mallampati: II ? ?TM Distance: >3 FB ?Neck ROM: Full ? ? ? Dental ?no notable dental hx. ?(+) Teeth Intact, Caps ?  ?Pulmonary ?neg pulmonary ROS, neg sleep apnea, neg COPD, Patient abstained from smoking.Not current smoker,  ?  ?Pulmonary exam normal ?breath sounds clear to auscultation ? ? ? ? ? ? Cardiovascular ?Exercise Tolerance: Good ?METShypertension, Pt. on medications ?(-) CAD and (-) Past MI (-) dysrhythmias  ?Rhythm:Regular Rate:Normal ?- Systolic murmurs ?EKG sinus brady with PACs ?  ?Neuro/Psych ?negative neurological ROS ? negative psych ROS  ? GI/Hepatic ?neg GERD  ,(+)  ?  ? (-) substance abuse ? ,   ?Endo/Other  ?neg diabetes ? Renal/GU ?CRFRenal disease  ? ?  ?Musculoskeletal ? ?(+) Arthritis ,  ? Abdominal ?  ?Peds ? Hematology ?  ?Anesthesia Other Findings ?Past Medical History: ?No date: Arthritis ?No date: Ascending aorta dilation (HCC) ?No date: Chronic kidney disease ?No date: Dysrhythmia ?No date: Hyperlipidemia ?No date: Hypertension ?No date: Palpitations ?No date: Premature beats ? Reproductive/Obstetrics ? ?  ? ? ? ? ? ? ? ? ? ? ? ? ? ?  ?  ? ? ? ? ? ? ? ? ?Anesthesia Physical ?Anesthesia Plan ? ?ASA: 2 ? ?Anesthesia Plan: General  ? ?Post-op Pain Management: Regional block* and Ofirmev IV (intra-op)*  ? ?Induction: Intravenous ? ?PONV Risk Score and Plan: 3 and Ondansetron and Dexamethasone ? ?Airway Management Planned: Oral ETT ? ?Additional Equipment: None ? ?Intra-op Plan:  ? ?Post-operative Plan: Extubation in OR ? ?Informed Consent: I have reviewed the patients History and Physical, chart, labs and discussed the procedure including the risks, benefits and  alternatives for the proposed anesthesia with the patient or authorized representative who has indicated his/her understanding and acceptance.  ? ? ? ?Dental advisory given ? ?Plan Discussed with: CRNA and Surgeon ? ?Anesthesia Plan Comments: (Discussed risks of anesthesia with patient, including PONV, sore throat, lip/dental/eye damage. Rare risks discussed as well, such as cardiorespiratory and neurological sequelae, and allergic reactions. Discussed the role of CRNA in patient's perioperative care. Patient understands. ?Discussed r/b/a of interscalene block, including elective nature. Risks discussed: ?- Rare: bleeding, infection, nerve damage ?- shortness of breath from hemidiaphragmatic paralysis ?- unilateral horner's syndrome ?- poor/non-working blocks ?- reactions and toxicity to local anesthetic ?Patient understands and agrees. ?)  ? ? ? ? ? ? ?Anesthesia Quick Evaluation ? ?

## 2021-12-20 NOTE — Op Note (Signed)
Operative Note  ?  ?SURGERY DATE: 12/20/2021 ?  ?PRE-OP DIAGNOSIS:  ?1.  Left 4-part proximal humerus fracture ?  ?POST-OP DIAGNOSIS:  ?1.  Left 4-part proximal humerus fracture ?  ?PROCEDURES:  ?1. Left reverse total shoulder arthroplasty ?  ?SURGEON: Rosealee Albee, MD ? ?ASSISTANTS: Dedra Skeens, PA; Jon Gills, PA-S  ?  ?ANESTHESIA: Gen + interscalene block ?  ?ESTIMATED BLOOD LOSS: 150cc ?  ?TOTAL IV FLUIDS: see anesthesia record ? ?IMPLANTS: ?Tornier Perform 33mm Glenoid Baseplate with 35 mm central screw + 3 associated glenoid baseplate screws; Standard 22mm glenosphere; Aequalis Flex Revive 53mm Pres Fit stem with 31mm proximal body with 82mm spacer, High (3.36mm) Eccentric Reversed Tray; Reversed Insert +12mm ? ?INDICATION(S):  Natasha Sawyer is 81 y.o. female who sustained a significantly displaced 4-part proximal humerus fracture. After discussion of risks, benefits, and alternatives to surgery, the patient elected to proceed with reverse shoulder arthroplasty and biceps tenodesis. ?  ?OPERATIVE FINDINGS: 4-part proximal humerus fracture, proximal biceps tendon rupture ?  ?OPERATIVE REPORT:   ?I identified Natasha Sawyer in the pre-operative holding area. Informed consent was obtained and the surgical site was marked. I reviewed the risks and benefits of the proposed surgical intervention and the patient (and/or patient's guardian) wished to proceed. An interscalene block with Exparel was administered by the Anesthesia team. The patient was transferred to the operative suite and general anesthesia was administered. The patient was placed in the beach chair position with the head of the bed elevated approximately 30 degrees. All down side pressure points were appropriately padded. Appropriate IV antibiotics were administered. Tranexamic acid was also administered after verifying that the patient had no contraindications. The extremity was then prepped and draped in standard fashion. A time out was performed  confirming the correct extremity, correct patient, and correct procedure.  ? ?We used the standard deltopectoral incision from the coracoid to ~10cm distal.  Vancomycin powder was placed in the dermis.  We found the cephalic vein and took it laterally. We opened the deltopectoral interval widely and placed retractors under the CA ligament in the subacromial space and under the deltoid tendon at its insertion. We then abducted and internally rotated the arm and released the underlying bursa between these retractors, taking care not to damage the circumflex branch of the axillary nerve.  ? ?Next, we brought the arm back in adduction at slight forward flexion with external rotation. We opened the clavipectoral fascia lateral to the conjoint tendon.  The "3 sisters" were identified and coagulated.  The arm was then internally rotated, we cut the falciform ligament at approximately 1 cm of the upper portion of the pectoralis major insertion. Next we unroofed the bicipital groove. Biceps tendon was not identified, consistent with prior biceps tendon rupture.  ? ?FiberWire sutures were placed within the supraspinatus, infraspinatus, and subscapularis to obtain appropriate control of the tuberosities. The articular humeral head portion was removed.  Cancellous bone was removed from the removed humeral head to be used later as bone graft.  #5 Ethibond sutures were placed around the greater tuberosity at the bone-tendon junction.  Additionally, three X-Braid tape suture were placed around the greater tuberosity (two to serve as tuberosity to fin sutures and one to serve as an around the world suture) ? ?Next, A posterior retractor was used to retract greater tuberosity, exposing the glenoid.  The anterior capsule was separated from the subscapularis and excised from the lesser tuberosity to the glenoid.  The labrum and the  remnant of the biceps tendon were removed in a circumferential fashion.  The attachment of the triceps  muscle was released off of the inferior glenoid.  During the glenoid exposure, the axillary nerve was protected the entire time.  Appropriate retractors were placed and there was excellent glenoid visualization. ? ?The central guidepin was drilled with a 10 degree inferior tilt, correcting with mild retroversion. The glenoid was reamed in order to remove the cartilaginous surface without taking away too much bone.  Standard glenoid instrumentation was performed.  The baseplate was placed and achieved excellent press-fit such that on attempted further tightening, the entire scapula rotated.  The peripheral screws were drilled, measured, and placed.  Peripheral reamer was used to clear out any debris and allow for appropriate glenosphere placement.  Glenosphere was impacted and found to be firmly seated.  Glenosphere screw was tightened appropriately. ?  ?We then turned our attention to the humerus.  The humeral canal was sounded and sized appropriately until excellent diaphyseal fit was achieved.  Broaching was performed at ~20 degrees of retroversion.  Trial implants were placed.  The joint was reduced and noted to have satisfactory stability, motion, and deltoid tension with adequate tuberosity reduction. ? ?We drilled two holes into the shaft on either side of the bicipital groove ~2cm inferior to the humeral fracture line and passed two Xbraid tapes through both holes to serve as vertical tuberosity fixation sutures.  The previously passed Xbraid sutures in the greater tuberosity were passed through the medial and lateral suture holes in the stem.  The stem was then inserted into the appropriate position.  Excellent press-fit was achieved.  Reverse tray and poly insert were placed. ? ?Next, #5 Ethibond sutures from the greater tuberosity were passed around the lesser tuberosity to serve as horizontal cerclage sutures.  Xbraid tape through the medial implant was then passed around the lesser tuberosity as well to  serve as an around the world suture (greater tuberosity to stem to the lesser tuberosity).  Both vertical sutures were passed through the greater and lesser tuberosities.  Bone graft was placed between the tuberosities and the implant to help stimulate bone healing.  The greater tuberosity to implant XBraid sutures were tied first, securing the greater tuberosity to the implant.  Next, the horizontal cerclage Ethibond sutures were tied, and this was followed by the around the world XBraid suture. Finally, vertical sutures were tied. This achieved excellent stability of the tuberosities and they were stable to external and internal rotation.  ?  ?Final confirmation of motion, tension, and stability were satisfactory. The wound was thoroughly irrigated. Vancomycin powder was placed.  A Hemovac drain was placed.  ?  ?The deltopectoral interval was closed with a running, 0-Vicryl suture. The skin was closed with 2-0 Vicryl and staples. Xeroform and Honeycomb dressing were applied. A PolarCare unit and sling were placed. Patient was extubated, transferred to a stretcher bed and to the post anesthesia care unit in stable condition.  ?  ?Of note, assistance from a PA was essential to performing the surgery.  PA was present for the entire surgery.  PA assisted with patient positioning, retraction, instrumentation, and wound closure. The surgery would have been more difficult and had longer operative time without PA assistance.  ? ?Additionally, this case had significantly increased complexity and surgical time compared to standard reverse shoulder arthroplasty.  This was due to this being a 4 part proximal humerus fracture.  This caused significantly distorted anatomy which required more careful  and meticulous dissection, adding to surgical time.  Additionally, lesser and greater tuberosity repair required multiple sutures to be placed and tied.  Both of these factors added approximately 45 minutes to the surgical time  compared to that for a standard reverse shoulder arthroplasty. ? ? ?POSTOPERATIVE PLAN: ?Operative arm to remain in sling and abduction pillow with arm at neutral at all times except RoM exercises and hygiene.

## 2021-12-20 NOTE — Discharge Instructions (Signed)
Natasha AlbeeSunny H. [name], MD ? [facility] ? Phone: [phone] ? Fax: [phone] ? ? ?Discharge Instructions after Reverse Shoulder Replacement ? ?  ?1. Activity/Sling: You are to be non-weight bearing on operative extremity. A sling/shoulder immobilizer has been provided for you. Only remove the sling to perform elbow, wrist, and hand RoM exercises and hygiene/dressing. Active reaching and lifting are not permitted. You will be given further instructions on sling use at your first physical therapy visit and postoperative visit with Dr. Allena KatzPatel.  ? ?2. Dressings: Dressing may be removed after ~5 days after surgery. Afterwards, you may either leave open to air (if no drainage) or cover with dry, sterile dressing. If you have steri-strips on your wound, please do not remove them. They will fall off on their own. You may shower 5 days after surgery. Please pat incision dry. Do not rub or place any shear forces across incision. If there is drainage or any opening of incision after 5 days, please notify our offices immediately.  ?  ?3. Driving:  Plan on not driving for six weeks. Please note that you are advised NOT to drive while taking narcotic pain medications as you may be impaired and unsafe to drive. ?  ?4. Medications:  ?- You have been provided a prescription for narcotic pain medicine (usually oxycodone). After surgery, take 1-2 narcotic tablets every 4 hours if needed for severe pain. Please start this as soon as you begin to start having pain (if you received a nerve block, start taking as soon as this wears off).  ?- A prescription for anti-nausea medication will be provided in case the narcotic medicine causes nausea - take 1 tablet every 6 hours only if nauseated.  ?- Take enteric coated aspirin 325 mg once daily for 6 weeks to prevent blood clots. Do not take aspirin if you have an aspirin sensitivity/allergy or asthma or are on an anticoagulant (blood thinner) already. If so, then your home anticoagulant will  be resume and managed - do not take aspirin. ?-Take tylenol 1000mg  (2 Extra strength or 3 regular strength tablets) every 8 hours for pain. This will reduce the amount of narcotic medication needed. May stop tylenol when you are having minimal pain. ?- Take a stool softener (Colace, Dulcolax or Senakot) if you are using narcotic pain medications to help with constipation that is associated with narcotic use. ?- DO NOT take ANY nonsteroidal anti-inflammatory pain medications: ?Advil, Motrin, Ibuprofen, Aleve, Naproxen, or Naprosyn.  ? ?If you are taking prescription medication for anxiety, depression, insomnia, muscle spasm, chronic pain, or for attention deficit disorder you are advised that you are at a higher risk of adverse effects with use of narcotics post-op, including narcotic addiction/dependence, depressed breathing, death. ?If you use non-prescribed substances: alcohol, marijuana, cocaine, heroin, methamphetamines, etc., you are at a higher risk of adverse effects with use of narcotics post-op, including narcotic addiction/dependence, depressed breathing, death. ?You are advised that taking > 50 morphine milligram equivalents (MME) of narcotic pain medication per day results in twice the risk of overdose or death. For your prescription provided: oxycodone 5 mg - taking more than 6 tablets per day after the first few days of surgery. ?  ?5. Physical Therapy: 1-2 times per week for ~12 weeks. Therapy typically starts ~2 weeks after surgery. You have been provided an order for physical therapy. The therapist will provide home exercises. Please contact our offices if this appointment has not been scheduled.  ?  ?6. Work: May do light  duty/desk job in approximately 2 weeks when off of narcotics, pain is well-controlled, and swelling has decreased if able to function with one arm in sling. Full work may take 6 weeks if light motions and function of both arms is required. Lifting jobs may require 12 weeks. ?  ?7.  Post-Op Appointments: ?Your first post-op appointment will be with Dr. Allena Katz in approximately 2 weeks time.  ?  ?If you find that they have not been scheduled please call the Orthopaedic Appointment front desk at [phone]. ? ? ? ? ? ? ? ? ? ? ? ? ? ? ? ? ? ? ? ? ? ? ? ? ? ? ? ? ? ? ?Natasha Albee, MD ?[facility] ?Phone: [phone] ?Fax: [phone] ? ? ?REVERSE SHOULDER ARTHROPLASTY REHAB GUIDELINES ? ? ?These guidelines should be tailored to individual patients based on their rehab goals, age, precautions, quality of repair, etc.  Progression should be based on patient progress and approval by the referring physician. ? ?PHASE 1 - Day 1 through Week 2 ? ?GENERAL GUIDELINES AND PRECAUTIONS ?Sling wear 24/7 except during grooming and home exercises (3 to 5 times daily) ?Avoid shoulder extension such that the arm is posterior the frontal plane.  When patients recline, a pillow should be placed behind the upper arm and sling should be on.  They should be advised to always be able to see the elbow ?Avoid combined IR/ADD/EXT, such as hand behind back to prevent dislocation ?Avoid combined IR and ADD such as reaching across the chest to prevent dislocation ?No AROM ?No submersion in pool/water for 4 weeks ?No weight bearing through operative arm (as in transfers, walker use, etc?) ? ?GOALS ?Maintain integrity of joint replacement; protect soft tissue healing ?Increase PROM for elevation to 120 and ER to 30 (will remain the goal for first 6 weeks) ?Optimize distal UE circulation and muscle activity (elbow, wrist and hand) ?Instruct in use of sling for proper fit, polar care device for ice application after HEP, signs/symptoms of infection ? ?EXERCISES ?Active elbow, wrist and hand ?Passive forward elevation in scapular plane to 90-120 max motion; ER in scapular plane to 30 ?Active scapular retraction with arms resting in neutral position ? ?CRITERIA TO PROGRESS TO PHASE 2 ?Low pain (less than 3/10) with  shoulder PROM ?Healing of incision without signs of infection ?Clearance by MD to advance after 2 week MD check up ? ?PHASE 2 - 2 weeks - 6 weeks ? ?GENERAL GUIDELINES AND PRECAUTIONS ?Sling may be removed while at home; worn in community without abduction pillow ?May use arm for light activities of daily living (such as feeding, brushing teeth, dressing?) with elbow near  the side of the body  and arm in front of the body- no active lifting of the arm ?May submerge in water (tub, pool, [place], etc?) after 4 weeks ?Continue to avoid WBing through the operative arm ?Continue to avoid combined IR/EXT/ADD (hand behind the back) and IR/ADD  (reaching across chest) for dislocation precautions ? ?GOALS ? Achieve passive elevation to 120 and ER to 30 ? Low (less than 3/10) to no pain ? Ability to fire all heads of the deltoid ? ?EXERCISES ?May discontinue grip, and active elbow and wrist exercises since using the arm in ADL?s  with sling removed around the home ?Continue passive elevation to 120 and ER to 30, both in scapular plane with arm supported on table top ?Add submaximal isometrics, pain free effort, for all functional heads of deltoid (anterior,  posterior, middle)  Ensure that with posterior deltoid isometric the shoulder does not move into extension and the arm remains anterior the frontal plane ?At 4 weeks:  begin to place arm in balanced position of 90 deg elevation in supine; when patient able to hold this position with ease, may begin reverse pendulums clockwise and counterclockwise ? ?CRITERIA TO PROGRESS TO PHASE 3 ?Passive forward elevation in scapular plane to 120; passive ER in scapular plane to 30 ?Ability to fire isometrically all heads of the deltoid muscle without pain ?Ability to place and hold the arm in balanced position (90 deg elevation in supine) ? ?PHASE 3 - 6 weeks to 3 months ? ?GENERAL GUIDELINES AND PRECAUTIONS ?Discontinue use of sling ?Avoid forcing end range motion in any direction to  prevent dislocation  ?May advance use of the arm actively in ADL?s without being restricted to arm by the side of the body, however, avoid heavy lifting and sports (forever!) ?May initiate functional IR b

## 2021-12-21 LAB — CBC
HCT: 28.9 % — ABNORMAL LOW (ref 36.0–46.0)
Hemoglobin: 9.4 g/dL — ABNORMAL LOW (ref 12.0–15.0)
MCH: 29.9 pg (ref 26.0–34.0)
MCHC: 32.5 g/dL (ref 30.0–36.0)
MCV: 92 fL (ref 80.0–100.0)
Platelets: 393 10*3/uL (ref 150–400)
RBC: 3.14 MIL/uL — ABNORMAL LOW (ref 3.87–5.11)
RDW: 15 % (ref 11.5–15.5)
WBC: 13.4 10*3/uL — ABNORMAL HIGH (ref 4.0–10.5)
nRBC: 0 % (ref 0.0–0.2)

## 2021-12-21 LAB — BASIC METABOLIC PANEL
Anion gap: 16 — ABNORMAL HIGH (ref 5–15)
BUN: 28 mg/dL — ABNORMAL HIGH (ref 8–23)
CO2: 18 mmol/L — ABNORMAL LOW (ref 22–32)
Calcium: 9.1 mg/dL (ref 8.9–10.3)
Chloride: 105 mmol/L (ref 98–111)
Creatinine, Ser: 1.16 mg/dL — ABNORMAL HIGH (ref 0.44–1.00)
GFR, Estimated: 48 mL/min — ABNORMAL LOW (ref >60)
Glucose, Bld: 138 mg/dL — ABNORMAL HIGH (ref 70–99)
Potassium: 4.6 mmol/L (ref 3.5–5.1)
Sodium: 139 mmol/L (ref 135–145)

## 2021-12-21 LAB — URINE CULTURE: Culture: >=100000 — AB

## 2021-12-21 MED ORDER — ONDANSETRON HCL 4 MG PO TABS: 4.0000 mg | ORAL_TABLET | Freq: Four times a day (QID) | ORAL | 0 refills | Status: DC | PRN

## 2021-12-21 MED ORDER — OXYCODONE HCL 5 MG PO TABS: 2.5000 mg | ORAL_TABLET | ORAL | 0 refills | Status: DC | PRN

## 2021-12-21 MED ORDER — ASPIRIN 325 MG PO TBEC: 325.0000 mg | DELAYED_RELEASE_TABLET | Freq: Every day | ORAL | 0 refills | Status: DC

## 2021-12-21 NOTE — Progress Notes (Signed)
Physical Therapy Treatment ?Patient Details ?Name: Natasha Sawyer ?MRN: [id number] ?DOB: 10-01-1940 ?Today's Date: 12/21/2021 ? ? ?History of Present Illness Natasha Sawyer is a 81 y.o. female with past medical history of HTN, HDL, PVCs and CKD who presents accompanied by family for evaluation of left shoulder pain after a fall. Found to have a significantly displaced 4-part proximal humerus fracture. S/p Left reverse total shoulder arthroplasty on 12/20/21. ? ?  ?PT Comments  ? ? Pt received in Semi-Fowler's position and agreeable to therapy.  Pt continues to need minA to come upright from bed into sitting due to the L UE being NWB.  Pt then able to ambulate around the nursing station x2, however does tend to take short quick steps when fatigued.  Pt then transferred back to bed with all needs met and call bell within reach.  Current discharge plans to SNF remain appropriate at this time.  Pt will continue to benefit from skilled therapy in order to address deficits listed below. ? ?  ?Recommendations for follow up therapy are one component of a multi-disciplinary discharge planning process, led by the attending physician.  Recommendations may be updated based on patient status, additional functional criteria and insurance authorization. ? ?Follow Up Recommendations ? Skilled nursing-short term rehab (<3 hours/day) ?  ?  ?Assistance Recommended at Discharge Frequent or constant Supervision/Assistance  ?Patient can return home with the following A little help with walking and/or transfers;A little help with bathing/dressing/bathroom ?  ?Equipment Recommendations ? None recommended by PT  ?  ?Recommendations for Other Services   ? ? ?  ?Precautions / Restrictions Precautions ?Precautions: Fall;Shoulder ?Shoulder Interventions: Shoulder sling/immobilizer;Shoulder abduction pillow;Off for dressing/bathing/exercises ?Restrictions ?Weight Bearing Restrictions: Yes ?LUE Weight Bearing: Non weight bearing  ?  ? ?Mobility ? Bed  Mobility ?Overal bed mobility: Needs Assistance ?Bed Mobility: Supine to Sit ?  ?  ?Supine to sit: Min assist ?  ?  ?General bed mobility comments: Pt requiring unilatral UE support to come upright out of bed. ?  ? ?Transfers ?Overall transfer level: Needs assistance ?Equipment used: 1 person hand held assist ?Transfers: Sit to/from Stand ?Sit to Stand: Min guard ?  ?  ?  ?  ?  ?  ?  ? ?Ambulation/Gait ?Ambulation/Gait assistance: Min guard ?Gait Distance (Feet): 320 Feet ?Assistive device: None ?Gait Pattern/deviations: Step-through pattern, Decreased stride length, Drifts right/left ?Gait velocity: decreased ?  ?  ?General Gait Details: Pt with some mild unsteadiness when ambulating at times; takes short steps when she fatigues. ? ? ?Stairs ?  ?  ?  ?  ?  ? ? ?Wheelchair Mobility ?  ? ?Modified Rankin (Stroke Patients Only) ?  ? ? ?  ?Balance Overall balance assessment: Needs assistance ?Sitting-balance support: No upper extremity supported, Feet supported ?Sitting balance-Leahy Scale: Good ?  ?  ?Standing balance support: No upper extremity supported, During functional activity ?Standing balance-Leahy Scale: Fair ?  ?  ?  ?  ?  ?  ?  ?  ?  ?  ?  ?  ?  ? ?  ?Cognition Arousal/Alertness: Awake/alert ?Behavior During Therapy: [facility] for tasks assessed/performed ?Overall Cognitive Status: Within Functional Limits for tasks assessed ?  ?  ?  ?  ?  ?  ?  ?  ?  ?  ?  ?  ?  ?  ?  ?  ?  ?  ?  ? ?  ?Exercises Total Joint Exercises ?Ankle Circles/Pumps: AROM, Strengthening, Both, 10 reps,  Seated ?Long Arc Quad: AROM, Strengthening, Both, 10 reps, Seated ?Marching in Standing: AROM, Strengthening, Both, 10 reps, Seated ?Other Exercises ?Other Exercises: Pt and daughter educated on roles of PT and services provided during hospital stay.  Pt also educated on current NWB precautions and rehab options following hospital stay. ? ?  ?General Comments   ?  ?  ? ?Pertinent Vitals/Pain Pain Assessment ?Pain Assessment: Faces ?Pain  Score: 5  ?Pain Location: back ?Pain Descriptors / Indicators: Aching, Discomfort, Dull ?Pain Intervention(s): Limited activity within patient's tolerance, Monitored during session, Premedicated before session, Repositioned  ? ? ?Home Living Family/patient expects to be discharged to:: Private residence ?Living Arrangements: Alone ?Available Help at Discharge: Personal care attendant;Available PRN/intermittently ?Type of Home: Independent living facility ?Home Access: Level entry ?  ?  ?  ?Home Layout: One level ?Home Equipment: None ?Additional Comments: [facility]  ?  ?Prior Function    ?  ?  ?   ? ?PT Goals (current goals can now be found in the care plan section) Acute Rehab PT Goals ?Patient Stated Goal: to go to rehab to get stronger. ?PT Goal Formulation: With patient/family ?Time For Goal Achievement: 01/04/22 ?Potential to Achieve Goals: Good ?Progress towards PT goals: Progressing toward goals ? ?  ?Frequency ? ? ? BID ? ? ? ?  ?PT Plan    ? ? ?Co-evaluation   ?  ?  ?  ?  ? ?  ?AM-PAC PT "6 Clicks" Mobility   ?Outcome Measure ? Help needed turning from your back to your side while in a flat bed without using bedrails?: A Little ?Help needed moving from lying on your back to sitting on the side of a flat bed without using bedrails?: A Little ?Help needed moving to and from a bed to a chair (including a wheelchair)?: A Little ?Help needed standing up from a chair using your arms (e.g., wheelchair or bedside chair)?: A Little ?Help needed to walk in hospital room?: A Little ?Help needed climbing 3-5 steps with a railing? : A Little ?6 Click Score: 18 ? ?  ?End of Session Equipment Utilized During Treatment: Gait belt ?Activity Tolerance: Patient tolerated treatment well ?Patient left: in chair;with call bell/phone within reach;with chair alarm set;with family/visitor present ?Nurse Communication: Mobility status ?PT Visit Diagnosis: Unsteadiness on feet (R26.81);Muscle weakness (generalized) (M62.81) ?   ? ? ?Time: 0786-7544 ?PT Time Calculation (min) (ACUTE ONLY): 12 min ? ?Charges:  $Gait Training: 8-22 mins ?$Therapeutic Exercise: 8-22 mins          ?          ? ?Gwenlyn Saran, PT, DPT ?12/21/21, 4:51 PM ? ? ? ?Christie Nottingham ?12/21/2021, 4:50 PM ? ?

## 2021-12-21 NOTE — Progress Notes (Signed)
?  Subjective: ?1 Day Post-Op Procedure(s) (LRB): ?Left reverse shoulder arthroplasty for proximal humerus fracture, biceps tenodesis (Left) ?Left reverse shoulder arthroplasty for proximal humerus fracture, biceps tenodesis (Left) ?Patient reports pain as mild.   ?Patient is well, and has had no acute complaints or problems ?Plan is to go Rehab after hospital stay. ?Negative for chest pain and shortness of breath ?Fever: no ?Gastrointestinal: Negative for nausea and vomiting ? ?Objective: ?Vital signs in last 24 hours: ?Temp:  [95.5 ?F (35.3 ?C)-98.3 ?F (36.8 ?C)] 97.7 ?F (36.5 ?C) (04/07 0443) ?Pulse Rate:  [26-113] 90 (04/07 0443) ?Resp:  [18-22] 18 (04/07 0443) ?BP: (107-192)/(49-73) 123/65 (04/07 0443) ?SpO2:  [92 %-100 %] 93 % (04/07 0443) ?Weight:  [70 kg] 70 kg (04/06 1113) ? ?Intake/Output from previous day: ? ?Intake/Output Summary (Last 24 hours) at 12/21/2021 0706 ?Last data filed at 12/21/2021 2706 ?Gross per 24 hour  ?Intake 1000 ml  ?Output 150 ml  ?Net 850 ml  ?  ?Intake/Output this shift: ?No intake/output data recorded. ? ?Labs: ?Recent Labs  ?  12/19/21 ?1500 12/21/21 ?0506  ?HGB 9.7* 9.4*  ? ?Recent Labs  ?  12/19/21 ?1500 12/21/21 ?0506  ?WBC 9.0 13.4*  ?RBC 3.29* 3.14*  ?HCT 30.6* 28.9*  ?PLT 406* 393  ? ?Recent Labs  ?  12/19/21 ?1434 12/21/21 ?0506  ?NA 136 139  ?K 4.3 4.6  ?CL 104 105  ?CO2 21* 18*  ?BUN 21 28*  ?CREATININE 1.14* 1.16*  ?GLUCOSE 95 138*  ?CALCIUM 9.2 9.1  ? ?No results for input(s): LABPT, INR in the last 72 hours. ? ? ?EXAM ?General - Patient is Alert and Oriented ?Extremity - Neurovascular intact ?Sensation intact distally ?Dressing/Incision - clean, dry, with the Hemovac removed with no complication ?Motor Function - intact, moving fingers well on exam.  Grip strength intact ? ?Past Medical History:  ?Diagnosis Date  ? Arthritis   ? Ascending aorta dilation (HCC)   ? Chronic kidney disease   ? Dysrhythmia   ? Hyperlipidemia   ? Hypertension   ? Palpitations   ? Premature beats    ? ? ?Assessment/Plan: ?1 Day Post-Op Procedure(s) (LRB): ?Left reverse shoulder arthroplasty for proximal humerus fracture, biceps tenodesis (Left) ?Left reverse shoulder arthroplasty for proximal humerus fracture, biceps tenodesis (Left) ?Principal Problem: ?  Closed 4-part fracture of proximal humerus with malunion ? ?Estimated body mass index is 31.17 kg/m? as calculated from the following: ?  Height as of this encounter: 4\' 11"  (1.499 m). ?  Weight as of this encounter: 70 kg. ?Advance diet ?Up with therapy ?D/C IV fluids ? ?Discharge planning to go to [facility], possible this weekend ? ?DVT Prophylaxis - Aspirin ?Left arm in the shoulder immobilizer at all times except for bathing and physical therapy. ? ?[facility], PA-C ?Orthopaedic Surgery ?12/21/2021, 7:06 AM ? ?

## 2021-12-21 NOTE — Evaluation (Signed)
Physical Therapy Evaluation ?Patient Details ?Name: Natasha Sawyer ?MRN: [id number] ?DOB: 07-Oct-1940 ?Today's Date: 12/21/2021 ? ?History of Present Illness ? Natasha Sawyer is a 81 y.o. female with past medical history of HTN, HDL, PVCs and CKD who presents accompanied by family for evaluation of left shoulder pain after a fall. Found to have a significantly displaced 4-part proximal humerus fracture. S/p Left reverse total shoulder arthroplasty on 12/20/21. ?  ?Clinical Impression ? Pt received seated in recliner upon arrival to room and pt agreeable to therapy.  Pt able to participate in seated exercises as listed below without much difficulty.  Pt then transferred to standing and able to ambulate without an AD, however has some unsteadiness to her ambulation and requires close CGA to prevent any LOB.  Pt transferred back to recliner after ambulating around the nursing station.  All needs within reach and daughter in room with pt at conclusion of session.  Due to lack of caregiver support and management of bathroom, and other transfers, pt would benefit from short term rehab at [facility].  Pt and daughter agreeable to this recommendation at this time.   ?   ?   ? ?Recommendations for follow up therapy are one component of a multi-disciplinary discharge planning process, led by the attending physician.  Recommendations may be updated based on patient status, additional functional criteria and insurance authorization. ? ?Follow Up Recommendations Skilled nursing-short term rehab (<3 hours/day) ? ?  ?Assistance Recommended at Discharge Frequent or constant Supervision/Assistance  ?Patient can return home with the following ? A little help with walking and/or transfers;A little help with bathing/dressing/bathroom ? ?  ?Equipment Recommendations None recommended by PT  ?Recommendations for Other Services ?    ?  ?Functional Status Assessment Patient has had a recent decline in their functional status and demonstrates the ability to  make significant improvements in function in a reasonable and predictable amount of time.  ? ?  ?Precautions / Restrictions Precautions ?Precautions: Fall;Shoulder ?Shoulder Interventions: Shoulder sling/immobilizer;Shoulder abduction pillow;Off for dressing/bathing/exercises ?Restrictions ?Weight Bearing Restrictions: Yes ?LUE Weight Bearing: Non weight bearing  ? ?  ? ?Mobility ? Bed Mobility ?  ?  ?  ?  ?  ?  ?  ?General bed mobility comments: Pt upright in recliner and returned to recliner at conclusion ?  ? ?Transfers ?Overall transfer level: Needs assistance ?Equipment used: None ?Transfers: Sit to/from Stand ?Sit to Stand: Min guard ?  ?  ?  ?  ?  ?  ?  ? ?Ambulation/Gait ?Ambulation/Gait assistance: Min guard ?Gait Distance (Feet): 160 Feet ?Assistive device: None ?Gait Pattern/deviations: Step-through pattern, Decreased stride length ?Gait velocity: decreased ?  ?  ?General Gait Details: Pt with some mild unsteadiness when ambulating at times. ? ?Stairs ?  ?  ?  ?  ?  ? ?Wheelchair Mobility ?  ? ?Modified Rankin (Stroke Patients Only) ?  ? ?  ? ?Balance Overall balance assessment: Needs assistance ?Sitting-balance support: No upper extremity supported, Feet supported ?Sitting balance-Leahy Scale: Good ?  ?  ?Standing balance support: No upper extremity supported, During functional activity ?Standing balance-Leahy Scale: Fair ?  ?  ?  ?  ?  ?  ?  ?  ?  ?  ?  ?  ?   ? ? ? ?Pertinent Vitals/Pain Pain Assessment ?Pain Assessment: 0-10 ?Pain Score: 5  ?Pain Location: back ?Pain Descriptors / Indicators: Aching, Discomfort, Dull ?Pain Intervention(s): Limited activity within patient's tolerance, Premedicated before session  ? ? ?  Home Living Family/patient expects to be discharged to:: Private residence ?Living Arrangements: Alone ?Available Help at Discharge: Personal care attendant;Available PRN/intermittently ?Type of Home: Independent living facility ?Home Access: Level entry ?  ?  ?  ?Home Layout: One  level ?Home Equipment: None ?Additional Comments: [facility]  ?  ?Prior Function Prior Level of Function : Independent/Modified Independent;Driving ?  ?  ?  ?  ?  ?  ?  ?  ?  ? ? ?Hand Dominance  ? Dominant Hand: Right ? ?  ?Extremity/Trunk Assessment  ? Upper Extremity Assessment ?Upper Extremity Assessment: LUE deficits/detail ?LUE: Unable to fully assess due to immobilization ?  ? ?Lower Extremity Assessment ?Lower Extremity Assessment: Generalized weakness ?  ? ?   ?Communication  ? Communication: No difficulties  ?Cognition Arousal/Alertness: Awake/alert ?Behavior During Therapy: [facility] for tasks assessed/performed ?Overall Cognitive Status: Within Functional Limits for tasks assessed ?  ?  ?  ?  ?  ?  ?  ?  ?  ?  ?  ?  ?  ?  ?  ?  ?  ?  ?  ? ?  ?General Comments   ? ?  ?Exercises Total Joint Exercises ?Ankle Circles/Pumps: AROM, Strengthening, Both, 10 reps, Seated ?Long Arc Quad: AROM, Strengthening, Both, 10 reps, Seated ?Marching in Standing: AROM, Strengthening, Both, 10 reps, Seated ?Other Exercises ?Other Exercises: Pt and daughter educated on roles of PT and services provided during hospital stay.  Pt also educated on current NWB precautions and rehab options following hospital stay.  ? ?Assessment/Plan  ?  ?PT Assessment Patient needs continued PT services  ?PT Problem List Decreased strength;Decreased balance ? ?   ?  ?PT Treatment Interventions DME instruction;Gait training;Stair training;Functional mobility training;Therapeutic activities;Therapeutic exercise;Balance training;Neuromuscular re-education   ? ?PT Goals (Current goals can be found in the Care Plan section)  ?  ? ?  ?Frequency BID ?  ? ? ?Co-evaluation   ?  ?  ?  ?  ? ? ?  ?AM-PAC PT "6 Clicks" Mobility  ?Outcome Measure Help needed turning from your back to your side while in a flat bed without using bedrails?: A Little ?Help needed moving from lying on your back to sitting on the side of a flat bed without using bedrails?: A  Little ?Help needed moving to and from a bed to a chair (including a wheelchair)?: A Little ?Help needed standing up from a chair using your arms (e.g., wheelchair or bedside chair)?: A Little ?Help needed to walk in hospital room?: A Little ?Help needed climbing 3-5 steps with a railing? : A Little ?6 Click Score: 18 ? ?  ?End of Session Equipment Utilized During Treatment: Gait belt ?Activity Tolerance: Patient tolerated treatment well ?Patient left: in chair;with call bell/phone within reach;with chair alarm set;with family/visitor present ?Nurse Communication: Mobility status ?PT Visit Diagnosis: Unsteadiness on feet (R26.81);Muscle weakness (generalized) (M62.81) ?  ? ?Time: 0947-0962 ?PT Time Calculation (min) (ACUTE ONLY): 23 min ? ? ?Charges:   PT Evaluation ?$PT Eval Low Complexity: 1 Low ?PT Treatments ?$Therapeutic Exercise: 8-22 mins ?  ?   ? ? ?Nolon Bussing, PT, DPT ?12/21/21, 2:41 PM ? ? ?Phineas Real ?12/21/2021, 2:41 PM ? ?

## 2021-12-21 NOTE — Discharge Summary (Signed)
Physician Discharge Summary  ?Subjective: ?4 Days Post-Op Procedure(s) (LRB): ?Left reverse shoulder arthroplasty for proximal humerus fracture, biceps tenodesis (Left) ?Left reverse shoulder arthroplasty for proximal humerus fracture, biceps tenodesis (Left) ?Patient reports pain as mild.   ?Patient seen in rounds with Dr. Posey Pronto. ?Patient is well, and has had no acute complaints or problems ?Patient is ready to go to rehab after physical therapy.  She lives at [facility] and would like to go to the [facility] skilled nursing or rehab center. ? ?Physician Discharge Summary  ?Patient ID: ?Natasha Sawyer ?MRN: [id number] ?DOB/AGE: 1941-07-26 81 y.o. ? ?Admit date: 12/20/2021 ?Discharge date: 12/24/2021 ? ?Admission Diagnoses: ? ?Discharge Diagnoses:  ?Principal Problem: ?  Closed 4-part fracture of proximal humerus with malunion ? ? ?Discharged Condition: fair ? ?Hospital Course: The patient is postop from a left four-part proximal humerus fracture to a reverse shoulder replacement.  She is doing well since surgery.  She has not had any nausea.  Her vitals have remained stable.  The patient has been up to the bedside commode.  She has been working with physical therapy and has ambulated the patient was able to do physical therapy through the weekend.  She ambulated around the nurses station.  Her pain has become more manageable.  She was able to have a bowel movement.  Her vitals have remained stable.  She is ready to go to skilled nursing at [facility]. ? ?Treatments: surgery:  ?1. Left reverse total shoulder arthroplasty ?  ?SURGEON: Cato Mulligan, MD ?  ?ASSISTANTS: Reche Dixon, PA; Turner Daniels, PA-S  ?  ?ANESTHESIA: Gen + interscalene block ?  ?ESTIMATED BLOOD LOSS: 150cc ?  ?TOTAL IV FLUIDS: see anesthesia record ?  ?IMPLANTS: ?Tornier Perform 54mm Glenoid Baseplate with 35 mm central screw + 3 associated glenoid baseplate screws; Standard 53mm glenosphere; Aequalis Flex Revive 73mm Pres Fit stem with 45mm proximal  body with 76mm spacer, High (3.33mm) Eccentric Reversed Tray; Reversed Insert +49mm ? ?Discharge Exam: ?Blood pressure 125/68, pulse (!) 55, temperature 98.5 ?F (36.9 ?C), temperature source Oral, resp. rate 18, height 4\' 11"  (1.499 m), weight 70 kg, SpO2 94 %. ? ? ?Disposition: Discharge disposition: 03-Skilled Nursing Facility ? ? ? ? ? ? ? ?Allergies as of 12/24/2021   ?No Known Allergies ?  ? ?  ?Medication List  ?  ? ?TAKE these medications   ? ?acetaminophen 650 MG CR tablet ?Commonly known as: TYLENOL ?Take 650 mg by mouth every 8 (eight) hours as needed for pain. ?  ?amLODipine 10 MG tablet ?Commonly known as: NORVASC ?Take 10 mg by mouth in the morning. ?  ?aspirin 325 MG EC tablet ?Take 1 tablet (325 mg total) by mouth daily. ?  ?B-12 PO ?Take 1,000 mcg by mouth in the morning. ?  ?bisacodyl 5 MG EC tablet ?Commonly known as: DULCOLAX ?Take 2 tablets (10 mg total) by mouth daily as needed for moderate constipation. ?  ?enalapril 20 MG tablet ?Commonly known as: VASOTEC ?Take 20 mg by mouth in the morning. ?  ?ezetimibe-simvastatin 10-20 MG tablet ?Commonly known as: VYTORIN ?TAKE 1 TABLET AT BEDTIME ?What changed: when to take this ?  ?ondansetron 4 MG tablet ?Commonly known as: ZOFRAN ?Take 1 tablet (4 mg total) by mouth every 6 (six) hours as needed for nausea. ?  ?oxyCODONE 5 MG immediate release tablet ?Commonly known as: Oxy IR/ROXICODONE ?Take 0.5-1 tablets (2.5-5 mg total) by mouth every 4 (four) hours as needed for moderate pain (pain  score 4-6). ?  ?propranolol 10 MG tablet ?Commonly known as: INDERAL ?Take 10 mg by mouth daily as needed (palpitations.). ?  ?Vitamin D3 50 MCG (2000 UT) Tabs ?Take 2,000 Units by mouth in the morning. ?  ?zolpidem 5 MG tablet ?Commonly known as: AMBIEN ?Take 2.5 mg by mouth at bedtime as needed for sleep. ?  ? ?  ? ? Follow-up Information   ? ? Leim Fabry, MD. Go in 2 week(s).   ?Specialty: Orthopedic Surgery ?Why: For staple removal and x-rays ?Contact  information: ?[address] [place] ?[place] [address] ?[phone] ? ? ?  ?  ? ?  ?  ? ?  ? ? ?Signed: ?Noemie Devivo ?12/24/2021, 7:03 AM ? ? ?Objective: ?Vital signs in last 24 hours: ?Temp:  [98.1 ?F (36.7 ?C)-98.6 ?F (37 ?C)] 98.5 ?F (36.9 ?C) (04/10 0407) ?Pulse Rate:  [49-68] 55 (04/10 0407) ?Resp:  [16-18] 18 (04/10 0407) ?BP: (110-125)/(60-69) 125/68 (04/10 0407) ?SpO2:  [91 %-97 %] 94 % (04/10 0407) ? ?Intake/Output from previous day: ?No intake or output data in the 24 hours ending 12/24/21 0703 ?  ?Intake/Output this shift: ?No intake/output data recorded. ? ?Labs: ?No results for input(s): HGB in the last 72 hours. ? ?No results for input(s): WBC, RBC, HCT, PLT in the last 72 hours. ? ?No results for input(s): NA, K, CL, CO2, BUN, CREATININE, GLUCOSE, CALCIUM in the last 72 hours. ? ?No results for input(s): LABPT, INR in the last 72 hours. ? ?EXAM: ?General - Patient is Alert and Oriented ?Extremity - Neurovascular intact ?Sensation intact distally ?Incision - clean, dry, with the Hemovac removed ?Motor Function -grip strength intact.  Deltoid strength intact. ? ?Assessment/Plan: ?4 Days Post-Op Procedure(s) (LRB): ?Left reverse shoulder arthroplasty for proximal humerus fracture, biceps tenodesis (Left) ?Left reverse shoulder arthroplasty for proximal humerus fracture, biceps tenodesis (Left) ?Procedure(s) (LRB): ?Left reverse shoulder arthroplasty for proximal humerus fracture, biceps tenodesis (Left) ?Left reverse shoulder arthroplasty for proximal humerus fracture, biceps tenodesis (Left) ?Past Medical History:  ?Diagnosis Date  ? Arthritis   ? Ascending aorta dilation (HCC)   ? Chronic kidney disease   ? Dysrhythmia   ? Hyperlipidemia   ? Hypertension   ? Palpitations   ? Premature beats   ? ?Principal Problem: ?  Closed 4-part fracture of proximal humerus with malunion ? ?Estimated body mass index is 31.17 kg/m? as calculated from the following: ?  Height as of this encounter: 4\' 11"  (1.499  m). ?  Weight as of this encounter: 70 kg. ?Advance diet ?Up with therapy ?D/C IV fluids ?Diet - Regular diet ?Follow up - in 2 weeks ?Activity -shoulder immobilizer to left arm at all times except for bathing and physical therapy ?Disposition - Rehab ?Condition Upon Discharge - Stable ?DVT Prophylaxis - Aspirin ? ?Reche Dixon, PA-C ?Orthopaedic Surgery ?12/24/2021, 7:03 AM ? ?

## 2021-12-21 NOTE — Evaluation (Signed)
Occupational Therapy Evaluation ?Patient Details ?Name: Natasha Sawyer ?MRN: [id number] ?DOB: [birth year] ?Today's Date: 12/21/2021 ? ? ?History of Present Illness Natasha Sawyer is a 81 y.o. female with past medical history of HTN, HDL, PVCs and CKD who presents accompanied by family for evaluation of left shoulder pain after a fall. Found to have a significantly displaced 4-part proximal humerus fracture. S/p Left reverse total shoulder arthroplasty on 12/20/21.  ? ?Clinical Impression ?  ?Natasha Sawyer was seen for OT evaluation this date. Prior to hospital admission, pt was Independent for mobility and I/ADLs. Pt lives alone at [place]. Pt presents to acute OT demonstrating impaired ADL performance and functional mobility 2/2 decreased activity tolerance and impaired functional use of non dominant LUE. Pt currently requires MAX A don/doff sling/gown/polar care seated EOC. MIN A + HHA toilet t/f, SUPERVISION perihygiene in sitting. Limited by positive orthostatics, improved with sitting. MOD A don/doff underwear. CGA tooth brushing standing sink side. Pt would benefit from skilled OT to address noted impairments and functional limitations (see below for any additional details). Upon hospital discharge, recommend STR to maximize pt safety and return to PLOF. ? ?Orthostatic Vitals  ?Lying: BP 125/72, MAP 89, HR 88 ?Sitting: BP 120/72, MAP 87, HR 96 ?Standing: BP 103/61, MAP 76, HR 104, +lightheadedness ?3 min Standing: BP 106/50, MAP 66, HR 106, +dizzy and lightheaded  ? ?Recommendations for follow up therapy are one component of a multi-disciplinary discharge planning process, led by the attending physician.  Recommendations may be updated based on patient status, additional functional criteria and insurance authorization.  ? ?Follow Up Recommendations ? Skilled nursing-short term rehab (<3 hours/day)  ?  ?Assistance Recommended at Discharge Intermittent Supervision/Assistance  ?Patient can return home with the following  A little help with walking and/or transfers;A lot of help with bathing/dressing/bathroom ? ?  ?Functional Status Assessment ? Patient has had a recent decline in their functional status and demonstrates the ability to make significant improvements in function in a reasonable and predictable amount of time.  ?Equipment Recommendations ? BSC/3in1  ?  ?Recommendations for Other Services   ? ? ?  ?Precautions / Restrictions Precautions ?Precautions: Fall;Shoulder ?Shoulder Interventions: Shoulder sling/immobilizer;Shoulder abduction pillow;Off for dressing/bathing/exercises ?Restrictions ?Weight Bearing Restrictions: Yes ?LUE Weight Bearing: Non weight bearing  ? ?  ? ?Mobility Bed Mobility ?Overal bed mobility: Needs Assistance ?Bed Mobility: Supine to Sit ?  ?  ?Supine to sit: Min assist ?  ?  ?  ?  ? ?Transfers ?Overall transfer level: Needs assistance ?Equipment used: 1 person hand held assist ?Transfers: Sit to/from Stand ?Sit to Stand: Min guard ?  ?  ?  ?  ?  ?  ?  ? ?  ?Balance Overall balance assessment: Needs assistance ?Sitting-balance support: No upper extremity supported, Feet supported ?Sitting balance-Leahy Scale: Good ?  ?  ?Standing balance support: No upper extremity supported, During functional activity ?Standing balance-Leahy Scale: Fair ?  ?  ?  ?  ?  ?  ?  ?  ?  ?  ?  ?  ?   ? ?ADL either performed or assessed with clinical judgement  ? ?ADL Overall ADL's : Needs assistance/impaired ?  ?  ?  ?  ?  ?  ?  ?  ?  ?  ?  ?  ?  ?  ?  ?  ?  ?  ?  ?General ADL Comments: MAX A don/doff sling/gown/polar care seated EOC. MIN A + HHA toilet  t/f, SUPERVISION perihygiene in sitting. MOD A don/doff underwear. CGA tooth brushing standing sink side  ? ? ? ? ?Pertinent Vitals/Pain Pain Assessment ?Pain Assessment: 0-10 ?Pain Score: 5  ?Pain Location: back ?Pain Descriptors / Indicators: Aching, Discomfort, Dull ?Pain Intervention(s): Limited activity within patient's tolerance, Premedicated before session   ? ? ? ?Hand Dominance Right ?  ?Extremity/Trunk Assessment Upper Extremity Assessment ?Upper Extremity Assessment: LUE deficits/detail ?LUE: Unable to fully assess due to immobilization ?  ?Lower Extremity Assessment ?Lower Extremity Assessment: Generalized weakness ?  ?  ?  ?Communication Communication ?Communication: No difficulties ?  ?Cognition Arousal/Alertness: Awake/alert ?Behavior During Therapy: [facility] for tasks assessed/performed ?Overall Cognitive Status: Within Functional Limits for tasks assessed ?  ?  ?  ?  ?  ?  ?  ?  ?  ?  ?  ?  ?  ?  ?  ?  ?  ?  ?  ?   ?   ?   ? ? ?Home Living Family/patient expects to be discharged to:: Private residence ?Living Arrangements: Alone ?Available Help at Discharge: Personal care attendant;Available PRN/intermittently ?Type of Home: Independent living facility ?Home Access: Level entry ?  ?  ?Home Layout: One level ?  ?  ?  ?  ?  ?  ?  ?Home Equipment: None ?  ?Additional Comments: [facility] ?  ? ?  ?Prior Functioning/Environment Prior Level of Function : Independent/Modified Independent;Driving ?  ?  ?  ?  ?  ?  ?  ?  ?  ? ?  ?  ?OT Problem List: Decreased strength;Decreased activity tolerance;Impaired balance (sitting and/or standing);Decreased safety awareness ?  ?   ?OT Treatment/Interventions: Self-care/ADL training;Therapeutic exercise;Energy conservation;DME and/or AE instruction;Therapeutic activities;Patient/family education;Balance training  ?  ?OT Goals(Current goals can be found in the care plan section) Acute Rehab OT Goals ?Patient Stated Goal: to go home after rehab ?OT Goal Formulation: With patient/family ?Time For Goal Achievement: 01/04/22 ?Potential to Achieve Goals: Good ?ADL Goals ?Pt Will Perform Grooming: with set-up;with supervision;standing ?Pt Will Perform Upper Body Dressing: with min assist;with caregiver independent in assisting;sitting ?Pt Will Perform Lower Body Dressing: with min assist;with caregiver independent in assisting;sit  to/from stand ?Pt Will Transfer to Toilet: with modified independence;ambulating;regular height toilet  ?OT Frequency: Min 2X/week ?  ? ?Co-evaluation   ?  ?  ?  ?  ? ?  ?AM-PAC OT "6 Clicks" Daily Activity     ?Outcome Measure Help from another person eating meals?: A Little ?Help from another person taking care of personal grooming?: A Little ?Help from another person toileting, which includes using toliet, bedpan, or urinal?: A Little ?Help from another person bathing (including washing, rinsing, drying)?: A Lot ?Help from another person to put on and taking off regular upper body clothing?: A Lot ?Help from another person to put on and taking off regular lower body clothing?: A Lot ?6 Click Score: 15 ?  ?End of Session   ? ?Activity Tolerance: Patient tolerated treatment well ?Patient left: in chair;with call bell/phone within reach;with chair alarm set;with family/visitor present ? ?OT Visit Diagnosis: Muscle weakness (generalized) (M62.81);Other abnormalities of gait and mobility (R26.89)  ?              ?Time: [id number] ?OT Time Calculation (min): 71 min ?Charges:  OT General Charges ?$OT Visit: 1 Visit ?OT Evaluation ?$OT Eval Moderate Complexity: 1 Mod ?OT Treatments ?$Self Care/Home Management : 53-67 mins ? ?Dessie Coma, M.S. OTR/L  ?12/21/21, 1:13  PM  ?ascom [phone] ? ?

## 2021-12-22 MED ORDER — BISACODYL 5 MG PO TBEC
5.0000 mg | DELAYED_RELEASE_TABLET | Freq: Every day | ORAL | Status: DC | PRN
  Administered 2021-12-22 – 2021-12-23 (×2): 5 mg via ORAL
  Filled 2021-12-22 (×2): qty 1

## 2021-12-22 NOTE — TOC Progression Note (Signed)
Transition of Care (TOC) - Progression Note  ? ? ?Patient Details  ?Name: Natasha Sawyer ?MRN: [id number] ?Date of Birth: 17-Mar-1941 ? ?Transition of Care (TOC) CM/SW Contact  ?Izola Price, RN ?Phone Number: ?12/22/2021, 12:15 PM ? ?Clinical Narrative:  nH ins. Auth. [id number] submitted for discharge to [facility] skilled. Patient was in independent living part of [place] prior to fall and admission to repair shoulder injury. Contacted facility via [employer]. FL2 and CSW referral sent by CSW.  ?Simmie Davies RN CM [phone] [place]) ? ? ? ?  ?  ? ?Expected Discharge Plan and Services ?  ?  ?  ?  ?  ?                ?  ?  ?  ?  ?  ?  ?  ?  ?  ?  ? ? ?Social Determinants of Health (SDOH) Interventions ?  ? ?Readmission Risk Interventions ?   ? View : No data to display.  ?  ?  ?  ? ? ?

## 2021-12-22 NOTE — TOC Progression Note (Signed)
Transition of Care (TOC) - Progression Note  ? ? ?Patient Details  ?Name: Natasha Sawyer ?MRN: [id number] ?Date of Birth: 1941/06/12 ? ?Transition of Care (TOC) CM/SW Contact  ?Demiana Crumbley A Suleman Gunning, LCSW ?Phone Number: ?12/22/2021, 9:08 AM ? ?Clinical Narrative:   Pt is from [facility] and will dc to the SNF at d/c. ? ? ? ?  ?  ? ?Expected Discharge Plan and Services ?  ?  ?  ?  ?  ?                ?  ?  ?  ?  ?  ?  ?  ?  ?  ?  ? ? ?Social Determinants of Health (SDOH) Interventions ?  ? ?Readmission Risk Interventions ?   ? View : No data to display.  ?  ?  ?  ? ? ?

## 2021-12-22 NOTE — NC FL2 (Signed)
?[place] MEDICAID FL2 LEVEL OF CARE SCREENING TOOL  ?  ? ?IDENTIFICATION  ?Patient Name: ?Natasha Sawyer Birthdate: Jul 05, 1941 Sex: female Admission Date (Current Location): ?12/20/2021  ?[place] and [place] Number: ? [place] ?  Facility and Address:  ?[facility], [address], [place], [place] [address] ?     Provider Number: ?[id number]  ?Attending Physician Name and Address:  ?Leim Fabry, MD ? Relative Name and Phone Number:  ?  ?   ?Current Level of Care: ?Hospital Recommended Level of Care: ?[place] Prior Approval Number: ?  ? ?Date Approved/Denied: ?  PASRR Number: ?[id number] ? ?Discharge Plan: ?SNF ?  ? ?Current Diagnoses: ?Patient Active Problem List  ? Diagnosis Date Noted  ? Closed 4-part fracture of proximal humerus with malunion 12/20/2021  ? Secondary hyperparathyroidism of renal origin ([place]) 11/14/2020  ? PSVT (paroxysmal supraventricular tachycardia) ([place]) 11/14/2020  ? Vitamin B 12 deficiency 02/15/2020  ? Fatigue 07/02/2019  ? Osteopenia 02/18/2017  ? Bradycardia 02/14/2012  ? CKD (chronic kidney disease) stage 3, GFR 30-59 ml/min (HCC) 05/10/2009  ? Hyperlipidemia 05/11/2007  ? Essential hypertension, benign 05/11/2007  ? DISPLACEMENT, LUMBAR DISC W/O MYELOPATHY 05/11/2007  ? ? ?Orientation RESPIRATION BLADDER Height & Weight   ?  ?Self, Time, Situation, Place ? Normal Continent Weight: 154 lb 5.2 oz (70 kg) ?Height:  4\' 11"  (149.9 cm)  ?BEHAVIORAL SYMPTOMS/MOOD NEUROLOGICAL BOWEL NUTRITION STATUS  ?    Continent Diet (regular diet thin liquids)  ?AMBULATORY STATUS COMMUNICATION OF NEEDS Skin   ?Limited Assist Verbally Surgical wounds (closed incision left shoulder) ?  ?  ?  ?    ?     ?     ? ? ?Personal Care Assistance Level of Assistance  ?Bathing, Feeding, Dressing Bathing Assistance: Limited assistance ?Feeding assistance: Independent ?Dressing Assistance: Limited assistance ?   ? ?Functional Limitations Info  ?Sight, Speech, Hearing Sight Info:  Adequate ?Hearing Info: Adequate ?Speech Info: Adequate  ? ? ?SPECIAL CARE FACTORS FREQUENCY  ?PT (By licensed PT), OT (By licensed OT)   ?  ?PT Frequency: 5x ?OT Frequency: 5x ?  ?  ?  ?   ? ? ?Contractures Contractures Info: Not present  ? ? ?Additional Factors Info  ?Code Status, Allergies Code Status Info: full code ?Allergies Info: no known allergies ?  ?  ?  ?   ? ?Current Medications (12/22/2021):  This is the current hospital active medication list ?Current Facility-Administered Medications  ?Medication Dose Route Frequency Provider Last Rate Last Admin  ? 0.9 %  sodium chloride infusion   Intravenous Continuous Leim Fabry, MD 75 mL/hr at 12/20/21 1853 New Bag at 12/20/21 1853  ? alum & mag hydroxide-simeth (MAALOX/MYLANTA) 200-200-20 MG/5ML suspension 30 mL  30 mL Oral Q4H PRN Leim Fabry, MD      ? amLODipine (NORVASC) tablet 10 mg  10 mg Oral q AM Leim Fabry, MD   10 mg at 12/22/21 [id number]  ? aspirin EC tablet 325 mg  325 mg Oral Daily Leim Fabry, MD   325 mg at 12/21/21 1044  ? bisacodyl (DULCOLAX) suppository 10 mg  10 mg Rectal Daily PRN Leim Fabry, MD      ? cholecalciferol (VITAMIN D) tablet 2,000 Units  2,000 Units Oral q AM Leim Fabry, MD   2,000 Units at 12/22/21 [phone]  ? docusate sodium (COLACE) capsule 100 mg  100 mg Oral BID Leim Fabry, MD   100 mg at 12/21/21 2311  ? enalapril (VASOTEC)  tablet 20 mg  20 mg Oral q AM Leim Fabry, MD   20 mg at 12/22/21 0511  ? ezetimibe (ZETIA) tablet 10 mg  10 mg Oral Daily Dallie Piles, RPH   10 mg at 12/21/21 1044  ? HYDROmorphone (DILAUDID) injection 0.2-0.4 mg  0.2-0.4 mg Intravenous Q4H PRN Leim Fabry, MD      ? menthol-cetylpyridinium (CEPACOL) lozenge 3 mg  1 lozenge Oral PRN Leim Fabry, MD      ? Or  ? phenol (CHLORASEPTIC) mouth spray 1 spray  1 spray Mouth/Throat PRN Leim Fabry, MD      ? metoCLOPramide (REGLAN) tablet 5-10 mg  5-10 mg Oral Q8H PRN Leim Fabry, MD      ? Or  ? metoCLOPramide (REGLAN) injection 5-10 mg  5-10 mg  Intravenous Q8H PRN Leim Fabry, MD      ? ondansetron [facility]) tablet 4 mg  4 mg Oral Q6H PRN Leim Fabry, MD      ? Or  ? ondansetron [facility]) injection 4 mg  4 mg Intravenous Q6H PRN Leim Fabry, MD      ? oxyCODONE (Oxy IR/ROXICODONE) immediate release tablet 2.5-5 mg  2.5-5 mg Oral Q4H PRN Leim Fabry, MD   5 mg at 12/21/21 1305  ? oxyCODONE (Oxy IR/ROXICODONE) immediate release tablet 5-10 mg  5-10 mg Oral Q4H PRN Leim Fabry, MD   10 mg at 12/22/21 [id number]  ? propranolol (INDERAL) tablet 10 mg  10 mg Oral Daily PRN Leim Fabry, MD      ? senna-docusate (Senokot-S) tablet 1 tablet  1 tablet Oral QHS PRN Leim Fabry, MD   1 tablet at 12/21/21 2311  ? simvastatin (ZOCOR) tablet 20 mg  20 mg Oral Daily Dallie Piles, RPH   20 mg at 12/21/21 1044  ? vitamin B-12 (CYANOCOBALAMIN) tablet 1,000 mcg  1,000 mcg Oral q AM Leim Fabry, MD   1,000 mcg at 12/22/21 [id number]  ? zolpidem (AMBIEN) tablet 2.5 mg  2.5 mg Oral QHS PRN Leim Fabry, MD      ? ? ? ?Discharge Medications: ?Please see discharge summary for a list of discharge medications. ? ?Relevant Imaging Results: ? ?Relevant Lab Results: ? ? ?Additional Information ?SSN: [id number] ? ?Amery Vandenbos A Tylon Kemmerling, LCSW ? ? ? ? ?

## 2021-12-22 NOTE — Progress Notes (Signed)
Physical Therapy Treatment ?Patient Details ?Name: Natasha Sawyer ?MRN: [id number] ?DOB: 11-27-1940 ?Today's Date: 12/22/2021 ? ? ?History of Present Illness Natasha Sawyer is a 81 y.o. female with past medical history of HTN, HDL, PVCs and CKD who presents accompanied by family for evaluation of left shoulder pain after a fall. Found to have a significantly displaced 4-part proximal humerus fracture. S/p Left reverse total shoulder arthroplasty on 12/20/21. ? ?  ?PT Comments  ? ? Pt was long sitting in bed finishing breakfast upon arriving. She is A and O x 4 and agreeable to session. Pt lives alone and does not have assistance available at DC. She is planning to return to [facility] at DC however going to skilled side prior to returning to her apartment. Pt required a little assistance to exit bed, stand, and ambulate. HHA +1 throughout gait training however does have some unsteadiness during. Once returned to room, Pt required assistance with properly positioning and applying polar care. She was sitting in recliner post session with all needs in reach and RN staff aware of her abilities. STR at DC is most appropriate due to lack of assistance available at home.  ?   ?Recommendations for follow up therapy are one component of a multi-disciplinary discharge planning process, led by the attending physician.  Recommendations may be updated based on patient status, additional functional criteria and insurance authorization. ? ?Follow Up Recommendations ? Skilled nursing-short term rehab (<3 hours/day) ?  ?  ?Assistance Recommended at Discharge Frequent or constant Supervision/Assistance  ?Patient can return home with the following A little help with walking and/or transfers;A little help with bathing/dressing/bathroom ?  ?Equipment Recommendations ? None recommended by PT  ?  ?   ?Precautions / Restrictions Precautions ?Precautions: Fall;Shoulder ?Shoulder Interventions: Shoulder sling/immobilizer;Shoulder abduction  pillow;Off for dressing/bathing/exercises ?Restrictions ?Weight Bearing Restrictions: Yes ?LUE Weight Bearing: Non weight bearing  ?  ? ?Mobility ? Bed Mobility ?Overal bed mobility: Needs Assistance ?Bed Mobility: Supine to Sit ?  ?Supine to sit: Min assist ?  ?Transfers ?Overall transfer level: Needs assistance ?Equipment used: 1 person hand held assist ?Transfers: Sit to/from Stand ?Sit to Stand: Min guard ?  ?Ambulation/Gait ?Ambulation/Gait assistance: Min guard ?Gait Distance (Feet): 450 Feet ?Assistive device: 1 person hand held assist ?Gait Pattern/deviations: Step-through pattern, Staggering left, Staggering right ?Gait velocity: decreased ?  ?  ?General Gait Details: pt was able to ambulate ~ 450 ft with +1 HHA. she has several occasions of unsteadiness however no intervention required to correct. Overall pt tolerated gait training well. She has a SPC at home for use at DC. ? ?  ?Balance Overall balance assessment: Needs assistance ?Sitting-balance support: No upper extremity supported, Feet supported ?Sitting balance-Leahy Scale: Good ?  ?  ?Standing balance support: Single extremity supported, During functional activity ?Standing balance-Leahy Scale: Fair ?Standing balance comment: did have a few occasions of unsteadiness during dynamic standing activity ?   ?Cognition Arousal/Alertness: Awake/alert ?Behavior During Therapy: [facility] for tasks assessed/performed ?Overall Cognitive Status: Within Functional Limits for tasks assessed ?  ?   ?General Comments: Pt is A and O x 4. pleasant and cooperative. lives at [facility] requesting skilled side at DC due to lack of assistance available ?  ?  ? ?  ?   ?General Comments General comments (skin integrity, edema, etc.): lengthy education on expectations going forward and management of polar care. Author repositioned polar care properly and reapplied ice. ?  ?  ? ?Pertinent Vitals/Pain Pain Assessment ?  Pain Assessment: 0-10 ?Pain Score: 4  ?Pain Location: L  shoulder ?Pain Descriptors / Indicators: Aching, Discomfort, Dull, Sore ?Pain Intervention(s): Limited activity within patient's tolerance, Monitored during session, Premedicated before session, Repositioned, Ice applied  ? ? ? ?PT Goals (current goals can now be found in the care plan section) Acute Rehab PT Goals ?Patient Stated Goal: to go to rehab to get stronger. ?Progress towards PT goals: Progressing toward goals ? ?  ?Frequency ? ? ? BID ? ? ? ?  ?PT Plan Current plan remains appropriate  ? ? ?   ?AM-PAC PT "6 Clicks" Mobility   ?Outcome Measure ? Help needed turning from your back to your side while in a flat bed without using bedrails?: A Little ?Help needed moving from lying on your back to sitting on the side of a flat bed without using bedrails?: A Little ?Help needed moving to and from a bed to a chair (including a wheelchair)?: A Little ?Help needed standing up from a chair using your arms (e.g., wheelchair or bedside chair)?: A Little ?Help needed to walk in hospital room?: A Little ?Help needed climbing 3-5 steps with a railing? : A Little ?6 Click Score: 18 ? ?  ?End of Session   ?Activity Tolerance: Patient tolerated treatment well ?Patient left: in chair;with call bell/phone within reach;with chair alarm set;with family/visitor present ?Nurse Communication: Mobility status ?PT Visit Diagnosis: Unsteadiness on feet (R26.81);Muscle weakness (generalized) (M62.81) ?  ? ? ?Time: [phone] ?PT Time Calculation (min) (ACUTE ONLY): 24 min ? ?Charges:  $Gait Training: 8-22 mins ?$Therapeutic Activity: 8-22 mins          ?          ? ?Jetta Lout PTA ?12/22/21, 10:21 AM  ? ?

## 2021-12-22 NOTE — Progress Notes (Signed)
?  Subjective: ?2 Days Post-Op Procedure(s) (LRB): ?Left reverse shoulder arthroplasty for proximal humerus fracture, biceps tenodesis (Left) ?Left reverse shoulder arthroplasty for proximal humerus fracture, biceps tenodesis (Left) ?Patient reports pain as well-controlled.   ?Patient is well, and has had no acute complaints or problems ?Plan is to go Skilled nursing facility at [facility] after hospital stay. ?Negative for chest pain and shortness of breath ?Fever: no ?Gastrointestinal: negative for nausea and vomiting.  Patient has not had a bowel movement. ? ?Objective: ?Vital signs in last 24 hours: ?Temp:  [97.8 ?F (36.6 ?C)-98.3 ?F (36.8 ?C)] 97.8 ?F (36.6 ?C) (04/08 4656) ?Pulse Rate:  [59-75] 75 (04/08 0752) ?Resp:  [16-18] 16 (04/08 0752) ?BP: (99-121)/(53-76) 121/76 (04/08 0752) ?SpO2:  [91 %-95 %] 94 % (04/08 0752) ? ?Intake/Output from previous day: ? ?Intake/Output Summary (Last 24 hours) at 12/22/2021 1051 ?Last data filed at 12/22/2021 0400 ?Gross per 24 hour  ?Intake 3203 ml  ?Output --  ?Net 3203 ml  ?  ?Intake/Output this shift: ?No intake/output data recorded. ? ?Labs: ?Recent Labs  ?  12/19/21 ?1500 12/21/21 ?0506  ?HGB 9.7* 9.4*  ? ?Recent Labs  ?  12/19/21 ?1500 12/21/21 ?0506  ?WBC 9.0 13.4*  ?RBC 3.29* 3.14*  ?HCT 30.6* 28.9*  ?PLT 406* 393  ? ?Recent Labs  ?  12/19/21 ?1434 12/21/21 ?0506  ?NA 136 139  ?K 4.3 4.6  ?CL 104 105  ?CO2 21* 18*  ?BUN 21 28*  ?CREATININE 1.14* 1.16*  ?GLUCOSE 95 138*  ?CALCIUM 9.2 9.1  ? ?No results for input(s): LABPT, INR in the last 72 hours. ? ? ?EXAM ?General - Patient is Alert, Appropriate, and Oriented ?Extremity - Neurovascular intact ?Compartment soft, able to move hand and wrist well ?Dressing/Incision -clean, dry, no drainage ?Gastrointestinal- soft, nontender, and active bowel sounds ? ? ?Assessment/Plan: ?2 Days Post-Op Procedure(s) (LRB): ?Left reverse shoulder arthroplasty for proximal humerus fracture, biceps tenodesis (Left) ?Left reverse shoulder  arthroplasty for proximal humerus fracture, biceps tenodesis (Left) ?Principal Problem: ?  Closed 4-part fracture of proximal humerus with malunion ? ?Estimated body mass index is 31.17 kg/m? as calculated from the following: ?  Height as of this encounter: 4\' 11"  (1.499 m). ?  Weight as of this encounter: 70 kg. ?Advance diet ?Up with therapy ? ?Possible d/c to [facility] today pending insurance approval.  ? ?Spoke with patient about need for BM. Patient would like to try Dulcolax tablet  before considering suppository. Order placed for dulcolax tablet,  Senna discontinued.  ? ? ? ?DVT Prophylaxis - ASA 325 qd ?Patient to stay in shoulder immobilizer at all times except when in therapy.  ? ?[facility], PA-C ?[facility] ?12/22/2021, 10:51 AM ? ?

## 2021-12-22 NOTE — Progress Notes (Signed)
Physical Therapy Treatment ?Patient Details ?Name: Natasha Sawyer ?MRN: [id number] ?DOB: 1941/07/09 ?Today's Date: 12/22/2021 ? ? ?History of Present Illness Natasha Sawyer is a 81 y.o. female with past medical history of HTN, HDL, PVCs and CKD who presents accompanied by family for evaluation of left shoulder pain after a fall. Found to have a significantly displaced 4-part proximal humerus fracture. S/p Left reverse total shoulder arthroplasty on 12/20/21. ? ?  ?PT Comments  ? ? Pt was sitting in recliner. Author returned for BID session and pt is agreeable. She is expecting company shortly but agreeable to session. This session, pt used SPC instead of HHA +1. She was able to ambulate ~ 250 ft and perform stairs with CGA only. She is progressing extremely well overall but does still require some assistance with ADLs and  self care task. Discussed at length need to perform ROM on hand, wrist, or elbow to prevent contractures. Due to lack of assistance available at DC and need for assistance, recommend STR to progress to full independence prior to returning home with [facility] therapy.  ?  ?Recommendations for follow up therapy are one component of a multi-disciplinary discharge planning process, led by the attending physician.  Recommendations may be updated based on patient status, additional functional criteria and insurance authorization. ? ?Follow Up Recommendations ? Skilled nursing-short term rehab (<3 hours/day) ?  ?  ?Assistance Recommended at Discharge Frequent or constant Supervision/Assistance  ?Patient can return home with the following A little help with walking and/or transfers;A little help with bathing/dressing/bathroom ?  ?Equipment Recommendations ? None recommended by PT  ?  ?   ?Precautions / Restrictions Precautions ?Precautions: Fall;Shoulder ?Shoulder Interventions: Shoulder sling/immobilizer;Shoulder abduction pillow;Off for dressing/bathing/exercises ?Precaution Booklet Issued: Yes  (comment) ?Restrictions ?Weight Bearing Restrictions: Yes ?LUE Weight Bearing: Non weight bearing  ?  ? ?Mobility ? Bed Mobility ?Overal bed mobility: Needs Assistance ?Bed Mobility: Supine to Sit ? Supine to sit: Min assist ? General bed mobility comments: in recliner pre/post session ?  ? ?Transfers ?Overall transfer level: Needs assistance ?Equipment used: Straight cane ?Transfers: Sit to/from Stand ?Sit to Stand: Min guard ?  ?  ?Ambulation/Gait ?Ambulation/Gait assistance: Min guard ?Gait Distance (Feet): 250 Feet ?Assistive device: Straight cane ?Gait Pattern/deviations: Step-through pattern, Staggering left, Staggering right ?Gait velocity: decreased ?  ?  ?General Gait Details: pt was able to ambulate ~ 250 ft with SPC ? ? ?Stairs ?Stairs: Yes ?Stairs assistance: Min guard ?Stair Management: One rail Right, Step to pattern ?Number of Stairs: 4 ?General stair comments: pt was able to ascend/descend 4 stair with RUE support on rail only. ?  ?Balance Overall balance assessment: Needs assistance ?Sitting-balance support: No upper extremity supported, Feet supported ?Sitting balance-Leahy Scale: Good ?  ?  ?Standing balance support: Single extremity supported, During functional activity ?Standing balance-Leahy Scale: Fair ?Standing balance comment: did have a few occasions of unsteadiness during dynamic standing activity ?  ?  ?  ?Cognition Arousal/Alertness: Awake/alert ?Behavior During Therapy: [employer] for tasks assessed/performed ?Overall Cognitive Status: Within Functional Limits for tasks assessed ?  ? General Comments: Pt is A and O x 4. pleasant and cooperative. lives at [facility] requesting skilled side at DC due to lack of assistance available ?  ?  ? ?  ?   ?General Comments General comments (skin integrity, edema, etc.): lengthy education on expectations going forward and management of polar care. Author repositioned polar care properly and reapplied ice. ?  ?  ? ?Pertinent Vitals/Pain Pain  Assessment ?Pain Assessment: 0-10 ?Pain Score: 2  ?Pain Location: L shoulder ?Pain Descriptors / Indicators: Aching, Discomfort, Dull, Sore ?Pain Intervention(s): Limited activity within patient's tolerance, Monitored during session, Premedicated before session, Repositioned, Ice applied  ? ? ? ?PT Goals (current goals can now be found in the care plan section) Acute Rehab PT Goals ?Patient Stated Goal: to go to rehab to get stronger. ?Progress towards PT goals: Progressing toward goals ? ?  ?Frequency ? ? ? BID ? ? ? ?  ?PT Plan Current plan remains appropriate  ? ? ?   ?AM-PAC PT "6 Clicks" Mobility   ?Outcome Measure ? Help needed turning from your back to your side while in a flat bed without using bedrails?: A Little ?Help needed moving from lying on your back to sitting on the side of a flat bed without using bedrails?: A Little ?Help needed moving to and from a bed to a chair (including a wheelchair)?: A Little ?Help needed standing up from a chair using your arms (e.g., wheelchair or bedside chair)?: A Little ?Help needed to walk in hospital room?: A Little ?Help needed climbing 3-5 steps with a railing? : A Little ?6 Click Score: 18 ? ?  ?End of Session   ?Activity Tolerance: Patient tolerated treatment well ?Patient left: in chair;with call bell/phone within reach;with chair alarm set;with family/visitor present ?Nurse Communication: Mobility status ?PT Visit Diagnosis: Unsteadiness on feet (R26.81);Muscle weakness (generalized) (M62.81) ?  ? ? ?Time: 4431-5400 ?PT Time Calculation (min) (ACUTE ONLY): 19 min ? ?Charges:  $Gait Training: 8-22 mins ?$Therapeutic Activity: 8-22 mins          ?          ? ?Jetta Lout PTA ?12/22/21, 12:04 PM  ? ?

## 2021-12-23 ENCOUNTER — Encounter: Payer: Self-pay | Admitting: Orthopedic Surgery

## 2021-12-23 MED ORDER — BISACODYL 5 MG PO TBEC
10.0000 mg | DELAYED_RELEASE_TABLET | Freq: Every day | ORAL | Status: DC | PRN
  Administered 2021-12-23: 10 mg via ORAL
  Filled 2021-12-23: qty 2

## 2021-12-23 NOTE — Plan of Care (Signed)

## 2021-12-23 NOTE — Progress Notes (Signed)
Physical Therapy Treatment ?Patient Details ?Name: Natasha Sawyer ?MRN: [id number] ?DOB: 16-Jan-1941 ?Today's Date: 12/23/2021 ? ? ?History of Present Illness Natasha Sawyer is a 81 y.o. female with past medical history of HTN, HDL, PVCs and CKD who presents accompanied by family for evaluation of left shoulder pain after a fall. Found to have a significantly displaced 4-part proximal humerus fracture. S/p Left reverse total shoulder arthroplasty on 12/20/21. ? ?  ?PT Comments  ? ? Pt in bathroom with tech.  She is able to do her own self care then continues to walk x 1 lap around unit with no AD and slow cautions gait.  Fatigued with activity today but remains in recliner after session with needs met. ?  ?Recommendations for follow up therapy are one component of a multi-disciplinary discharge planning process, led by the attending physician.  Recommendations may be updated based on patient status, additional functional criteria and insurance authorization. ? ?Follow Up Recommendations ? Skilled nursing-short term rehab (<3 hours/day) ?  ?  ?Assistance Recommended at Discharge Frequent or constant Supervision/Assistance  ?Patient can return home with the following A little help with walking and/or transfers;A little help with bathing/dressing/bathroom ?  ?Equipment Recommendations ? None recommended by PT  ?  ?Recommendations for Other Services   ? ? ?  ?Precautions / Restrictions Precautions ?Precautions: Fall;Shoulder ?Shoulder Interventions: Shoulder sling/immobilizer;Shoulder abduction pillow;Off for dressing/bathing/exercises ?Precaution Booklet Issued: Yes (comment) ?Restrictions ?Weight Bearing Restrictions: Yes ?LUE Weight Bearing: Non weight bearing  ?  ? ?Mobility ? Bed Mobility ?  ?  ?  ?  ?  ?  ?  ?General bed mobility comments: in bathroom with tech upon arrival ?  ? ?Transfers ?Overall transfer level: Needs assistance ?Equipment used: None ?Transfers: Sit to/from Stand ?Sit to Stand: Min guard ?  ?  ?  ?  ?  ?   ?  ? ?Ambulation/Gait ?Ambulation/Gait assistance: Min guard ?Gait Distance (Feet): 160 Feet ?Assistive device: None ?  ?Gait velocity: decreased ?  ?  ?  ? ? ?Stairs ?  ?  ?  ?  ?  ? ? ?Wheelchair Mobility ?  ? ?Modified Rankin (Stroke Patients Only) ?  ? ? ?  ?Balance Overall balance assessment: Needs assistance ?Sitting-balance support: No upper extremity supported, Feet supported ?Sitting balance-Leahy Scale: Good ?  ?  ?Standing balance support: Single extremity supported, During functional activity ?Standing balance-Leahy Scale: Fair ?  ?  ?  ?  ?  ?  ?  ?  ?  ?  ?  ?  ?  ? ?  ?Cognition Arousal/Alertness: Awake/alert ?Behavior During Therapy: [facility] for tasks assessed/performed ?Overall Cognitive Status: Within Functional Limits for tasks assessed ?  ?  ?  ?  ?  ?  ?  ?  ?  ?  ?  ?  ?  ?  ?  ?  ?General Comments: Pt is A and O x 4. pleasant and cooperative. lives at [facility] requesting skilled side at DC due to lack of assistance available ?  ?  ? ?  ?Exercises   ? ?  ?General Comments   ?  ?  ? ?Pertinent Vitals/Pain Pain Assessment ?Pain Assessment: Faces ?Faces Pain Scale: Hurts little more ?Pain Location: L shoulder ?Pain Descriptors / Indicators: Aching, Discomfort, Dull, Sore ?Pain Intervention(s): Limited activity within patient's tolerance, Monitored during session, Repositioned, Ice applied  ? ? ?Home Living   ?  ?  ?  ?  ?  ?  ?  ?  ?  ?   ?  ?  Prior Function    ?  ?  ?   ? ?PT Goals (current goals can now be found in the care plan section) Progress towards PT goals: Progressing toward goals ? ?  ?Frequency ? ? ? BID ? ? ? ?  ?PT Plan Current plan remains appropriate  ? ? ?Co-evaluation   ?  ?  ?  ?  ? ?  ?AM-PAC PT "6 Clicks" Mobility   ?Outcome Measure ? Help needed turning from your back to your side while in a flat bed without using bedrails?: A Little ?Help needed moving from lying on your back to sitting on the side of a flat bed without using bedrails?: A Little ?Help needed moving to and from  a bed to a chair (including a wheelchair)?: A Little ?Help needed standing up from a chair using your arms (e.g., wheelchair or bedside chair)?: A Little ?Help needed to walk in hospital room?: A Little ?Help needed climbing 3-5 steps with a railing? : A Little ?6 Click Score: 18 ? ?  ?End of Session   ?Activity Tolerance: Patient tolerated treatment well ?Patient left: in chair;with call bell/phone within reach;with chair alarm set;with family/visitor present ?Nurse Communication: Mobility status ?PT Visit Diagnosis: Unsteadiness on feet (R26.81);Muscle weakness (generalized) (M62.81) ?  ? ? ?Time: 9476-5465 ?PT Time Calculation (min) (ACUTE ONLY): 11 min ? ?Charges:  $Gait Training: 8-22 mins          ?         Chesley Noon, PTA ?12/23/21, 10:01 AM ? ?

## 2021-12-23 NOTE — Progress Notes (Signed)
?  Subjective: ?3 Days Post-Op Procedure(s) (LRB): ?Left reverse shoulder arthroplasty for proximal humerus fracture, biceps tenodesis (Left) ?Left reverse shoulder arthroplasty for proximal humerus fracture, biceps tenodesis (Left) ?Patient reports pain as well-controlled.   ?Patient is well, and has had no acute complaints or problems ?Plan is to go Skilled nursing facility at [facility] after hospital stay. ?Negative for chest pain and shortness of breath ?Fever: no ?Gastrointestinal: negative for nausea and vomiting.  Patient has  had a small bowel movement. She does request taking 2 dulcolax tablets daily as this is what she takes at home.  ? ?Objective: ?Vital signs in last 24 hours: ?Temp:  [97.9 ?F (36.6 ?C)-98.6 ?F (37 ?C)] 98.6 ?F (37 ?C) (04/09 0750) ?Pulse Rate:  [47-64] 49 (04/09 0750) ?Resp:  [16-18] 16 (04/09 0750) ?BP: (104-127)/(55-67) 119/60 (04/09 0750) ?SpO2:  [91 %-95 %] 91 % (04/09 0750) ? ?Intake/Output from previous day: ? ?Intake/Output Summary (Last 24 hours) at 12/23/2021 1054 ?Last data filed at 12/22/2021 1700 ?Gross per 24 hour  ?Intake 300 ml  ?Output --  ?Net 300 ml  ?  ?Intake/Output this shift: ?No intake/output data recorded. ? ?Labs: ?Recent Labs  ?  12/21/21 ?0506  ?HGB 9.4*  ? ?Recent Labs  ?  12/21/21 ?0506  ?WBC 13.4*  ?RBC 3.14*  ?HCT 28.9*  ?PLT 393  ? ?Recent Labs  ?  12/21/21 ?0506  ?NA 139  ?K 4.6  ?CL 105  ?CO2 18*  ?BUN 28*  ?CREATININE 1.16*  ?GLUCOSE 138*  ?CALCIUM 9.1  ? ?No results for input(s): LABPT, INR in the last 72 hours. ? ? ?EXAM ?General - Patient is Alert, Appropriate, and Oriented ?Extremity - Neurovascular intact ?Compartment soft, able to move hand and wrist well ?Dressing/Incision -clean, dry, no drainage ?Gastrointestinal- soft, nontender, and active bowel sounds ?Gastrointestinal- soft, nontender, and active bowel sounds ? ? ?Assessment/Plan: ?3 Days Post-Op Procedure(s) (LRB): ?Left reverse shoulder arthroplasty for proximal humerus fracture, biceps  tenodesis (Left) ?Left reverse shoulder arthroplasty for proximal humerus fracture, biceps tenodesis (Left) ?Principal Problem: ?  Closed 4-part fracture of proximal humerus with malunion ? ?Estimated body mass index is 31.17 kg/m? as calculated from the following: ?  Height as of this encounter: 4\' 11"  (1.499 m). ?  Weight as of this encounter: 70 kg. ?Advance diet ?Up with therapy ? ?Order placed for 2 Dulcolax tablets, once daily, per patient request.  ? ?Possible d/c to [facility] today pending insurance approval. ? ? ?DVT Prophylaxis - ASA 325 qd ?Patient to stay in shoulder immobilizer at all times except when in therapy.  ? ?[facility], PA-C ?[facility] ?12/23/2021, 10:54 AM ? ?

## 2021-12-24 ENCOUNTER — Encounter: Payer: Self-pay | Admitting: Orthopedic Surgery

## 2021-12-24 DIAGNOSIS — Z9181 History of falling: Secondary | ICD-10-CM | POA: Diagnosis not present

## 2021-12-24 DIAGNOSIS — E785 Hyperlipidemia, unspecified: Secondary | ICD-10-CM | POA: Diagnosis not present

## 2021-12-24 DIAGNOSIS — S42293P Other displaced fracture of upper end of unspecified humerus, subsequent encounter for fracture with malunion: Secondary | ICD-10-CM | POA: Diagnosis not present

## 2021-12-24 DIAGNOSIS — M8580 Other specified disorders of bone density and structure, unspecified site: Secondary | ICD-10-CM | POA: Diagnosis not present

## 2021-12-24 DIAGNOSIS — I129 Hypertensive chronic kidney disease with stage 1 through stage 4 chronic kidney disease, or unspecified chronic kidney disease: Secondary | ICD-10-CM | POA: Diagnosis not present

## 2021-12-24 DIAGNOSIS — S42242D 4-part fracture of surgical neck of left humerus, subsequent encounter for fracture with routine healing: Secondary | ICD-10-CM | POA: Diagnosis not present

## 2021-12-24 DIAGNOSIS — R001 Bradycardia, unspecified: Secondary | ICD-10-CM | POA: Diagnosis not present

## 2021-12-24 DIAGNOSIS — N183 Chronic kidney disease, stage 3 unspecified: Secondary | ICD-10-CM | POA: Diagnosis not present

## 2021-12-24 DIAGNOSIS — I471 Supraventricular tachycardia: Secondary | ICD-10-CM | POA: Diagnosis not present

## 2021-12-24 DIAGNOSIS — S4292XA Fracture of left shoulder girdle, part unspecified, initial encounter for closed fracture: Secondary | ICD-10-CM | POA: Diagnosis not present

## 2021-12-24 DIAGNOSIS — Z96612 Presence of left artificial shoulder joint: Secondary | ICD-10-CM | POA: Diagnosis not present

## 2021-12-24 DIAGNOSIS — M6281 Muscle weakness (generalized): Secondary | ICD-10-CM | POA: Diagnosis not present

## 2021-12-24 DIAGNOSIS — R5381 Other malaise: Secondary | ICD-10-CM | POA: Diagnosis not present

## 2021-12-24 DIAGNOSIS — Z743 Need for continuous supervision: Secondary | ICD-10-CM | POA: Diagnosis not present

## 2021-12-24 DIAGNOSIS — M1990 Unspecified osteoarthritis, unspecified site: Secondary | ICD-10-CM | POA: Diagnosis not present

## 2021-12-24 DIAGNOSIS — I10 Essential (primary) hypertension: Secondary | ICD-10-CM | POA: Diagnosis not present

## 2021-12-24 LAB — SURGICAL PATHOLOGY

## 2021-12-24 MED ORDER — BISACODYL 5 MG PO TBEC: 10.0000 mg | DELAYED_RELEASE_TABLET | Freq: Every day | ORAL | 0 refills | Status: DC | PRN

## 2021-12-24 NOTE — Progress Notes (Signed)
Occupational Therapy Treatment ?Patient Details ?Name: Natasha Sawyer ?MRN: [id number] ?DOB: June 21, 1941 ?Today's Date: 12/24/2021 ? ? ?History of present illness Natasha Sawyer is a 81 y.o. female with past medical history of HTN, HDL, PVCs and CKD who presents accompanied by family for evaluation of left shoulder pain after a fall. Found to have a significantly displaced 4-part proximal humerus fracture. S/p Left reverse total shoulder arthroplasty on 12/20/21. ?  ?OT comments ? Natasha Sawyer was seen for OT treatment on this date. Upon arrival to room pt reclined in chair, agreeable to session, requests to don personal clothing. Pt requires MAX A don/doff sling/shirt/polar care sitting and standing at chair. MOD A don pants in sitting. MOD A apply deodorant in standing.  ? ?Reviewed LUE exercises, completed in sitting/standing as described below with cues for technique. Pt reports not performing over weekend, pt will require cues from family/staff to complete. Pt making good progress toward goals. Pt continues to benefit from skilled OT services to maximize return to PLOF and minimize risk of future falls, injury, caregiver burden, and readmission. Will continue to follow POC. Discharge recommendation remains appropriate.  ?  ? ?Recommendations for follow up therapy are one component of a multi-disciplinary discharge planning process, led by the attending physician.  Recommendations may be updated based on patient status, additional functional criteria and insurance authorization. ?   ?Follow Up Recommendations ? Skilled nursing-short term rehab (<3 hours/day)  ?  ?Assistance Recommended at Discharge Intermittent Supervision/Assistance  ?Patient can return home with the following ? A little help with walking and/or transfers;A lot of help with bathing/dressing/bathroom ?  ?Equipment Recommendations ? BSC/3in1  ?  ?Recommendations for Other Services   ? ?  ?Precautions / Restrictions Precautions ?Precautions:  Fall;Shoulder ?Shoulder Interventions: Shoulder sling/immobilizer;Shoulder abduction pillow;Off for dressing/bathing/exercises ?Precaution Booklet Issued: Yes (comment) ?Restrictions ?Weight Bearing Restrictions: Yes ?LUE Weight Bearing: Non weight bearing  ? ? ?  ? ?Mobility Bed Mobility ?  ?  ?  ?  ?  ?  ?  ?General bed mobility comments: received and left in chair ?  ? ?Transfers ?Overall transfer level: Needs assistance ?Equipment used: None ?Transfers: Sit to/from Stand ?Sit to Stand: Supervision ?  ?  ?  ?  ?  ?  ?  ?  ?Balance Overall balance assessment: Needs assistance ?Sitting-balance support: No upper extremity supported, Feet supported ?Sitting balance-Leahy Scale: Good ?  ?  ?Standing balance support: No upper extremity supported, During functional activity ?Standing balance-Leahy Scale: Fair ?  ?  ?  ?  ?  ?  ?  ?  ?  ?  ?  ?  ?   ? ?ADL either performed or assessed with clinical judgement  ? ?ADL Overall ADL's : Needs assistance/impaired ?  ?  ?  ?  ?  ?  ?  ?  ?  ?  ?  ?  ?  ?  ?  ?  ?  ?  ?  ?General ADL Comments: MAX A don/doff sling/shirt/polar care sitting and standing at chair. MOD A don pants in sitting. MOD A apply deodorant in standing ?  ? ? ? ?Cognition Arousal/Alertness: Awake/alert ?Behavior During Therapy: [facility] for tasks assessed/performed ?Overall Cognitive Status: Within Functional Limits for tasks assessed ?  ?  ?  ?  ?  ?  ?  ?  ?  ?  ?  ?  ?  ?  ?  ?  ?  ?  ?  ?   ?  Exercises Exercises: Shoulder ?Shoulder Exercises ?Pendulum Exercise: Left, 5 reps, Standing ?Elbow Flexion: PROM, Left, 10 reps, Seated ?Elbow Extension: PROM, Left, 10 reps, Seated ?Wrist Flexion: Left, 10 reps, Seated, AROM ?Wrist Extension: Left, 10 reps, Seated, AROM ?Digit Composite Flexion: Left, 10 reps, Seated, AROM ?Composite Extension: Left, 10 reps, Seated, AROM ? ?  ?Shoulder Instructions Shoulder Instructions ?Donning/doffing shirt without moving shoulder: Moderate assistance ?Method for sponge bathing under  operated UE: Moderate assistance ?Donning/doffing sling/immobilizer: Maximal assistance ?Correct positioning of sling/immobilizer: Maximal assistance ?Pendulum exercises (written home exercise program): Minimal assistance ?ROM for elbow, wrist and digits of operated UE: Supervision/safety ? ? ?  ?General Comments    ? ? ?Pertinent Vitals/ Pain       Pain Assessment ?Pain Assessment: 0-10 ?Pain Score: 7  ?Pain Location: L shoulder ?Pain Descriptors / Indicators: Aching, Discomfort, Dull, Sore ?Pain Intervention(s): Limited activity within patient's tolerance, Patient requesting pain meds-RN notified, RN gave pain meds during session ? ? ?Frequency ? Min 2X/week  ? ? ? ? ?  ?Progress Toward Goals ? ?OT Goals(current goals can now be found in the care plan section) ? Progress towards OT goals: Progressing toward goals ? ?Acute Rehab OT Goals ?Patient Stated Goal: to get better ?OT Goal Formulation: With patient/family ?Time For Goal Achievement: 01/04/22 ?Potential to Achieve Goals: Good ?ADL Goals ?Pt Will Perform Grooming: with set-up;with supervision;standing ?Pt Will Perform Upper Body Dressing: with min assist;with caregiver independent in assisting;sitting ?Pt Will Perform Lower Body Dressing: with min assist;with caregiver independent in assisting;sit to/from stand ?Pt Will Transfer to Toilet: with modified independence;ambulating;regular height toilet  ?Plan Discharge plan remains appropriate;Frequency remains appropriate   ? ?Co-evaluation ? ? ?   ?  ?  ?  ?  ? ?  ?AM-PAC OT "6 Clicks" Daily Activity     ?Outcome Measure ? ? Help from another person eating meals?: A Little ?Help from another person taking care of personal grooming?: A Little ?Help from another person toileting, which includes using toliet, bedpan, or urinal?: A Little ?Help from another person bathing (including washing, rinsing, drying)?: A Lot ?Help from another person to put on and taking off regular upper body clothing?: A Lot ?Help from  another person to put on and taking off regular lower body clothing?: A Lot ?6 Click Score: 15 ? ?  ?End of Session   ? ?OT Visit Diagnosis: Muscle weakness (generalized) (M62.81);Other abnormalities of gait and mobility (R26.89) ?  ?Activity Tolerance Patient tolerated treatment well ?  ?Patient Left in chair;with call bell/phone within reach;with nursing/sitter in room ?  ?Nurse Communication   ?  ? ?   ? ?Time: 9892-1194 ?OT Time Calculation (min): 24 min ? ?Charges: OT General Charges ?$OT Visit: 1 Visit ?OT Treatments ?$Self Care/Home Management : 8-22 mins ?$Therapeutic Exercise: 8-22 mins ? ?Kathie Dike, M.S. OTR/L  ?12/24/21, 9:50 AM  ?ascom [phone] ? ?

## 2021-12-24 NOTE — Care Management Important Message (Signed)
Important Message ? ?Patient Details  ?Name: Natasha Sawyer ?MRN: [id number] ?Date of Birth: [birth year] ? ? ?Medicare Important Message Given:  Yes ? ? ? ? ?Olegario Messier A Alvenia Treese ?12/24/2021, 10:55 AM ?

## 2021-12-24 NOTE — TOC Transition Note (Signed)
Transition of Care (TOC) - CM/SW Discharge Note ? ? ?Patient Details  ?Name: IONA STAY ?MRN: [id number] ?Date of Birth: 11-Feb-1941 ? ?Transition of Care (TOC) CM/SW Contact:  ?Olayinka Gathers A Jatoria Kneeland, LCSW ?Phone Number: ?12/24/2021, 4:34 PM ? ? ?Clinical Narrative:   Clinical Social Worker facilitated patient discharge including contacting patient family and facility to confirm patient discharge plans.  Clinical information faxed to facility and family agreeable with plan.  CSW arranged ambulance transport via [facility] to [facility] .  RN to call [phone] for report prior to discharge. ? ? ? ? ?Final next level of care: Skilled Nursing Facility ?Barriers to Discharge: No Barriers Identified ? ? ?Patient Goals and CMS Choice ?  ?  ?  ? ?Discharge Placement ?  ?           ?Patient chooses bed at:  ([facility]) ?Patient to be transferred to facility by: [facility] ?  ?Patient and family notified of of transfer: 12/24/21 ? ?Discharge Plan and Services ?  ?  ?           ?  ?  ?  ?  ?  ?  ?  ?  ?  ?  ? ?Social Determinants of Health (SDOH) Interventions ?  ? ? ?Readmission Risk Interventions ?   ? View : No data to display.  ?  ?  ?  ? ? ? ? ? ?

## 2021-12-24 NOTE — Progress Notes (Signed)
Physical Therapy Treatment ?Patient Details ?Name: Brooklee D Curlin ?MRN: [id number] ?DOB: 03/05/1941 ?Today's Date: 12/24/2021 ? ? ?History of Present Illness Cystal D Stabler is a 81 y.o. female with past medical history of HTN, HDL, PVCs and CKD who presents accompanied by family for evaluation of left shoulder pain after a fall. Found to have a significantly displaced 4-part proximal humerus fracture. S/p Left reverse total shoulder arthroplasty on 12/20/21. ? ?  ?PT Comments  ? ? OOB on left side with mod a x 1 due to shoulder brace and  no use LUE.  Does better on R side.  Stands and walks to bathroom to void then 1 1/2 laps on unit.  Trial of SPC but she prefers no AD - has difficulty with sequencing, encouraged practice but she stated she does not feel it helps her at this time.  She is able to increase gait distance today and appropriate increase in fatigue noted.  Remained in chair after session with needs met. ?  ?Recommendations for follow up therapy are one component of a multi-disciplinary discharge planning process, led by the attending physician.  Recommendations may be updated based on patient status, additional functional criteria and insurance authorization. ? ?Follow Up Recommendations ? Skilled nursing-short term rehab (<3 hours/day) ?  ?  ?Assistance Recommended at Discharge Frequent or constant Supervision/Assistance  ?Patient can return home with the following A little help with walking and/or transfers;A little help with bathing/dressing/bathroom;Help with stairs or ramp for entrance;Assist for transportation ?  ?Equipment Recommendations ? None recommended by PT  ?  ?Recommendations for Other Services   ? ? ?  ?Precautions / Restrictions Precautions ?Precautions: Fall;Shoulder ?Shoulder Interventions: Shoulder sling/immobilizer;Shoulder abduction pillow;Off for dressing/bathing/exercises ?Precaution Booklet Issued: Yes (comment) ?Restrictions ?Weight Bearing Restrictions: Yes ?LUE Weight Bearing: Non  weight bearing  ?  ? ?Mobility ? Bed Mobility ?Overal bed mobility: Needs Assistance ?Bed Mobility: Supine to Sit ?  ?  ?Supine to sit: Mod assist ?  ?  ?General bed mobility comments: to left side of bed ?  ? ?Transfers ?Overall transfer level: Needs assistance ?Equipment used: None ?Transfers: Sit to/from Stand ?  ?  ?  ?  ?  ?  ?  ?  ? ?Ambulation/Gait ?Ambulation/Gait assistance: Min guard ?Gait Distance (Feet): 240 Feet ?Assistive device: None, Straight cane ?Gait Pattern/deviations: Step-through pattern, Staggering left, Staggering right ?Gait velocity: decreased ?  ?  ?General Gait Details: prefers gait without SPC ? ? ?Stairs ?  ?  ?  ?  ?  ? ? ?Wheelchair Mobility ?  ? ?Modified Rankin (Stroke Patients Only) ?  ? ? ?  ?Balance Overall balance assessment: Needs assistance ?Sitting-balance support: No upper extremity supported, Feet supported ?Sitting balance-Leahy Scale: Good ?  ?  ?Standing balance support: Single extremity supported, During functional activity ?Standing balance-Leahy Scale: Fair ?  ?  ?  ?  ?  ?  ?  ?  ?  ?  ?  ?  ?  ? ?  ?Cognition Arousal/Alertness: Awake/alert ?Behavior During Therapy: WFL for tasks assessed/performed ?Overall Cognitive Status: Within Functional Limits for tasks assessed ?  ?  ?  ?  ?  ?  ?  ?  ?  ?  ?  ?  ?  ?  ?  ?  ?  ?  ?  ? ?  ?Exercises Other Exercises ?Other Exercises: bathroom to void.  ind self care but light assit for clothing ? ?  ?General Comments   ?  ?  ? ?  Pertinent Vitals/Pain Pain Assessment ?Pain Assessment: Faces ?Faces Pain Scale: Hurts a little bit ?Pain Location: L shoulder ?Pain Descriptors / Indicators: Aching, Discomfort, Dull, Sore ?Pain Intervention(s): Limited activity within patient's tolerance, Monitored during session, Repositioned, Ice applied  ? ? ?Home Living   ?  ?  ?  ?  ?  ?  ?  ?  ?  ?   ?  ?Prior Function    ?  ?  ?   ? ?PT Goals (current goals can now be found in the care plan section) Progress towards PT goals: Progressing toward  goals ? ?  ?Frequency ? ? ? BID ? ? ? ?  ?PT Plan Current plan remains appropriate  ? ? ?Co-evaluation   ?  ?  ?  ?  ? ?  ?AM-PAC PT "6 Clicks" Mobility   ?Outcome Measure ? Help needed turning from your back to your side while in a flat bed without using bedrails?: A Little ?Help needed moving from lying on your back to sitting on the side of a flat bed without using bedrails?: A Little ?Help needed moving to and from a bed to a chair (including a wheelchair)?: A Little ?Help needed standing up from a chair using your arms (e.g., wheelchair or bedside chair)?: A Little ?Help needed to walk in hospital room?: A Little ?Help needed climbing 3-5 steps with a railing? : A Little ?6 Click Score: 18 ? ?  ?End of Session Equipment Utilized During Treatment: Gait belt ?Activity Tolerance: Patient tolerated treatment well ?Patient left: in chair;with call bell/phone within reach;with chair alarm set;with family/visitor present ?Nurse Communication: Mobility status ?PT Visit Diagnosis: Unsteadiness on feet (R26.81);Muscle weakness (generalized) (M62.81) ?  ? ? ?Time: 8127-5170 ?PT Time Calculation (min) (ACUTE ONLY): 15 min ? ?Charges:  $Gait Training: 8-22 mins          ?         Chesley Noon, PTA ?12/24/21, 9:05 AM ? ?

## 2021-12-24 NOTE — Plan of Care (Signed)
Patient discharged per MD orders at this time.All discharge instructions, education and medications reviewed with the patient.Pt expressed understanding and will comply with dc instructions.f/u appointments was also communicated to the patient.no verbal c/o or any ssx of distress.patient was discharged to [facility] for PT/OT services per order.report was called to Ms.Faith Hairston.floor nurse before transport.Pt was transported by 2 [facility] personnel on a .stretcher. ?

## 2021-12-24 NOTE — Progress Notes (Signed)
Physical Therapy Treatment ?Patient Details ?Name: Natasha Sawyer ?MRN: [id number] ?DOB: August 16, 1941 ?Today's Date: 12/24/2021 ? ? ?History of Present Illness Natasha Sawyer is a 80 y.o. female with past medical history of HTN, HDL, PVCs and CKD who presents accompanied by family for evaluation of left shoulder pain after a fall. Found to have a significantly displaced 4-part proximal humerus fracture. S/p Left reverse total shoulder arthroplasty on 12/20/21. ? ?  ?PT Comments  ? ? Pt to bathroom to void then walks x 2 laps on unit before stopping and sink to brush teeth prior to return to bed with min a x 1,  awaiting insurance auth for SNF. ? ?SNF remains appropriate as she is limited in her self care and ADL's due to shoulder sling and NWB,  balance remains impaired and she will need +1 assist for all tasks upon discharge. ?  ?Recommendations for follow up therapy are one component of a multi-disciplinary discharge planning process, led by the attending physician.  Recommendations may be updated based on patient status, additional functional criteria and insurance authorization. ? ?Follow Up Recommendations ? Skilled nursing-short term rehab (<3 hours/day) ?  ?  ?Assistance Recommended at Discharge Frequent or constant Supervision/Assistance  ?Patient can return home with the following A little help with walking and/or transfers;A little help with bathing/dressing/bathroom;Help with stairs or ramp for entrance;Assist for transportation ?  ?Equipment Recommendations ? None recommended by PT  ?  ?Recommendations for Other Services   ? ? ?  ?Precautions / Restrictions Precautions ?Precautions: Fall;Shoulder ?Shoulder Interventions: Shoulder sling/immobilizer;Shoulder abduction pillow;Off for dressing/bathing/exercises ?Restrictions ?Weight Bearing Restrictions: Yes ?LUE Weight Bearing: Non weight bearing  ?  ? ?Mobility ? Bed Mobility ?Overal bed mobility: Needs Assistance ?Bed Mobility: Supine to Sit ?  ?  ?Supine to sit:  Mod assist ?  ?  ?General bed mobility comments: to left side of bed ?  ? ?Transfers ?Overall transfer level: Needs assistance ?Equipment used: None ?Transfers: Sit to/from Stand ?  ?  ?  ?  ?  ?  ?  ?  ? ?Ambulation/Gait ?Ambulation/Gait assistance: Min guard ?Gait Distance (Feet): 360 Feet ?Assistive device: None ?Gait Pattern/deviations: Step-through pattern, Staggering left, Staggering right ?Gait velocity: decreased ?  ?  ?General Gait Details: prefers gait without SPC ? ? ?Stairs ?  ?  ?  ?  ?  ? ? ?Wheelchair Mobility ?  ? ?Modified Rankin (Stroke Patients Only) ?  ? ? ?  ?Balance Overall balance assessment: Needs assistance ?Sitting-balance support: No upper extremity supported, Feet supported ?Sitting balance-Leahy Scale: Good ?  ?  ?Standing balance support: No upper extremity supported, During functional activity ?Standing balance-Leahy Scale: Fair ?  ?  ?  ?  ?  ?  ?  ?  ?  ?  ?  ?  ?  ? ?  ?Cognition Arousal/Alertness: Awake/alert ?Behavior During Therapy: [facility] for tasks assessed/performed ?Overall Cognitive Status: Within Functional Limits for tasks assessed ?  ?  ?  ?  ?  ?  ?  ?  ?  ?  ?  ?  ?  ?  ?  ?  ?  ?  ?  ? ?  ?Exercises Other Exercises ?Other Exercises: brushes teeth and uses toilet to void ? ?  ?General Comments   ?  ?  ? ?Pertinent Vitals/Pain Pain Assessment ?Pain Assessment: Faces ?Faces Pain Scale: Hurts a little bit ?Pain Location: L shoulder ?Pain Descriptors / Indicators: Aching, Discomfort, Dull, Sore ?Pain Intervention(s):  Limited activity within patient's tolerance, Repositioned, Ice applied  ? ? ?Home Living   ?  ?  ?  ?  ?  ?  ?  ?  ?  ?   ?  ?Prior Function    ?  ?  ?   ? ?PT Goals (current goals can now be found in the care plan section) Progress towards PT goals: Progressing toward goals ? ?  ?Frequency ? ? ? BID ? ? ? ?  ?PT Plan Current plan remains appropriate  ? ? ?Co-evaluation   ?  ?  ?  ?  ? ?  ?AM-PAC PT "6 Clicks" Mobility   ?Outcome Measure ? Help needed turning from  your back to your side while in a flat bed without using bedrails?: A Little ?Help needed moving from lying on your back to sitting on the side of a flat bed without using bedrails?: A Little ?Help needed moving to and from a bed to a chair (including a wheelchair)?: A Little ?Help needed standing up from a chair using your arms (e.g., wheelchair or bedside chair)?: A Little ?Help needed to walk in hospital room?: A Little ?Help needed climbing 3-5 steps with a railing? : A Little ?6 Click Score: 18 ? ?  ?End of Session Equipment Utilized During Treatment: Gait belt ?Activity Tolerance: Patient tolerated treatment well ?Patient left: in bed;with call bell/phone within reach;with bed alarm set ?Nurse Communication: Mobility status ?PT Visit Diagnosis: Unsteadiness on feet (R26.81);Muscle weakness (generalized) (M62.81) ?  ? ? ?Time: 0155-0212 ?PT Time Calculation (min) (ACUTE ONLY): 17 min ? ?Charges:  $Gait Training: 8-22 mins          ?         Danielle Dess, PTA ?12/24/21, 2:31 PM ? ?

## 2021-12-24 NOTE — TOC Progression Note (Signed)
Transition of Care (TOC) - Progression Note  ? ? ?Patient Details  ?Name: ULA CATES ?MRN: [id number] ?Date of Birth: September 01, 1941 ? ?Transition of Care (TOC) CM/SW Contact  ?Lamyia Cdebaca A Miner Koral, LCSW ?Phone Number: ?12/24/2021, 11:47 AM ? ?Clinical Narrative:   Josem Kaufmann still pending in [employer] portal. ? ? ? ?  ?  ? ?Expected Discharge Plan and Services ?  ?  ?  ?  ?  ?Expected Discharge Date: 12/24/21               ?  ?  ?  ?  ?  ?  ?  ?  ?  ?  ? ? ?Social Determinants of Health (SDOH) Interventions ?  ? ?Readmission Risk Interventions ?   ? View : No data to display.  ?  ?  ?  ? ? ?

## 2021-12-24 NOTE — Progress Notes (Signed)
?  Subjective: ?4 Days Post-Op Procedure(s) (LRB): ?Left reverse shoulder arthroplasty for proximal humerus fracture, biceps tenodesis (Left) ?Left reverse shoulder arthroplasty for proximal humerus fracture, biceps tenodesis (Left) ?Patient reports pain as well-controlled.   ?Patient is well, and has had no acute complaints or problems ?Plan is to go Skilled nursing facility at [facility] after hospital stay. ?Negative for chest pain and shortness of breath ?Fever: no ?Gastrointestinal: negative for nausea and vomiting.  Patient has  had a small bowel movement. She does request taking 2 dulcolax tablets daily as this is what she takes at home.  ? ?Objective: ?Vital signs in last 24 hours: ?Temp:  [98.1 ?F (36.7 ?C)-98.6 ?F (37 ?C)] 98.5 ?F (36.9 ?C) (04/10 0407) ?Pulse Rate:  [49-68] 55 (04/10 0407) ?Resp:  [16-18] 18 (04/10 0407) ?BP: (110-125)/(60-69) 125/68 (04/10 0407) ?SpO2:  [91 %-97 %] 94 % (04/10 0407) ? ?Intake/Output from previous day: ?No intake or output data in the 24 hours ending 12/24/21 0700 ?  ?Intake/Output this shift: ?No intake/output data recorded. ? ?Labs: ?No results for input(s): HGB in the last 72 hours. ? ?No results for input(s): WBC, RBC, HCT, PLT in the last 72 hours. ? ?No results for input(s): NA, K, CL, CO2, BUN, CREATININE, GLUCOSE, CALCIUM in the last 72 hours. ? ?No results for input(s): LABPT, INR in the last 72 hours. ? ? ?EXAM ?General - Patient is Alert, Appropriate, and Oriented ?Extremity - Neurovascular intact ?Compartment soft, able to move hand and wrist well ?Dressing/Incision -clean, dry, no drainage ?Gastrointestinal- soft, nontender, and active bowel sounds ?Gastrointestinal- soft, nontender, and active bowel sounds ? ? ?Assessment/Plan: ?4 Days Post-Op Procedure(s) (LRB): ?Left reverse shoulder arthroplasty for proximal humerus fracture, biceps tenodesis (Left) ?Left reverse shoulder arthroplasty for proximal humerus fracture, biceps tenodesis (Left) ?Principal  Problem: ?  Closed 4-part fracture of proximal humerus with malunion ? ?Estimated body mass index is 31.17 kg/m? as calculated from the following: ?  Height as of this encounter: 4\' 11"  (1.499 m). ?  Weight as of this encounter: 70 kg. ?Advance diet ?Up with therapy ? ?Order placed for 2 Dulcolax tablets, once daily, per patient request.  ? ?Possible d/c to [facility] today pending insurance approval. ? ? ?DVT Prophylaxis - ASA 325 qd ?Patient to stay in shoulder immobilizer at all times except when in therapy.  ? ?Reche Dixon PA-C ?[facility] ?12/24/2021, 7:00 AM ? ?

## 2021-12-24 NOTE — Plan of Care (Signed)

## 2021-12-25 DIAGNOSIS — E785 Hyperlipidemia, unspecified: Secondary | ICD-10-CM | POA: Diagnosis not present

## 2021-12-25 DIAGNOSIS — S42293P Other displaced fracture of upper end of unspecified humerus, subsequent encounter for fracture with malunion: Secondary | ICD-10-CM | POA: Diagnosis not present

## 2021-12-25 DIAGNOSIS — I10 Essential (primary) hypertension: Secondary | ICD-10-CM | POA: Diagnosis not present

## 2021-12-25 DIAGNOSIS — I471 Supraventricular tachycardia: Secondary | ICD-10-CM | POA: Diagnosis not present

## 2021-12-25 NOTE — Progress Notes (Signed)
Preop urinalysis with +LE and bacteria ?

## 2022-01-02 DIAGNOSIS — S4292XA Fracture of left shoulder girdle, part unspecified, initial encounter for closed fracture: Secondary | ICD-10-CM | POA: Diagnosis not present

## 2022-01-09 DIAGNOSIS — M6281 Muscle weakness (generalized): Secondary | ICD-10-CM | POA: Diagnosis not present

## 2022-01-09 DIAGNOSIS — S42242D 4-part fracture of surgical neck of left humerus, subsequent encounter for fracture with routine healing: Secondary | ICD-10-CM | POA: Diagnosis not present

## 2022-01-09 DIAGNOSIS — E785 Hyperlipidemia, unspecified: Secondary | ICD-10-CM | POA: Diagnosis not present

## 2022-01-09 DIAGNOSIS — M1990 Unspecified osteoarthritis, unspecified site: Secondary | ICD-10-CM | POA: Diagnosis not present

## 2022-01-09 DIAGNOSIS — R4189 Other symptoms and signs involving cognitive functions and awareness: Secondary | ICD-10-CM | POA: Diagnosis not present

## 2022-01-09 DIAGNOSIS — N183 Chronic kidney disease, stage 3 unspecified: Secondary | ICD-10-CM | POA: Diagnosis not present

## 2022-01-09 DIAGNOSIS — Z741 Need for assistance with personal care: Secondary | ICD-10-CM | POA: Diagnosis not present

## 2022-01-09 DIAGNOSIS — R278 Other lack of coordination: Secondary | ICD-10-CM | POA: Diagnosis not present

## 2022-01-09 DIAGNOSIS — Z96612 Presence of left artificial shoulder joint: Secondary | ICD-10-CM | POA: Diagnosis not present

## 2022-01-11 DIAGNOSIS — N183 Chronic kidney disease, stage 3 unspecified: Secondary | ICD-10-CM | POA: Diagnosis not present

## 2022-01-11 DIAGNOSIS — M1990 Unspecified osteoarthritis, unspecified site: Secondary | ICD-10-CM | POA: Diagnosis not present

## 2022-01-11 DIAGNOSIS — S42242D 4-part fracture of surgical neck of left humerus, subsequent encounter for fracture with routine healing: Secondary | ICD-10-CM | POA: Diagnosis not present

## 2022-01-11 DIAGNOSIS — Z741 Need for assistance with personal care: Secondary | ICD-10-CM | POA: Diagnosis not present

## 2022-01-11 DIAGNOSIS — R278 Other lack of coordination: Secondary | ICD-10-CM | POA: Diagnosis not present

## 2022-01-11 DIAGNOSIS — E785 Hyperlipidemia, unspecified: Secondary | ICD-10-CM | POA: Diagnosis not present

## 2022-01-11 DIAGNOSIS — M6281 Muscle weakness (generalized): Secondary | ICD-10-CM | POA: Diagnosis not present

## 2022-01-11 DIAGNOSIS — Z96612 Presence of left artificial shoulder joint: Secondary | ICD-10-CM | POA: Diagnosis not present

## 2022-01-11 DIAGNOSIS — R4189 Other symptoms and signs involving cognitive functions and awareness: Secondary | ICD-10-CM | POA: Diagnosis not present

## 2022-01-14 DIAGNOSIS — Z96612 Presence of left artificial shoulder joint: Secondary | ICD-10-CM | POA: Diagnosis not present

## 2022-01-14 DIAGNOSIS — N183 Chronic kidney disease, stage 3 unspecified: Secondary | ICD-10-CM | POA: Diagnosis not present

## 2022-01-14 DIAGNOSIS — R4189 Other symptoms and signs involving cognitive functions and awareness: Secondary | ICD-10-CM | POA: Diagnosis not present

## 2022-01-14 DIAGNOSIS — R278 Other lack of coordination: Secondary | ICD-10-CM | POA: Diagnosis not present

## 2022-01-14 DIAGNOSIS — Z741 Need for assistance with personal care: Secondary | ICD-10-CM | POA: Diagnosis not present

## 2022-01-14 DIAGNOSIS — M1990 Unspecified osteoarthritis, unspecified site: Secondary | ICD-10-CM | POA: Diagnosis not present

## 2022-01-14 DIAGNOSIS — M6281 Muscle weakness (generalized): Secondary | ICD-10-CM | POA: Diagnosis not present

## 2022-01-14 DIAGNOSIS — S42242D 4-part fracture of surgical neck of left humerus, subsequent encounter for fracture with routine healing: Secondary | ICD-10-CM | POA: Diagnosis not present

## 2022-01-14 DIAGNOSIS — E785 Hyperlipidemia, unspecified: Secondary | ICD-10-CM | POA: Diagnosis not present

## 2022-01-15 ENCOUNTER — Ambulatory Visit (INDEPENDENT_AMBULATORY_CARE_PROVIDER_SITE_OTHER): Payer: Medicare PPO | Admitting: Family Medicine

## 2022-01-15 ENCOUNTER — Encounter: Payer: Self-pay | Admitting: Family Medicine

## 2022-01-15 VITALS — BP 110/66 | HR 53 | Temp 97.7°F | Ht 59.25 in | Wt 148.5 lb

## 2022-01-15 DIAGNOSIS — R278 Other lack of coordination: Secondary | ICD-10-CM | POA: Diagnosis not present

## 2022-01-15 DIAGNOSIS — N183 Chronic kidney disease, stage 3 unspecified: Secondary | ICD-10-CM | POA: Diagnosis not present

## 2022-01-15 DIAGNOSIS — S42293P Other displaced fracture of upper end of unspecified humerus, subsequent encounter for fracture with malunion: Secondary | ICD-10-CM

## 2022-01-15 DIAGNOSIS — G4700 Insomnia, unspecified: Secondary | ICD-10-CM | POA: Diagnosis not present

## 2022-01-15 DIAGNOSIS — Z96612 Presence of left artificial shoulder joint: Secondary | ICD-10-CM | POA: Diagnosis not present

## 2022-01-15 DIAGNOSIS — W19XXXD Unspecified fall, subsequent encounter: Secondary | ICD-10-CM | POA: Diagnosis not present

## 2022-01-15 DIAGNOSIS — M1990 Unspecified osteoarthritis, unspecified site: Secondary | ICD-10-CM | POA: Diagnosis not present

## 2022-01-15 DIAGNOSIS — S42242D 4-part fracture of surgical neck of left humerus, subsequent encounter for fracture with routine healing: Secondary | ICD-10-CM | POA: Diagnosis not present

## 2022-01-15 DIAGNOSIS — R4189 Other symptoms and signs involving cognitive functions and awareness: Secondary | ICD-10-CM | POA: Diagnosis not present

## 2022-01-15 DIAGNOSIS — M6281 Muscle weakness (generalized): Secondary | ICD-10-CM | POA: Diagnosis not present

## 2022-01-15 DIAGNOSIS — E785 Hyperlipidemia, unspecified: Secondary | ICD-10-CM | POA: Diagnosis not present

## 2022-01-15 DIAGNOSIS — Z741 Need for assistance with personal care: Secondary | ICD-10-CM | POA: Diagnosis not present

## 2022-01-15 NOTE — Patient Instructions (Addendum)
Recommended continued use of ASA 325mg /day x total 6 weeks post-operatively. ?Can take 1 full tablet of Ambien in the short term while trying to  sleep with  a sling. ? Make sure walking daily and not napping. ? Consider change to trazodone if this is not working  for sleep. ?

## 2022-01-15 NOTE — Progress Notes (Signed)
Patient ID: Natasha KennerSusan D Sawyer, female    DOB: 1941-01-19, 81 y.o.   MRN: [id number]  This visit was conducted in person.  BP 110/66   Pulse (!) 53   Temp 97.7 F (36.5 C) (Oral)   Ht 4' 11.25" (1.505 m)   Wt 148 lb 8 oz (67.4 kg)   SpO2 96%   BMI 29.74 kg/m    CC:  Chief Complaint  Patient presents with   Hospitalization Follow-up    Subjective:   HPI: Natasha Sawyer is a 81 y.o. female presenting on 01/15/2022 for Hospitalization Follow-up  She was initially evaluated in the emergency department on 12/10/2021 after she had a fall and hit a metal door.  Had facial abrasion.  CT head unremarkable. Radiographs in the emergency department showed a glenohumeral joint dislocation with proximal humerus fracture. She underwent successful closed reduction in the emergency department. She was placed in a sling and instructed to follow-up with our department. She was then evaluated by Baldwin JamaicaBen Smith, PA on 12/14/2021. A CT scan was obtained. [facility].   S/P left shoulder arthroplasty on 12/20/2021   Has PT [facility]  Using tylenol for pain.  She reports thing are improving.  No headache, no confusion, no new neuro change.   She reports the fall was accidental, no proceeding symptoms.  Was wearing new shoes, thicker sole, tripped.   She is having some issues sleeping at night.. using Ambien 1/2 tablet, but still having  early morning waking.  Relevant past medical, surgical, family and social history reviewed and updated as indicated. Interim medical history since our last visit reviewed. Allergies and medications reviewed and updated. Outpatient Medications Prior to Visit  Medication Sig Dispense Refill   acetaminophen (TYLENOL) 650 MG CR tablet Take 650 mg by mouth every 8 (eight) hours as needed for pain.     amLODipine (NORVASC) 10 MG tablet Take 10 mg by mouth in the morning.     aspirin EC 325 MG EC tablet Take 1 tablet (325 mg total) by mouth daily. 45  tablet 0   Cholecalciferol (VITAMIN D3) 50 MCG (2000 UT) TABS Take 2,000 Units by mouth in the morning.     Cyanocobalamin (B-12 PO) Take 1,000 mcg by mouth in the morning.     enalapril (VASOTEC) 20 MG tablet Take 20 mg by mouth in the morning.     ezetimibe-simvastatin (VYTORIN) 10-20 MG tablet Take 1 tablet by mouth in the morning.     oxyCODONE (OXY IR/ROXICODONE) 5 MG immediate release tablet Take 0.5-1 tablets (2.5-5 mg total) by mouth every 4 (four) hours as needed for moderate pain (pain score 4-6). 30 tablet 0   zolpidem (AMBIEN) 5 MG tablet Take 2.5 mg by mouth at bedtime as needed for sleep.     propranolol (INDERAL) 10 MG tablet Take 10 mg by mouth daily as needed (palpitations.). (Patient not taking: Reported on 01/15/2022)     bisacodyl (DULCOLAX) 5 MG EC tablet Take 2 tablets (10 mg total) by mouth daily as needed for moderate constipation. 30 tablet 0   ezetimibe-simvastatin (VYTORIN) 10-20 MG tablet TAKE 1 TABLET AT BEDTIME (Patient taking differently: Take 1 tablet by mouth in the morning.) 90 tablet 0   ondansetron (ZOFRAN) 4 MG tablet Take 1 tablet (4 mg total) by mouth every 6 (six) hours as needed for nausea. 20 tablet 0   No facility-administered medications prior to visit.  Per HPI unless specifically indicated in ROS section below Review of Systems  Constitutional:  Negative for fatigue and fever.  HENT:  Negative for congestion.   Eyes:  Negative for pain.  Respiratory:  Negative for cough and shortness of breath.   Cardiovascular:  Negative for chest pain, palpitations and leg swelling.  Gastrointestinal:  Negative for abdominal pain.  Genitourinary:  Negative for dysuria and vaginal bleeding.  Musculoskeletal:  Negative for back pain.  Neurological:  Negative for syncope, light-headedness and headaches.  Psychiatric/Behavioral:  Negative for dysphoric mood.    Objective:  BP 110/66   Pulse (!) 53   Temp 97.7 F (36.5 C) (Oral)   Ht 4' 11.25" (1.505 m)    Wt 148 lb 8 oz (67.4 kg)   SpO2 96%   BMI 29.74 kg/m   Wt Readings from Last 3 Encounters:  01/15/22 148 lb 8 oz (67.4 kg)  12/20/21 154 lb 5.2 oz (70 kg)  12/19/21 154 lb 6.4 oz (70 kg)      Physical Exam Constitutional:      General: She is not in acute distress.    Appearance: Normal appearance. She is well-developed. She is not ill-appearing or toxic-appearing.  HENT:     Head: Normocephalic.     Right Ear: Hearing, tympanic membrane, ear canal and external ear normal. Tympanic membrane is not erythematous, retracted or bulging.     Left Ear: Hearing, tympanic membrane, ear canal and external ear normal. Tympanic membrane is not erythematous, retracted or bulging.     Nose: No mucosal edema or rhinorrhea.     Right Sinus: No maxillary sinus tenderness or frontal sinus tenderness.     Left Sinus: No maxillary sinus tenderness or frontal sinus tenderness.     Mouth/Throat:     Pharynx: Uvula midline.  Eyes:     General: Lids are normal. Lids are everted, no foreign bodies appreciated.     Conjunctiva/sclera: Conjunctivae normal.     Pupils: Pupils are equal, round, and reactive to light.  Neck:     Thyroid: No thyroid mass or thyromegaly.     Vascular: No carotid bruit.     Trachea: Trachea normal.  Cardiovascular:     Rate and Rhythm: Normal rate and regular rhythm.     Pulses: Normal pulses.     Heart sounds: Normal heart sounds, S1 normal and S2 normal. No murmur heard.    No friction rub. No gallop.  Pulmonary:     Effort: Pulmonary effort is normal. No tachypnea or respiratory distress.     Breath sounds: Normal breath sounds. No decreased breath sounds, wheezing, rhonchi or rales.  Abdominal:     General: Bowel sounds are normal.     Palpations: Abdomen is soft.     Tenderness: There is no abdominal tenderness.  Musculoskeletal:     Cervical back: Normal range of motion and neck supple.  Skin:    General: Skin is warm and dry.     Findings: No rash.   Neurological:     Mental Status: She is alert.  Psychiatric:        Mood and Affect: Mood is not anxious or depressed.        Speech: Speech normal.        Behavior: Behavior normal. Behavior is cooperative.        Thought Content: Thought content normal.        Judgment: Judgment normal.       Results for orders  placed or performed during the hospital encounter of 12/20/21  CBC  Result Value Ref Range   WBC 13.4 (H) 4.0 - 10.5 K/uL   RBC 3.14 (L) 3.87 - 5.11 MIL/uL   Hemoglobin 9.4 (L) 12.0 - 15.0 g/dL   HCT 78.4 (L) 69.6 - 29.5 %   MCV 92.0 80.0 - 100.0 fL   MCH 29.9 26.0 - 34.0 pg   MCHC 32.5 30.0 - 36.0 g/dL   RDW 28.4 13.2 - 44.0 %   Platelets 393 150 - 400 K/uL   nRBC 0.0 0.0 - 0.2 %  Basic metabolic panel  Result Value Ref Range   Sodium 139 135 - 145 mmol/L   Potassium 4.6 3.5 - 5.1 mmol/L   Chloride 105 98 - 111 mmol/L   CO2 18 (L) 22 - 32 mmol/L   Glucose, Bld 138 (H) 70 - 99 mg/dL   BUN 28 (H) 8 - 23 mg/dL   Creatinine, Ser 1.02 (H) 0.44 - 1.00 mg/dL   Calcium 9.1 8.9 - 72.5 mg/dL   GFR, Estimated 48 (L) >60 mL/min   Anion gap 16 (H) 5 - 15  ABO/Rh  Result Value Ref Range   ABO/RH(D)      A NEG Performed at [facility], [address] [place], [place] [address]   Surgical pathology  Result Value Ref Range   SURGICAL PATHOLOGY      SURGICAL PATHOLOGY CASE: [id number] PATIENT: [facility] Surgical Pathology Report     Specimen Submitted: A. Humeral head, left  Clinical History: Closed fracture dislocation of joint of left shoulder girdle, initial encounter S42.92XA      DIAGNOSIS: A. HUMERAL HEAD, LEFT; ARTHROPLASTY: - BONE AND MARROW STROMA WITH CHANGES CONSISTENT WITH PROVIDED HISTORY OF FRACTURE. - NEGATIVE FOR MALIGNANCY.  GROSS DESCRIPTION: A. Labeled: Left humerus head Received: Fresh Collection time: 4:27 PM on 12/20/2021 Placed into formalin time: 5:31 PM on 12/20/2021 Size of specimen:      Head -4.1 x 4.0 x 1.4  cm      Neck -none present      Additional tissue: None present Articular surface: Tan and glistening Cut surface: Yellow to red and firm Other findings: The bone margin is yellow to brown and jagged  Block summary: 1 -reamings from bone margin 2-representative cross-sections  Tissue decalcification: Cassettes 1 and 2  CM 12/21/2021  Final Diagnosis perfor med by Elijah Birk, MD.   Electronically signed 12/24/2021 12:58:16PM The electronic signature indicates that the named Attending Pathologist has evaluated the specimen Technical component performed at [facility], [address] [place], [place], [place] [address] Lab: [phone] Dir: Jolene Schimke, MD, MMM  Professional component performed at [facility], [address] [place], [place], [place] [address] Lab: [phone] Dir: Beryle Quant, MD     This visit occurred during the SARS-CoV-2 public health emergency.  Safety protocols were in place, including screening questions prior to the visit, additional usage of staff PPE, and extensive cleaning of exam room while observing appropriate contact time as indicated for disinfecting solutions.   COVID 19 screen:  No recent travel or known exposure to COVID19 The patient denies respiratory symptoms of COVID 19 at this time. The importance of social distancing was discussed today.   Assessment and Plan    Problem List Items Addressed This Visit     Accidental fall    She reports the fall was accidental, no proceeding symptoms.  Was wearing new shoes, thicker sole, tripped.  Reviewed fall precautions  in detail with patient.      Acute insomnia - Primary    Can take 1 full tablet of Ambien in the short term while trying to  sleep with  a sling.  Make sure walking daily and not napping.  Consider change to trazodone if this is not working  for sleep.      Closed 4-part fracture of proximal humerus with malunion    Acute, followed by orthopedics Dr. Allena Katz.   Status post left shoulder arthroplasty on 12/20/2021.  She is currently wearing a sling and will start physical therapy        Kerby Nora, MD

## 2022-01-16 DIAGNOSIS — R278 Other lack of coordination: Secondary | ICD-10-CM | POA: Diagnosis not present

## 2022-01-16 DIAGNOSIS — M1990 Unspecified osteoarthritis, unspecified site: Secondary | ICD-10-CM | POA: Diagnosis not present

## 2022-01-16 DIAGNOSIS — N183 Chronic kidney disease, stage 3 unspecified: Secondary | ICD-10-CM | POA: Diagnosis not present

## 2022-01-16 DIAGNOSIS — M6281 Muscle weakness (generalized): Secondary | ICD-10-CM | POA: Diagnosis not present

## 2022-01-16 DIAGNOSIS — Z96612 Presence of left artificial shoulder joint: Secondary | ICD-10-CM | POA: Diagnosis not present

## 2022-01-16 DIAGNOSIS — Z741 Need for assistance with personal care: Secondary | ICD-10-CM | POA: Diagnosis not present

## 2022-01-16 DIAGNOSIS — S42242D 4-part fracture of surgical neck of left humerus, subsequent encounter for fracture with routine healing: Secondary | ICD-10-CM | POA: Diagnosis not present

## 2022-01-16 DIAGNOSIS — E785 Hyperlipidemia, unspecified: Secondary | ICD-10-CM | POA: Diagnosis not present

## 2022-01-16 DIAGNOSIS — R4189 Other symptoms and signs involving cognitive functions and awareness: Secondary | ICD-10-CM | POA: Diagnosis not present

## 2022-01-18 DIAGNOSIS — M6281 Muscle weakness (generalized): Secondary | ICD-10-CM | POA: Diagnosis not present

## 2022-01-18 DIAGNOSIS — M1990 Unspecified osteoarthritis, unspecified site: Secondary | ICD-10-CM | POA: Diagnosis not present

## 2022-01-18 DIAGNOSIS — S42242D 4-part fracture of surgical neck of left humerus, subsequent encounter for fracture with routine healing: Secondary | ICD-10-CM | POA: Diagnosis not present

## 2022-01-18 DIAGNOSIS — R278 Other lack of coordination: Secondary | ICD-10-CM | POA: Diagnosis not present

## 2022-01-18 DIAGNOSIS — Z741 Need for assistance with personal care: Secondary | ICD-10-CM | POA: Diagnosis not present

## 2022-01-18 DIAGNOSIS — E785 Hyperlipidemia, unspecified: Secondary | ICD-10-CM | POA: Diagnosis not present

## 2022-01-18 DIAGNOSIS — N183 Chronic kidney disease, stage 3 unspecified: Secondary | ICD-10-CM | POA: Diagnosis not present

## 2022-01-18 DIAGNOSIS — Z96612 Presence of left artificial shoulder joint: Secondary | ICD-10-CM | POA: Diagnosis not present

## 2022-01-18 DIAGNOSIS — R4189 Other symptoms and signs involving cognitive functions and awareness: Secondary | ICD-10-CM | POA: Diagnosis not present

## 2022-01-21 DIAGNOSIS — R278 Other lack of coordination: Secondary | ICD-10-CM | POA: Diagnosis not present

## 2022-01-21 DIAGNOSIS — R4189 Other symptoms and signs involving cognitive functions and awareness: Secondary | ICD-10-CM | POA: Diagnosis not present

## 2022-01-21 DIAGNOSIS — M6281 Muscle weakness (generalized): Secondary | ICD-10-CM | POA: Diagnosis not present

## 2022-01-21 DIAGNOSIS — S42242D 4-part fracture of surgical neck of left humerus, subsequent encounter for fracture with routine healing: Secondary | ICD-10-CM | POA: Diagnosis not present

## 2022-01-21 DIAGNOSIS — Z96612 Presence of left artificial shoulder joint: Secondary | ICD-10-CM | POA: Diagnosis not present

## 2022-01-21 DIAGNOSIS — M1990 Unspecified osteoarthritis, unspecified site: Secondary | ICD-10-CM | POA: Diagnosis not present

## 2022-01-21 DIAGNOSIS — Z741 Need for assistance with personal care: Secondary | ICD-10-CM | POA: Diagnosis not present

## 2022-01-21 DIAGNOSIS — E785 Hyperlipidemia, unspecified: Secondary | ICD-10-CM | POA: Diagnosis not present

## 2022-01-21 DIAGNOSIS — N183 Chronic kidney disease, stage 3 unspecified: Secondary | ICD-10-CM | POA: Diagnosis not present

## 2022-01-22 DIAGNOSIS — S42242D 4-part fracture of surgical neck of left humerus, subsequent encounter for fracture with routine healing: Secondary | ICD-10-CM | POA: Diagnosis not present

## 2022-01-22 DIAGNOSIS — M1990 Unspecified osteoarthritis, unspecified site: Secondary | ICD-10-CM | POA: Diagnosis not present

## 2022-01-22 DIAGNOSIS — N183 Chronic kidney disease, stage 3 unspecified: Secondary | ICD-10-CM | POA: Diagnosis not present

## 2022-01-22 DIAGNOSIS — R4189 Other symptoms and signs involving cognitive functions and awareness: Secondary | ICD-10-CM | POA: Diagnosis not present

## 2022-01-22 DIAGNOSIS — Z741 Need for assistance with personal care: Secondary | ICD-10-CM | POA: Diagnosis not present

## 2022-01-22 DIAGNOSIS — R278 Other lack of coordination: Secondary | ICD-10-CM | POA: Diagnosis not present

## 2022-01-22 DIAGNOSIS — Z96612 Presence of left artificial shoulder joint: Secondary | ICD-10-CM | POA: Diagnosis not present

## 2022-01-22 DIAGNOSIS — M6281 Muscle weakness (generalized): Secondary | ICD-10-CM | POA: Diagnosis not present

## 2022-01-22 DIAGNOSIS — E785 Hyperlipidemia, unspecified: Secondary | ICD-10-CM | POA: Diagnosis not present

## 2022-01-23 DIAGNOSIS — E785 Hyperlipidemia, unspecified: Secondary | ICD-10-CM | POA: Diagnosis not present

## 2022-01-23 DIAGNOSIS — M1990 Unspecified osteoarthritis, unspecified site: Secondary | ICD-10-CM | POA: Diagnosis not present

## 2022-01-23 DIAGNOSIS — R4189 Other symptoms and signs involving cognitive functions and awareness: Secondary | ICD-10-CM | POA: Diagnosis not present

## 2022-01-23 DIAGNOSIS — R278 Other lack of coordination: Secondary | ICD-10-CM | POA: Diagnosis not present

## 2022-01-23 DIAGNOSIS — M6281 Muscle weakness (generalized): Secondary | ICD-10-CM | POA: Diagnosis not present

## 2022-01-23 DIAGNOSIS — N183 Chronic kidney disease, stage 3 unspecified: Secondary | ICD-10-CM | POA: Diagnosis not present

## 2022-01-23 DIAGNOSIS — Z96612 Presence of left artificial shoulder joint: Secondary | ICD-10-CM | POA: Diagnosis not present

## 2022-01-23 DIAGNOSIS — Z741 Need for assistance with personal care: Secondary | ICD-10-CM | POA: Diagnosis not present

## 2022-01-23 DIAGNOSIS — S42242D 4-part fracture of surgical neck of left humerus, subsequent encounter for fracture with routine healing: Secondary | ICD-10-CM | POA: Diagnosis not present

## 2022-01-25 DIAGNOSIS — M1990 Unspecified osteoarthritis, unspecified site: Secondary | ICD-10-CM | POA: Diagnosis not present

## 2022-01-25 DIAGNOSIS — Z96612 Presence of left artificial shoulder joint: Secondary | ICD-10-CM | POA: Diagnosis not present

## 2022-01-25 DIAGNOSIS — N183 Chronic kidney disease, stage 3 unspecified: Secondary | ICD-10-CM | POA: Diagnosis not present

## 2022-01-25 DIAGNOSIS — R4189 Other symptoms and signs involving cognitive functions and awareness: Secondary | ICD-10-CM | POA: Diagnosis not present

## 2022-01-25 DIAGNOSIS — M6281 Muscle weakness (generalized): Secondary | ICD-10-CM | POA: Diagnosis not present

## 2022-01-25 DIAGNOSIS — R278 Other lack of coordination: Secondary | ICD-10-CM | POA: Diagnosis not present

## 2022-01-25 DIAGNOSIS — S42242D 4-part fracture of surgical neck of left humerus, subsequent encounter for fracture with routine healing: Secondary | ICD-10-CM | POA: Diagnosis not present

## 2022-01-25 DIAGNOSIS — E785 Hyperlipidemia, unspecified: Secondary | ICD-10-CM | POA: Diagnosis not present

## 2022-01-25 DIAGNOSIS — Z741 Need for assistance with personal care: Secondary | ICD-10-CM | POA: Diagnosis not present

## 2022-01-28 DIAGNOSIS — N183 Chronic kidney disease, stage 3 unspecified: Secondary | ICD-10-CM | POA: Diagnosis not present

## 2022-01-28 DIAGNOSIS — M1990 Unspecified osteoarthritis, unspecified site: Secondary | ICD-10-CM | POA: Diagnosis not present

## 2022-01-28 DIAGNOSIS — R278 Other lack of coordination: Secondary | ICD-10-CM | POA: Diagnosis not present

## 2022-01-28 DIAGNOSIS — Z96612 Presence of left artificial shoulder joint: Secondary | ICD-10-CM | POA: Diagnosis not present

## 2022-01-28 DIAGNOSIS — S42242D 4-part fracture of surgical neck of left humerus, subsequent encounter for fracture with routine healing: Secondary | ICD-10-CM | POA: Diagnosis not present

## 2022-01-28 DIAGNOSIS — R4189 Other symptoms and signs involving cognitive functions and awareness: Secondary | ICD-10-CM | POA: Diagnosis not present

## 2022-01-28 DIAGNOSIS — Z741 Need for assistance with personal care: Secondary | ICD-10-CM | POA: Diagnosis not present

## 2022-01-28 DIAGNOSIS — E785 Hyperlipidemia, unspecified: Secondary | ICD-10-CM | POA: Diagnosis not present

## 2022-01-28 DIAGNOSIS — M6281 Muscle weakness (generalized): Secondary | ICD-10-CM | POA: Diagnosis not present

## 2022-01-30 DIAGNOSIS — E785 Hyperlipidemia, unspecified: Secondary | ICD-10-CM | POA: Diagnosis not present

## 2022-01-30 DIAGNOSIS — R4189 Other symptoms and signs involving cognitive functions and awareness: Secondary | ICD-10-CM | POA: Diagnosis not present

## 2022-01-30 DIAGNOSIS — N183 Chronic kidney disease, stage 3 unspecified: Secondary | ICD-10-CM | POA: Diagnosis not present

## 2022-01-30 DIAGNOSIS — Z741 Need for assistance with personal care: Secondary | ICD-10-CM | POA: Diagnosis not present

## 2022-01-30 DIAGNOSIS — Z96612 Presence of left artificial shoulder joint: Secondary | ICD-10-CM | POA: Diagnosis not present

## 2022-01-30 DIAGNOSIS — R278 Other lack of coordination: Secondary | ICD-10-CM | POA: Diagnosis not present

## 2022-01-30 DIAGNOSIS — M1990 Unspecified osteoarthritis, unspecified site: Secondary | ICD-10-CM | POA: Diagnosis not present

## 2022-01-30 DIAGNOSIS — S42242D 4-part fracture of surgical neck of left humerus, subsequent encounter for fracture with routine healing: Secondary | ICD-10-CM | POA: Diagnosis not present

## 2022-01-30 DIAGNOSIS — M6281 Muscle weakness (generalized): Secondary | ICD-10-CM | POA: Diagnosis not present

## 2022-02-01 DIAGNOSIS — S42242D 4-part fracture of surgical neck of left humerus, subsequent encounter for fracture with routine healing: Secondary | ICD-10-CM | POA: Diagnosis not present

## 2022-02-01 DIAGNOSIS — R278 Other lack of coordination: Secondary | ICD-10-CM | POA: Diagnosis not present

## 2022-02-01 DIAGNOSIS — Z741 Need for assistance with personal care: Secondary | ICD-10-CM | POA: Diagnosis not present

## 2022-02-01 DIAGNOSIS — N183 Chronic kidney disease, stage 3 unspecified: Secondary | ICD-10-CM | POA: Diagnosis not present

## 2022-02-01 DIAGNOSIS — Z96612 Presence of left artificial shoulder joint: Secondary | ICD-10-CM | POA: Diagnosis not present

## 2022-02-01 DIAGNOSIS — R4189 Other symptoms and signs involving cognitive functions and awareness: Secondary | ICD-10-CM | POA: Diagnosis not present

## 2022-02-01 DIAGNOSIS — E785 Hyperlipidemia, unspecified: Secondary | ICD-10-CM | POA: Diagnosis not present

## 2022-02-01 DIAGNOSIS — M1990 Unspecified osteoarthritis, unspecified site: Secondary | ICD-10-CM | POA: Diagnosis not present

## 2022-02-01 DIAGNOSIS — M6281 Muscle weakness (generalized): Secondary | ICD-10-CM | POA: Diagnosis not present

## 2022-02-04 DIAGNOSIS — N183 Chronic kidney disease, stage 3 unspecified: Secondary | ICD-10-CM | POA: Diagnosis not present

## 2022-02-04 DIAGNOSIS — M1990 Unspecified osteoarthritis, unspecified site: Secondary | ICD-10-CM | POA: Diagnosis not present

## 2022-02-04 DIAGNOSIS — E785 Hyperlipidemia, unspecified: Secondary | ICD-10-CM | POA: Diagnosis not present

## 2022-02-04 DIAGNOSIS — Z741 Need for assistance with personal care: Secondary | ICD-10-CM | POA: Diagnosis not present

## 2022-02-04 DIAGNOSIS — M6281 Muscle weakness (generalized): Secondary | ICD-10-CM | POA: Diagnosis not present

## 2022-02-04 DIAGNOSIS — R278 Other lack of coordination: Secondary | ICD-10-CM | POA: Diagnosis not present

## 2022-02-04 DIAGNOSIS — S42242D 4-part fracture of surgical neck of left humerus, subsequent encounter for fracture with routine healing: Secondary | ICD-10-CM | POA: Diagnosis not present

## 2022-02-04 DIAGNOSIS — R4189 Other symptoms and signs involving cognitive functions and awareness: Secondary | ICD-10-CM | POA: Diagnosis not present

## 2022-02-04 DIAGNOSIS — Z96612 Presence of left artificial shoulder joint: Secondary | ICD-10-CM | POA: Diagnosis not present

## 2022-02-06 DIAGNOSIS — M1990 Unspecified osteoarthritis, unspecified site: Secondary | ICD-10-CM | POA: Diagnosis not present

## 2022-02-06 DIAGNOSIS — S42242D 4-part fracture of surgical neck of left humerus, subsequent encounter for fracture with routine healing: Secondary | ICD-10-CM | POA: Diagnosis not present

## 2022-02-06 DIAGNOSIS — N183 Chronic kidney disease, stage 3 unspecified: Secondary | ICD-10-CM | POA: Diagnosis not present

## 2022-02-06 DIAGNOSIS — M6281 Muscle weakness (generalized): Secondary | ICD-10-CM | POA: Diagnosis not present

## 2022-02-06 DIAGNOSIS — Z741 Need for assistance with personal care: Secondary | ICD-10-CM | POA: Diagnosis not present

## 2022-02-06 DIAGNOSIS — Z96612 Presence of left artificial shoulder joint: Secondary | ICD-10-CM | POA: Diagnosis not present

## 2022-02-06 DIAGNOSIS — E785 Hyperlipidemia, unspecified: Secondary | ICD-10-CM | POA: Diagnosis not present

## 2022-02-06 DIAGNOSIS — R278 Other lack of coordination: Secondary | ICD-10-CM | POA: Diagnosis not present

## 2022-02-06 DIAGNOSIS — R4189 Other symptoms and signs involving cognitive functions and awareness: Secondary | ICD-10-CM | POA: Diagnosis not present

## 2022-02-08 DIAGNOSIS — R278 Other lack of coordination: Secondary | ICD-10-CM | POA: Diagnosis not present

## 2022-02-08 DIAGNOSIS — E785 Hyperlipidemia, unspecified: Secondary | ICD-10-CM | POA: Diagnosis not present

## 2022-02-08 DIAGNOSIS — S42242D 4-part fracture of surgical neck of left humerus, subsequent encounter for fracture with routine healing: Secondary | ICD-10-CM | POA: Diagnosis not present

## 2022-02-08 DIAGNOSIS — R4189 Other symptoms and signs involving cognitive functions and awareness: Secondary | ICD-10-CM | POA: Diagnosis not present

## 2022-02-08 DIAGNOSIS — Z741 Need for assistance with personal care: Secondary | ICD-10-CM | POA: Diagnosis not present

## 2022-02-08 DIAGNOSIS — Z96612 Presence of left artificial shoulder joint: Secondary | ICD-10-CM | POA: Diagnosis not present

## 2022-02-08 DIAGNOSIS — N183 Chronic kidney disease, stage 3 unspecified: Secondary | ICD-10-CM | POA: Diagnosis not present

## 2022-02-08 DIAGNOSIS — M1990 Unspecified osteoarthritis, unspecified site: Secondary | ICD-10-CM | POA: Diagnosis not present

## 2022-02-08 DIAGNOSIS — M6281 Muscle weakness (generalized): Secondary | ICD-10-CM | POA: Diagnosis not present

## 2022-02-11 DIAGNOSIS — Z741 Need for assistance with personal care: Secondary | ICD-10-CM | POA: Diagnosis not present

## 2022-02-11 DIAGNOSIS — M1990 Unspecified osteoarthritis, unspecified site: Secondary | ICD-10-CM | POA: Diagnosis not present

## 2022-02-11 DIAGNOSIS — S42242D 4-part fracture of surgical neck of left humerus, subsequent encounter for fracture with routine healing: Secondary | ICD-10-CM | POA: Diagnosis not present

## 2022-02-11 DIAGNOSIS — N183 Chronic kidney disease, stage 3 unspecified: Secondary | ICD-10-CM | POA: Diagnosis not present

## 2022-02-11 DIAGNOSIS — R4189 Other symptoms and signs involving cognitive functions and awareness: Secondary | ICD-10-CM | POA: Diagnosis not present

## 2022-02-11 DIAGNOSIS — R278 Other lack of coordination: Secondary | ICD-10-CM | POA: Diagnosis not present

## 2022-02-11 DIAGNOSIS — Z96612 Presence of left artificial shoulder joint: Secondary | ICD-10-CM | POA: Diagnosis not present

## 2022-02-11 DIAGNOSIS — M6281 Muscle weakness (generalized): Secondary | ICD-10-CM | POA: Diagnosis not present

## 2022-02-11 DIAGNOSIS — E785 Hyperlipidemia, unspecified: Secondary | ICD-10-CM | POA: Diagnosis not present

## 2022-02-13 DIAGNOSIS — S42242D 4-part fracture of surgical neck of left humerus, subsequent encounter for fracture with routine healing: Secondary | ICD-10-CM | POA: Diagnosis not present

## 2022-02-13 DIAGNOSIS — E785 Hyperlipidemia, unspecified: Secondary | ICD-10-CM | POA: Diagnosis not present

## 2022-02-13 DIAGNOSIS — Z96612 Presence of left artificial shoulder joint: Secondary | ICD-10-CM | POA: Diagnosis not present

## 2022-02-13 DIAGNOSIS — N183 Chronic kidney disease, stage 3 unspecified: Secondary | ICD-10-CM | POA: Diagnosis not present

## 2022-02-13 DIAGNOSIS — R4189 Other symptoms and signs involving cognitive functions and awareness: Secondary | ICD-10-CM | POA: Diagnosis not present

## 2022-02-13 DIAGNOSIS — M1990 Unspecified osteoarthritis, unspecified site: Secondary | ICD-10-CM | POA: Diagnosis not present

## 2022-02-13 DIAGNOSIS — M6281 Muscle weakness (generalized): Secondary | ICD-10-CM | POA: Diagnosis not present

## 2022-02-13 DIAGNOSIS — R278 Other lack of coordination: Secondary | ICD-10-CM | POA: Diagnosis not present

## 2022-02-13 DIAGNOSIS — Z741 Need for assistance with personal care: Secondary | ICD-10-CM | POA: Diagnosis not present

## 2022-02-15 DIAGNOSIS — E785 Hyperlipidemia, unspecified: Secondary | ICD-10-CM | POA: Diagnosis not present

## 2022-02-15 DIAGNOSIS — M1990 Unspecified osteoarthritis, unspecified site: Secondary | ICD-10-CM | POA: Diagnosis not present

## 2022-02-15 DIAGNOSIS — R278 Other lack of coordination: Secondary | ICD-10-CM | POA: Diagnosis not present

## 2022-02-15 DIAGNOSIS — R4189 Other symptoms and signs involving cognitive functions and awareness: Secondary | ICD-10-CM | POA: Diagnosis not present

## 2022-02-15 DIAGNOSIS — S42242D 4-part fracture of surgical neck of left humerus, subsequent encounter for fracture with routine healing: Secondary | ICD-10-CM | POA: Diagnosis not present

## 2022-02-15 DIAGNOSIS — Z96612 Presence of left artificial shoulder joint: Secondary | ICD-10-CM | POA: Diagnosis not present

## 2022-02-15 DIAGNOSIS — N183 Chronic kidney disease, stage 3 unspecified: Secondary | ICD-10-CM | POA: Diagnosis not present

## 2022-02-15 DIAGNOSIS — Z741 Need for assistance with personal care: Secondary | ICD-10-CM | POA: Diagnosis not present

## 2022-02-15 DIAGNOSIS — M6281 Muscle weakness (generalized): Secondary | ICD-10-CM | POA: Diagnosis not present

## 2022-02-18 DIAGNOSIS — M6281 Muscle weakness (generalized): Secondary | ICD-10-CM | POA: Diagnosis not present

## 2022-02-18 DIAGNOSIS — Z741 Need for assistance with personal care: Secondary | ICD-10-CM | POA: Diagnosis not present

## 2022-02-18 DIAGNOSIS — S42242D 4-part fracture of surgical neck of left humerus, subsequent encounter for fracture with routine healing: Secondary | ICD-10-CM | POA: Diagnosis not present

## 2022-02-18 DIAGNOSIS — Z96612 Presence of left artificial shoulder joint: Secondary | ICD-10-CM | POA: Diagnosis not present

## 2022-02-18 DIAGNOSIS — R278 Other lack of coordination: Secondary | ICD-10-CM | POA: Diagnosis not present

## 2022-02-18 DIAGNOSIS — E785 Hyperlipidemia, unspecified: Secondary | ICD-10-CM | POA: Diagnosis not present

## 2022-02-18 DIAGNOSIS — M1990 Unspecified osteoarthritis, unspecified site: Secondary | ICD-10-CM | POA: Diagnosis not present

## 2022-02-18 DIAGNOSIS — N183 Chronic kidney disease, stage 3 unspecified: Secondary | ICD-10-CM | POA: Diagnosis not present

## 2022-02-18 DIAGNOSIS — R4189 Other symptoms and signs involving cognitive functions and awareness: Secondary | ICD-10-CM | POA: Diagnosis not present

## 2022-02-20 DIAGNOSIS — R4189 Other symptoms and signs involving cognitive functions and awareness: Secondary | ICD-10-CM | POA: Diagnosis not present

## 2022-02-20 DIAGNOSIS — S42242D 4-part fracture of surgical neck of left humerus, subsequent encounter for fracture with routine healing: Secondary | ICD-10-CM | POA: Diagnosis not present

## 2022-02-20 DIAGNOSIS — N183 Chronic kidney disease, stage 3 unspecified: Secondary | ICD-10-CM | POA: Diagnosis not present

## 2022-02-20 DIAGNOSIS — Z96612 Presence of left artificial shoulder joint: Secondary | ICD-10-CM | POA: Diagnosis not present

## 2022-02-20 DIAGNOSIS — Z741 Need for assistance with personal care: Secondary | ICD-10-CM | POA: Diagnosis not present

## 2022-02-20 DIAGNOSIS — M6281 Muscle weakness (generalized): Secondary | ICD-10-CM | POA: Diagnosis not present

## 2022-02-20 DIAGNOSIS — E785 Hyperlipidemia, unspecified: Secondary | ICD-10-CM | POA: Diagnosis not present

## 2022-02-20 DIAGNOSIS — M1990 Unspecified osteoarthritis, unspecified site: Secondary | ICD-10-CM | POA: Diagnosis not present

## 2022-02-20 DIAGNOSIS — R278 Other lack of coordination: Secondary | ICD-10-CM | POA: Diagnosis not present

## 2022-02-22 DIAGNOSIS — N183 Chronic kidney disease, stage 3 unspecified: Secondary | ICD-10-CM | POA: Diagnosis not present

## 2022-02-22 DIAGNOSIS — S42242D 4-part fracture of surgical neck of left humerus, subsequent encounter for fracture with routine healing: Secondary | ICD-10-CM | POA: Diagnosis not present

## 2022-02-22 DIAGNOSIS — R278 Other lack of coordination: Secondary | ICD-10-CM | POA: Diagnosis not present

## 2022-02-22 DIAGNOSIS — M1990 Unspecified osteoarthritis, unspecified site: Secondary | ICD-10-CM | POA: Diagnosis not present

## 2022-02-22 DIAGNOSIS — M6281 Muscle weakness (generalized): Secondary | ICD-10-CM | POA: Diagnosis not present

## 2022-02-22 DIAGNOSIS — Z741 Need for assistance with personal care: Secondary | ICD-10-CM | POA: Diagnosis not present

## 2022-02-22 DIAGNOSIS — E785 Hyperlipidemia, unspecified: Secondary | ICD-10-CM | POA: Diagnosis not present

## 2022-02-22 DIAGNOSIS — R4189 Other symptoms and signs involving cognitive functions and awareness: Secondary | ICD-10-CM | POA: Diagnosis not present

## 2022-02-22 DIAGNOSIS — Z96612 Presence of left artificial shoulder joint: Secondary | ICD-10-CM | POA: Diagnosis not present

## 2022-02-25 ENCOUNTER — Telehealth: Payer: Self-pay

## 2022-02-25 DIAGNOSIS — G4700 Insomnia, unspecified: Secondary | ICD-10-CM | POA: Insufficient documentation

## 2022-02-25 DIAGNOSIS — W19XXXA Unspecified fall, initial encounter: Secondary | ICD-10-CM | POA: Insufficient documentation

## 2022-02-25 NOTE — Assessment & Plan Note (Signed)
Acute, followed by orthopedics Dr. Allena Katz.  Status post left shoulder arthroplasty on 12/20/2021.  She is currently wearing a sling and will start physical therapy

## 2022-02-25 NOTE — Assessment & Plan Note (Signed)
She reports the fall was accidental, no proceeding symptoms.  Was wearing new shoes, thicker sole, tripped.  Reviewed fall precautions in detail with patient.

## 2022-02-25 NOTE — Assessment & Plan Note (Signed)
Can take 1 full tablet of Ambien in the short term while trying to  sleep with  a sling.  Make sure walking daily and not napping.  Consider change to trazodone if this is not working  for sleep.

## 2022-02-25 NOTE — Telephone Encounter (Signed)
Noted.  FYI to Dr. Bedsole. 

## 2022-02-25 NOTE — Telephone Encounter (Signed)
Patient called in stating that she is scheduled for bone density scan on 04/23/22 at [facility].

## 2022-03-04 DIAGNOSIS — R278 Other lack of coordination: Secondary | ICD-10-CM | POA: Diagnosis not present

## 2022-03-04 DIAGNOSIS — S42242D 4-part fracture of surgical neck of left humerus, subsequent encounter for fracture with routine healing: Secondary | ICD-10-CM | POA: Diagnosis not present

## 2022-03-04 DIAGNOSIS — E785 Hyperlipidemia, unspecified: Secondary | ICD-10-CM | POA: Diagnosis not present

## 2022-03-04 DIAGNOSIS — Z741 Need for assistance with personal care: Secondary | ICD-10-CM | POA: Diagnosis not present

## 2022-03-04 DIAGNOSIS — M6281 Muscle weakness (generalized): Secondary | ICD-10-CM | POA: Diagnosis not present

## 2022-03-04 DIAGNOSIS — Z96612 Presence of left artificial shoulder joint: Secondary | ICD-10-CM | POA: Diagnosis not present

## 2022-03-04 DIAGNOSIS — N183 Chronic kidney disease, stage 3 unspecified: Secondary | ICD-10-CM | POA: Diagnosis not present

## 2022-03-04 DIAGNOSIS — R4189 Other symptoms and signs involving cognitive functions and awareness: Secondary | ICD-10-CM | POA: Diagnosis not present

## 2022-03-04 DIAGNOSIS — M1990 Unspecified osteoarthritis, unspecified site: Secondary | ICD-10-CM | POA: Diagnosis not present

## 2022-03-06 DIAGNOSIS — E785 Hyperlipidemia, unspecified: Secondary | ICD-10-CM | POA: Diagnosis not present

## 2022-03-06 DIAGNOSIS — N183 Chronic kidney disease, stage 3 unspecified: Secondary | ICD-10-CM | POA: Diagnosis not present

## 2022-03-06 DIAGNOSIS — S42242D 4-part fracture of surgical neck of left humerus, subsequent encounter for fracture with routine healing: Secondary | ICD-10-CM | POA: Diagnosis not present

## 2022-03-06 DIAGNOSIS — Z741 Need for assistance with personal care: Secondary | ICD-10-CM | POA: Diagnosis not present

## 2022-03-06 DIAGNOSIS — M1990 Unspecified osteoarthritis, unspecified site: Secondary | ICD-10-CM | POA: Diagnosis not present

## 2022-03-06 DIAGNOSIS — M6281 Muscle weakness (generalized): Secondary | ICD-10-CM | POA: Diagnosis not present

## 2022-03-06 DIAGNOSIS — R4189 Other symptoms and signs involving cognitive functions and awareness: Secondary | ICD-10-CM | POA: Diagnosis not present

## 2022-03-06 DIAGNOSIS — Z96612 Presence of left artificial shoulder joint: Secondary | ICD-10-CM | POA: Diagnosis not present

## 2022-03-06 DIAGNOSIS — R278 Other lack of coordination: Secondary | ICD-10-CM | POA: Diagnosis not present

## 2022-03-08 DIAGNOSIS — S42242D 4-part fracture of surgical neck of left humerus, subsequent encounter for fracture with routine healing: Secondary | ICD-10-CM | POA: Diagnosis not present

## 2022-03-08 DIAGNOSIS — R4189 Other symptoms and signs involving cognitive functions and awareness: Secondary | ICD-10-CM | POA: Diagnosis not present

## 2022-03-08 DIAGNOSIS — M1990 Unspecified osteoarthritis, unspecified site: Secondary | ICD-10-CM | POA: Diagnosis not present

## 2022-03-08 DIAGNOSIS — Z741 Need for assistance with personal care: Secondary | ICD-10-CM | POA: Diagnosis not present

## 2022-03-08 DIAGNOSIS — M6281 Muscle weakness (generalized): Secondary | ICD-10-CM | POA: Diagnosis not present

## 2022-03-08 DIAGNOSIS — R278 Other lack of coordination: Secondary | ICD-10-CM | POA: Diagnosis not present

## 2022-03-08 DIAGNOSIS — E785 Hyperlipidemia, unspecified: Secondary | ICD-10-CM | POA: Diagnosis not present

## 2022-03-08 DIAGNOSIS — N183 Chronic kidney disease, stage 3 unspecified: Secondary | ICD-10-CM | POA: Diagnosis not present

## 2022-03-08 DIAGNOSIS — Z96612 Presence of left artificial shoulder joint: Secondary | ICD-10-CM | POA: Diagnosis not present

## 2022-03-11 DIAGNOSIS — R278 Other lack of coordination: Secondary | ICD-10-CM | POA: Diagnosis not present

## 2022-03-11 DIAGNOSIS — M6281 Muscle weakness (generalized): Secondary | ICD-10-CM | POA: Diagnosis not present

## 2022-03-11 DIAGNOSIS — Z96612 Presence of left artificial shoulder joint: Secondary | ICD-10-CM | POA: Diagnosis not present

## 2022-03-11 DIAGNOSIS — R4189 Other symptoms and signs involving cognitive functions and awareness: Secondary | ICD-10-CM | POA: Diagnosis not present

## 2022-03-11 DIAGNOSIS — E785 Hyperlipidemia, unspecified: Secondary | ICD-10-CM | POA: Diagnosis not present

## 2022-03-11 DIAGNOSIS — S42242D 4-part fracture of surgical neck of left humerus, subsequent encounter for fracture with routine healing: Secondary | ICD-10-CM | POA: Diagnosis not present

## 2022-03-11 DIAGNOSIS — M1990 Unspecified osteoarthritis, unspecified site: Secondary | ICD-10-CM | POA: Diagnosis not present

## 2022-03-11 DIAGNOSIS — Z741 Need for assistance with personal care: Secondary | ICD-10-CM | POA: Diagnosis not present

## 2022-03-11 DIAGNOSIS — N183 Chronic kidney disease, stage 3 unspecified: Secondary | ICD-10-CM | POA: Diagnosis not present

## 2022-03-13 DIAGNOSIS — Z741 Need for assistance with personal care: Secondary | ICD-10-CM | POA: Diagnosis not present

## 2022-03-13 DIAGNOSIS — S42242D 4-part fracture of surgical neck of left humerus, subsequent encounter for fracture with routine healing: Secondary | ICD-10-CM | POA: Diagnosis not present

## 2022-03-13 DIAGNOSIS — Z96612 Presence of left artificial shoulder joint: Secondary | ICD-10-CM | POA: Diagnosis not present

## 2022-03-13 DIAGNOSIS — R4189 Other symptoms and signs involving cognitive functions and awareness: Secondary | ICD-10-CM | POA: Diagnosis not present

## 2022-03-13 DIAGNOSIS — R278 Other lack of coordination: Secondary | ICD-10-CM | POA: Diagnosis not present

## 2022-03-13 DIAGNOSIS — M1990 Unspecified osteoarthritis, unspecified site: Secondary | ICD-10-CM | POA: Diagnosis not present

## 2022-03-13 DIAGNOSIS — E785 Hyperlipidemia, unspecified: Secondary | ICD-10-CM | POA: Diagnosis not present

## 2022-03-13 DIAGNOSIS — M6281 Muscle weakness (generalized): Secondary | ICD-10-CM | POA: Diagnosis not present

## 2022-03-13 DIAGNOSIS — N183 Chronic kidney disease, stage 3 unspecified: Secondary | ICD-10-CM | POA: Diagnosis not present

## 2022-03-14 DIAGNOSIS — M1990 Unspecified osteoarthritis, unspecified site: Secondary | ICD-10-CM | POA: Diagnosis not present

## 2022-03-14 DIAGNOSIS — S42242D 4-part fracture of surgical neck of left humerus, subsequent encounter for fracture with routine healing: Secondary | ICD-10-CM | POA: Diagnosis not present

## 2022-03-14 DIAGNOSIS — E785 Hyperlipidemia, unspecified: Secondary | ICD-10-CM | POA: Diagnosis not present

## 2022-03-14 DIAGNOSIS — R278 Other lack of coordination: Secondary | ICD-10-CM | POA: Diagnosis not present

## 2022-03-14 DIAGNOSIS — N183 Chronic kidney disease, stage 3 unspecified: Secondary | ICD-10-CM | POA: Diagnosis not present

## 2022-03-14 DIAGNOSIS — Z96612 Presence of left artificial shoulder joint: Secondary | ICD-10-CM | POA: Diagnosis not present

## 2022-03-14 DIAGNOSIS — Z741 Need for assistance with personal care: Secondary | ICD-10-CM | POA: Diagnosis not present

## 2022-03-14 DIAGNOSIS — M6281 Muscle weakness (generalized): Secondary | ICD-10-CM | POA: Diagnosis not present

## 2022-03-14 DIAGNOSIS — R4189 Other symptoms and signs involving cognitive functions and awareness: Secondary | ICD-10-CM | POA: Diagnosis not present

## 2022-03-22 DIAGNOSIS — M1990 Unspecified osteoarthritis, unspecified site: Secondary | ICD-10-CM | POA: Diagnosis not present

## 2022-03-22 DIAGNOSIS — Z96612 Presence of left artificial shoulder joint: Secondary | ICD-10-CM | POA: Diagnosis not present

## 2022-03-22 DIAGNOSIS — R278 Other lack of coordination: Secondary | ICD-10-CM | POA: Diagnosis not present

## 2022-03-22 DIAGNOSIS — S42242D 4-part fracture of surgical neck of left humerus, subsequent encounter for fracture with routine healing: Secondary | ICD-10-CM | POA: Diagnosis not present

## 2022-03-22 DIAGNOSIS — M6281 Muscle weakness (generalized): Secondary | ICD-10-CM | POA: Diagnosis not present

## 2022-03-22 DIAGNOSIS — R4189 Other symptoms and signs involving cognitive functions and awareness: Secondary | ICD-10-CM | POA: Diagnosis not present

## 2022-03-22 DIAGNOSIS — E785 Hyperlipidemia, unspecified: Secondary | ICD-10-CM | POA: Diagnosis not present

## 2022-03-22 DIAGNOSIS — N183 Chronic kidney disease, stage 3 unspecified: Secondary | ICD-10-CM | POA: Diagnosis not present

## 2022-03-22 DIAGNOSIS — Z741 Need for assistance with personal care: Secondary | ICD-10-CM | POA: Diagnosis not present

## 2022-03-25 DIAGNOSIS — E785 Hyperlipidemia, unspecified: Secondary | ICD-10-CM | POA: Diagnosis not present

## 2022-03-25 DIAGNOSIS — M1990 Unspecified osteoarthritis, unspecified site: Secondary | ICD-10-CM | POA: Diagnosis not present

## 2022-03-25 DIAGNOSIS — S42242D 4-part fracture of surgical neck of left humerus, subsequent encounter for fracture with routine healing: Secondary | ICD-10-CM | POA: Diagnosis not present

## 2022-03-25 DIAGNOSIS — N183 Chronic kidney disease, stage 3 unspecified: Secondary | ICD-10-CM | POA: Diagnosis not present

## 2022-03-25 DIAGNOSIS — M6281 Muscle weakness (generalized): Secondary | ICD-10-CM | POA: Diagnosis not present

## 2022-03-25 DIAGNOSIS — Z741 Need for assistance with personal care: Secondary | ICD-10-CM | POA: Diagnosis not present

## 2022-03-25 DIAGNOSIS — Z96612 Presence of left artificial shoulder joint: Secondary | ICD-10-CM | POA: Diagnosis not present

## 2022-03-25 DIAGNOSIS — R278 Other lack of coordination: Secondary | ICD-10-CM | POA: Diagnosis not present

## 2022-03-25 DIAGNOSIS — R4189 Other symptoms and signs involving cognitive functions and awareness: Secondary | ICD-10-CM | POA: Diagnosis not present

## 2022-03-27 DIAGNOSIS — Z96612 Presence of left artificial shoulder joint: Secondary | ICD-10-CM | POA: Diagnosis not present

## 2022-03-27 DIAGNOSIS — S42242D 4-part fracture of surgical neck of left humerus, subsequent encounter for fracture with routine healing: Secondary | ICD-10-CM | POA: Diagnosis not present

## 2022-03-27 DIAGNOSIS — M1990 Unspecified osteoarthritis, unspecified site: Secondary | ICD-10-CM | POA: Diagnosis not present

## 2022-03-27 DIAGNOSIS — N183 Chronic kidney disease, stage 3 unspecified: Secondary | ICD-10-CM | POA: Diagnosis not present

## 2022-03-27 DIAGNOSIS — M6281 Muscle weakness (generalized): Secondary | ICD-10-CM | POA: Diagnosis not present

## 2022-03-27 DIAGNOSIS — E785 Hyperlipidemia, unspecified: Secondary | ICD-10-CM | POA: Diagnosis not present

## 2022-03-27 DIAGNOSIS — R278 Other lack of coordination: Secondary | ICD-10-CM | POA: Diagnosis not present

## 2022-03-27 DIAGNOSIS — Z741 Need for assistance with personal care: Secondary | ICD-10-CM | POA: Diagnosis not present

## 2022-03-27 DIAGNOSIS — R4189 Other symptoms and signs involving cognitive functions and awareness: Secondary | ICD-10-CM | POA: Diagnosis not present

## 2022-04-11 DIAGNOSIS — N1831 Chronic kidney disease, stage 3a: Secondary | ICD-10-CM | POA: Diagnosis not present

## 2022-04-12 DIAGNOSIS — R4189 Other symptoms and signs involving cognitive functions and awareness: Secondary | ICD-10-CM | POA: Diagnosis not present

## 2022-04-12 DIAGNOSIS — S42242D 4-part fracture of surgical neck of left humerus, subsequent encounter for fracture with routine healing: Secondary | ICD-10-CM | POA: Diagnosis not present

## 2022-04-12 DIAGNOSIS — R278 Other lack of coordination: Secondary | ICD-10-CM | POA: Diagnosis not present

## 2022-04-12 DIAGNOSIS — N183 Chronic kidney disease, stage 3 unspecified: Secondary | ICD-10-CM | POA: Diagnosis not present

## 2022-04-12 DIAGNOSIS — E785 Hyperlipidemia, unspecified: Secondary | ICD-10-CM | POA: Diagnosis not present

## 2022-04-12 DIAGNOSIS — Z741 Need for assistance with personal care: Secondary | ICD-10-CM | POA: Diagnosis not present

## 2022-04-12 DIAGNOSIS — Z96612 Presence of left artificial shoulder joint: Secondary | ICD-10-CM | POA: Diagnosis not present

## 2022-04-12 DIAGNOSIS — M1990 Unspecified osteoarthritis, unspecified site: Secondary | ICD-10-CM | POA: Diagnosis not present

## 2022-04-12 DIAGNOSIS — M6281 Muscle weakness (generalized): Secondary | ICD-10-CM | POA: Diagnosis not present

## 2022-04-15 DIAGNOSIS — E785 Hyperlipidemia, unspecified: Secondary | ICD-10-CM | POA: Diagnosis not present

## 2022-04-15 DIAGNOSIS — R4189 Other symptoms and signs involving cognitive functions and awareness: Secondary | ICD-10-CM | POA: Diagnosis not present

## 2022-04-15 DIAGNOSIS — N2581 Secondary hyperparathyroidism of renal origin: Secondary | ICD-10-CM | POA: Diagnosis not present

## 2022-04-15 DIAGNOSIS — I10 Essential (primary) hypertension: Secondary | ICD-10-CM | POA: Diagnosis not present

## 2022-04-15 DIAGNOSIS — N1831 Chronic kidney disease, stage 3a: Secondary | ICD-10-CM | POA: Diagnosis not present

## 2022-04-15 DIAGNOSIS — M6281 Muscle weakness (generalized): Secondary | ICD-10-CM | POA: Diagnosis not present

## 2022-04-15 DIAGNOSIS — S42242D 4-part fracture of surgical neck of left humerus, subsequent encounter for fracture with routine healing: Secondary | ICD-10-CM | POA: Diagnosis not present

## 2022-04-15 DIAGNOSIS — R278 Other lack of coordination: Secondary | ICD-10-CM | POA: Diagnosis not present

## 2022-04-15 DIAGNOSIS — M1990 Unspecified osteoarthritis, unspecified site: Secondary | ICD-10-CM | POA: Diagnosis not present

## 2022-04-15 DIAGNOSIS — Z741 Need for assistance with personal care: Secondary | ICD-10-CM | POA: Diagnosis not present

## 2022-04-15 DIAGNOSIS — N183 Chronic kidney disease, stage 3 unspecified: Secondary | ICD-10-CM | POA: Diagnosis not present

## 2022-04-15 DIAGNOSIS — D631 Anemia in chronic kidney disease: Secondary | ICD-10-CM | POA: Diagnosis not present

## 2022-04-15 DIAGNOSIS — Z96612 Presence of left artificial shoulder joint: Secondary | ICD-10-CM | POA: Diagnosis not present

## 2022-04-16 ENCOUNTER — Ambulatory Visit (INDEPENDENT_AMBULATORY_CARE_PROVIDER_SITE_OTHER): Payer: Medicare PPO | Admitting: Cardiovascular Disease

## 2022-04-16 ENCOUNTER — Encounter: Payer: Self-pay | Admitting: Cardiovascular Disease

## 2022-04-16 VITALS — BP 120/70 | HR 52 | Ht 59.0 in | Wt 150.4 lb

## 2022-04-16 DIAGNOSIS — E785 Hyperlipidemia, unspecified: Secondary | ICD-10-CM

## 2022-04-16 DIAGNOSIS — I10 Essential (primary) hypertension: Secondary | ICD-10-CM

## 2022-04-16 DIAGNOSIS — I471 Supraventricular tachycardia, unspecified: Secondary | ICD-10-CM

## 2022-04-16 DIAGNOSIS — R001 Bradycardia, unspecified: Secondary | ICD-10-CM

## 2022-04-16 MED ORDER — EZETIMIBE-SIMVASTATIN 10-20 MG PO TABS: 1.0000 | ORAL_TABLET | Freq: Every morning | ORAL | 3 refills | Status: DC

## 2022-04-16 NOTE — Progress Notes (Unsigned)
Cardiology Office Note   Date:  04/17/2022   ID:  Natasha Sawyer, DOB 1941-01-25, MRN [id number]  PCP:  Excell Seltzer, MD  Cardiologist:   Lorine Bears, MD   Chief Complaint  Patient presents with   Other    12 Month fu no complaints today. Meds reviewed verbally with pt.      History of Present Illness: Natasha Sawyer is a 81 y.o. female who is here today for follow-up visit regarding palpitations due to short SVT.   She has multiple chronic medical conditions including essential hypertension, hyperlipidemia and mild chronic kidney disease. He was initially seen in 2013 for palpitations, atypical chest pain and shortness of breath.  She underwent a treadmill stress test which was normal.  Echocardiogram showed normal ejection fraction with no significant valvular abnormalities.  48-hour Holter monitor showed normal sinus rhythm with frequent PACs and no other significant arrhythmia.  She was seen again in 2019 for recurrent palpitations.  She had a Zio monitor which showed normal sinus rhythm with a 4 beat run of wide-complex tachycardia, 18 episodes of SVT the longest lasted 16 beats and occasional PACs and PVCs.  She underwent a treadmill stress test in December 2019 which showed no evidence of ischemia.  She was able to exercise for 3 minutes and 39 seconds.  Due to baseline bradycardia, she was not started on any medications but was given propranolol to be used as needed.  She fell in April and broke her left shoulder which required surgery.  She recovered since then.  She denies dizziness.  No chest pain or shortness of breath.  She takes her medications regularly.  Past Medical History:  Diagnosis Date   Arthritis    Ascending aorta dilation (HCC)    Chronic kidney disease    Dysrhythmia    Hyperlipidemia    Hypertension    Palpitations    Premature beats     Past Surgical History:  Procedure Laterality Date   APPENDECTOMY     BICEPT TENODESIS Left 12/20/2021    Procedure: Left reverse shoulder arthroplasty for proximal humerus fracture, biceps tenodesis;  Surgeon: Signa Kell, MD;  Location: [facility] ORS;  Service: Orthopedics;  Laterality: Left;   CESAREAN SECTION     x3   EYE SURGERY     cataract and eyelid   HERNIA REPAIR     REVERSE SHOULDER ARTHROPLASTY Left 12/20/2021   Procedure: Left reverse shoulder arthroplasty for proximal humerus fracture, biceps tenodesis;  Surgeon: Signa Kell, MD;  Location: [facility] ORS;  Service: Orthopedics;  Laterality: Left;     Current Outpatient Medications  Medication Sig Dispense Refill   acetaminophen (TYLENOL) 650 MG CR tablet Take 650 mg by mouth every 8 (eight) hours as needed for pain.     amLODipine (NORVASC) 10 MG tablet Take 10 mg by mouth in the morning.     aspirin EC 325 MG EC tablet Take 1 tablet (325 mg total) by mouth daily. 45 tablet 0   Cholecalciferol (VITAMIN D3) 50 MCG (2000 UT) TABS Take 2,000 Units by mouth in the morning.     Cyanocobalamin (B-12 PO) Take 1,000 mcg by mouth in the morning.     enalapril (VASOTEC) 20 MG tablet Take 20 mg by mouth in the morning.     oxyCODONE (OXY IR/ROXICODONE) 5 MG immediate release tablet Take 0.5-1 tablets (2.5-5 mg total) by mouth every 4 (four) hours as needed for moderate pain (pain score 4-6). 30 tablet 0  propranolol (INDERAL) 10 MG tablet Take 10 mg by mouth daily as needed (palpitations.).     zolpidem (AMBIEN) 5 MG tablet Take 2.5 mg by mouth at bedtime as needed for sleep.     ezetimibe-simvastatin (VYTORIN) 10-20 MG tablet Take 1 tablet by mouth in the morning. 90 tablet 3   No current facility-administered medications for this visit.    Allergies:   Patient has no known allergies.    Social History:  The patient  reports that she has never smoked. She has never used smokeless tobacco. She reports that she does not drink alcohol and does not use drugs.   Family History:  The patient's family history includes Breast cancer (age of onset: 72)  in her cousin; Diabetes in her mother; Stroke in her father.    ROS:  Please see the history of present illness.   Otherwise, review of systems are positive for none.   All other systems are reviewed and negative.    PHYSICAL EXAM: VS:  BP 120/70 (BP Location: Left Arm, Patient Position: Sitting, Cuff Size: Normal)   Pulse (!) 52   Ht 4\' 11"  (1.499 m)   Wt 150 lb 6 oz (68.2 kg)   SpO2 98%   BMI 30.37 kg/m  , BMI Body mass index is 30.37 kg/m. GEN: Well nourished, well developed, in no acute distress  HEENT: normal  Neck: no JVD, carotid bruits, or masses Cardiac: RRR; no murmurs, rubs, or gallops,no edema  Respiratory:  clear to auscultation bilaterally, normal work of breathing GI: soft, nontender, nondistended, + BS MS: no deformity or atrophy  Skin: warm and dry, no rash Neuro:  Strength and sensation are intact Psych: euthymic mood, full affect   EKG:  EKG is ordered today. The ekg ordered today demonstrates sinus bradycardia with no significant ST changes.   Recent Labs: 12/19/2021: ALT 10 12/21/2021: BUN 28; Creatinine, Ser 1.16; Hemoglobin 9.4; Platelets 393; Potassium 4.6; Sodium 139    Lipid Panel    Component Value Date/Time   CHOL 158 11/08/2021 0858   TRIG 121.0 11/08/2021 0858   TRIG 68 12/12/2009 1413   HDL 76.20 11/08/2021 0858   CHOLHDL 2 11/08/2021 0858   VLDL 24.2 11/08/2021 0858   LDLCALC 58 11/08/2021 0858      Wt Readings from Last 3 Encounters:  04/16/22 150 lb 6 oz (68.2 kg)  01/15/22 148 lb 8 oz (67.4 kg)  12/20/21 154 lb 5.2 oz (70 kg)           08/07/2018    4:07 PM  PAD Screen  Previous PAD dx? No  Previous surgical procedure? No  Pain with walking? No  Feet/toe relief with dangling? No  Painful, non-healing ulcers? No  Extremities discolored? No      ASSESSMENT AND PLAN:  1.  Paroxysmal supraventricular tachycardia: She has mild symptoms overall and does not require any medication.  She does have propranolol to be used  as needed but has not required this medication.  2.  Sinus bradycardia: This has been a chronic issue for her and she does not appear to be symptomatic from this.  3.  Essential hypertension: Blood pressure is well controlled on current medications.  4.  Hyperlipidemia: I reviewed most recent lipid profile which showed an LDL of 58.  I refilled Vytorin.  5.  Chronic kidney disease stable overall and followed by nephrology.  Most recent creatinine was 1.16 with a GFR of 48.   Disposition:   FU in  12 months.  Signed,  Lorine Bears, MD  04/17/2022 4:34 PM    [facility]

## 2022-04-16 NOTE — Patient Instructions (Signed)

## 2022-04-17 DIAGNOSIS — M6281 Muscle weakness (generalized): Secondary | ICD-10-CM | POA: Diagnosis not present

## 2022-04-17 DIAGNOSIS — Z741 Need for assistance with personal care: Secondary | ICD-10-CM | POA: Diagnosis not present

## 2022-04-17 DIAGNOSIS — N183 Chronic kidney disease, stage 3 unspecified: Secondary | ICD-10-CM | POA: Diagnosis not present

## 2022-04-17 DIAGNOSIS — M1990 Unspecified osteoarthritis, unspecified site: Secondary | ICD-10-CM | POA: Diagnosis not present

## 2022-04-17 DIAGNOSIS — R4189 Other symptoms and signs involving cognitive functions and awareness: Secondary | ICD-10-CM | POA: Diagnosis not present

## 2022-04-17 DIAGNOSIS — R278 Other lack of coordination: Secondary | ICD-10-CM | POA: Diagnosis not present

## 2022-04-17 DIAGNOSIS — Z96612 Presence of left artificial shoulder joint: Secondary | ICD-10-CM | POA: Diagnosis not present

## 2022-04-17 DIAGNOSIS — E785 Hyperlipidemia, unspecified: Secondary | ICD-10-CM | POA: Diagnosis not present

## 2022-04-17 DIAGNOSIS — S42242D 4-part fracture of surgical neck of left humerus, subsequent encounter for fracture with routine healing: Secondary | ICD-10-CM | POA: Diagnosis not present

## 2022-04-19 DIAGNOSIS — Z96612 Presence of left artificial shoulder joint: Secondary | ICD-10-CM | POA: Diagnosis not present

## 2022-04-19 DIAGNOSIS — E785 Hyperlipidemia, unspecified: Secondary | ICD-10-CM | POA: Diagnosis not present

## 2022-04-19 DIAGNOSIS — R278 Other lack of coordination: Secondary | ICD-10-CM | POA: Diagnosis not present

## 2022-04-19 DIAGNOSIS — Z741 Need for assistance with personal care: Secondary | ICD-10-CM | POA: Diagnosis not present

## 2022-04-19 DIAGNOSIS — R4189 Other symptoms and signs involving cognitive functions and awareness: Secondary | ICD-10-CM | POA: Diagnosis not present

## 2022-04-19 DIAGNOSIS — N183 Chronic kidney disease, stage 3 unspecified: Secondary | ICD-10-CM | POA: Diagnosis not present

## 2022-04-19 DIAGNOSIS — M6281 Muscle weakness (generalized): Secondary | ICD-10-CM | POA: Diagnosis not present

## 2022-04-19 DIAGNOSIS — M1990 Unspecified osteoarthritis, unspecified site: Secondary | ICD-10-CM | POA: Diagnosis not present

## 2022-04-19 DIAGNOSIS — S42242D 4-part fracture of surgical neck of left humerus, subsequent encounter for fracture with routine healing: Secondary | ICD-10-CM | POA: Diagnosis not present

## 2022-04-22 DIAGNOSIS — S42242D 4-part fracture of surgical neck of left humerus, subsequent encounter for fracture with routine healing: Secondary | ICD-10-CM | POA: Diagnosis not present

## 2022-04-22 DIAGNOSIS — R4189 Other symptoms and signs involving cognitive functions and awareness: Secondary | ICD-10-CM | POA: Diagnosis not present

## 2022-04-22 DIAGNOSIS — Z741 Need for assistance with personal care: Secondary | ICD-10-CM | POA: Diagnosis not present

## 2022-04-22 DIAGNOSIS — N183 Chronic kidney disease, stage 3 unspecified: Secondary | ICD-10-CM | POA: Diagnosis not present

## 2022-04-22 DIAGNOSIS — Z96612 Presence of left artificial shoulder joint: Secondary | ICD-10-CM | POA: Diagnosis not present

## 2022-04-22 DIAGNOSIS — E785 Hyperlipidemia, unspecified: Secondary | ICD-10-CM | POA: Diagnosis not present

## 2022-04-22 DIAGNOSIS — R278 Other lack of coordination: Secondary | ICD-10-CM | POA: Diagnosis not present

## 2022-04-22 DIAGNOSIS — M1990 Unspecified osteoarthritis, unspecified site: Secondary | ICD-10-CM | POA: Diagnosis not present

## 2022-04-22 DIAGNOSIS — M6281 Muscle weakness (generalized): Secondary | ICD-10-CM | POA: Diagnosis not present

## 2022-04-23 ENCOUNTER — Ambulatory Visit
Admission: RE | Admit: 2022-04-23 | Discharge: 2022-04-23 | Disposition: A | Discharge status: Home / Self Care | Payer: Medicare PPO | Source: Ambulatory Visit | Attending: Family Medicine | Admitting: Family Medicine

## 2022-04-23 DIAGNOSIS — Z780 Asymptomatic menopausal state: Secondary | ICD-10-CM | POA: Diagnosis not present

## 2022-04-23 DIAGNOSIS — M85852 Other specified disorders of bone density and structure, left thigh: Secondary | ICD-10-CM | POA: Diagnosis not present

## 2022-04-24 DIAGNOSIS — Z741 Need for assistance with personal care: Secondary | ICD-10-CM | POA: Diagnosis not present

## 2022-04-24 DIAGNOSIS — E785 Hyperlipidemia, unspecified: Secondary | ICD-10-CM | POA: Diagnosis not present

## 2022-04-24 DIAGNOSIS — M6281 Muscle weakness (generalized): Secondary | ICD-10-CM | POA: Diagnosis not present

## 2022-04-24 DIAGNOSIS — Z96612 Presence of left artificial shoulder joint: Secondary | ICD-10-CM | POA: Diagnosis not present

## 2022-04-24 DIAGNOSIS — M1990 Unspecified osteoarthritis, unspecified site: Secondary | ICD-10-CM | POA: Diagnosis not present

## 2022-04-24 DIAGNOSIS — R278 Other lack of coordination: Secondary | ICD-10-CM | POA: Diagnosis not present

## 2022-04-24 DIAGNOSIS — N183 Chronic kidney disease, stage 3 unspecified: Secondary | ICD-10-CM | POA: Diagnosis not present

## 2022-04-24 DIAGNOSIS — R4189 Other symptoms and signs involving cognitive functions and awareness: Secondary | ICD-10-CM | POA: Diagnosis not present

## 2022-04-24 DIAGNOSIS — S42242D 4-part fracture of surgical neck of left humerus, subsequent encounter for fracture with routine healing: Secondary | ICD-10-CM | POA: Diagnosis not present

## 2022-04-25 DIAGNOSIS — M6281 Muscle weakness (generalized): Secondary | ICD-10-CM | POA: Diagnosis not present

## 2022-04-25 DIAGNOSIS — N183 Chronic kidney disease, stage 3 unspecified: Secondary | ICD-10-CM | POA: Diagnosis not present

## 2022-04-25 DIAGNOSIS — R4189 Other symptoms and signs involving cognitive functions and awareness: Secondary | ICD-10-CM | POA: Diagnosis not present

## 2022-04-25 DIAGNOSIS — R278 Other lack of coordination: Secondary | ICD-10-CM | POA: Diagnosis not present

## 2022-04-25 DIAGNOSIS — E785 Hyperlipidemia, unspecified: Secondary | ICD-10-CM | POA: Diagnosis not present

## 2022-04-25 DIAGNOSIS — Z741 Need for assistance with personal care: Secondary | ICD-10-CM | POA: Diagnosis not present

## 2022-04-25 DIAGNOSIS — S42242D 4-part fracture of surgical neck of left humerus, subsequent encounter for fracture with routine healing: Secondary | ICD-10-CM | POA: Diagnosis not present

## 2022-04-25 DIAGNOSIS — Z96612 Presence of left artificial shoulder joint: Secondary | ICD-10-CM | POA: Diagnosis not present

## 2022-04-25 DIAGNOSIS — M1990 Unspecified osteoarthritis, unspecified site: Secondary | ICD-10-CM | POA: Diagnosis not present

## 2022-04-29 DIAGNOSIS — E785 Hyperlipidemia, unspecified: Secondary | ICD-10-CM | POA: Diagnosis not present

## 2022-04-29 DIAGNOSIS — R4189 Other symptoms and signs involving cognitive functions and awareness: Secondary | ICD-10-CM | POA: Diagnosis not present

## 2022-04-29 DIAGNOSIS — N183 Chronic kidney disease, stage 3 unspecified: Secondary | ICD-10-CM | POA: Diagnosis not present

## 2022-04-29 DIAGNOSIS — Z741 Need for assistance with personal care: Secondary | ICD-10-CM | POA: Diagnosis not present

## 2022-04-29 DIAGNOSIS — Z96612 Presence of left artificial shoulder joint: Secondary | ICD-10-CM | POA: Diagnosis not present

## 2022-04-29 DIAGNOSIS — M6281 Muscle weakness (generalized): Secondary | ICD-10-CM | POA: Diagnosis not present

## 2022-04-29 DIAGNOSIS — M1990 Unspecified osteoarthritis, unspecified site: Secondary | ICD-10-CM | POA: Diagnosis not present

## 2022-04-29 DIAGNOSIS — R278 Other lack of coordination: Secondary | ICD-10-CM | POA: Diagnosis not present

## 2022-04-29 DIAGNOSIS — S42242D 4-part fracture of surgical neck of left humerus, subsequent encounter for fracture with routine healing: Secondary | ICD-10-CM | POA: Diagnosis not present

## 2022-05-01 ENCOUNTER — Telehealth: Payer: Self-pay | Admitting: Family Medicine

## 2022-05-01 DIAGNOSIS — M6281 Muscle weakness (generalized): Secondary | ICD-10-CM | POA: Diagnosis not present

## 2022-05-01 DIAGNOSIS — R4189 Other symptoms and signs involving cognitive functions and awareness: Secondary | ICD-10-CM | POA: Diagnosis not present

## 2022-05-01 DIAGNOSIS — E785 Hyperlipidemia, unspecified: Secondary | ICD-10-CM | POA: Diagnosis not present

## 2022-05-01 DIAGNOSIS — N183 Chronic kidney disease, stage 3 unspecified: Secondary | ICD-10-CM | POA: Diagnosis not present

## 2022-05-01 DIAGNOSIS — R278 Other lack of coordination: Secondary | ICD-10-CM | POA: Diagnosis not present

## 2022-05-01 DIAGNOSIS — M1990 Unspecified osteoarthritis, unspecified site: Secondary | ICD-10-CM | POA: Diagnosis not present

## 2022-05-01 DIAGNOSIS — Z741 Need for assistance with personal care: Secondary | ICD-10-CM | POA: Diagnosis not present

## 2022-05-01 DIAGNOSIS — Z96612 Presence of left artificial shoulder joint: Secondary | ICD-10-CM | POA: Diagnosis not present

## 2022-05-01 DIAGNOSIS — S42242D 4-part fracture of surgical neck of left humerus, subsequent encounter for fracture with routine healing: Secondary | ICD-10-CM | POA: Diagnosis not present

## 2022-05-01 NOTE — Telephone Encounter (Signed)
Becca from [facility] wants to know if they did bone density test and if it was completed fax it over to [phone]. Call back number [phone].

## 2022-05-02 DIAGNOSIS — R278 Other lack of coordination: Secondary | ICD-10-CM | POA: Diagnosis not present

## 2022-05-02 DIAGNOSIS — S42242D 4-part fracture of surgical neck of left humerus, subsequent encounter for fracture with routine healing: Secondary | ICD-10-CM | POA: Diagnosis not present

## 2022-05-02 DIAGNOSIS — Z96612 Presence of left artificial shoulder joint: Secondary | ICD-10-CM | POA: Diagnosis not present

## 2022-05-02 DIAGNOSIS — R4189 Other symptoms and signs involving cognitive functions and awareness: Secondary | ICD-10-CM | POA: Diagnosis not present

## 2022-05-02 DIAGNOSIS — M1990 Unspecified osteoarthritis, unspecified site: Secondary | ICD-10-CM | POA: Diagnosis not present

## 2022-05-02 DIAGNOSIS — M6281 Muscle weakness (generalized): Secondary | ICD-10-CM | POA: Diagnosis not present

## 2022-05-02 DIAGNOSIS — N183 Chronic kidney disease, stage 3 unspecified: Secondary | ICD-10-CM | POA: Diagnosis not present

## 2022-05-02 DIAGNOSIS — E785 Hyperlipidemia, unspecified: Secondary | ICD-10-CM | POA: Diagnosis not present

## 2022-05-02 DIAGNOSIS — Z741 Need for assistance with personal care: Secondary | ICD-10-CM | POA: Diagnosis not present

## 2022-05-03 NOTE — Telephone Encounter (Signed)
DEXA scan has been faxed to [facility] [place] at [phone].

## 2022-05-06 DIAGNOSIS — E785 Hyperlipidemia, unspecified: Secondary | ICD-10-CM | POA: Diagnosis not present

## 2022-05-06 DIAGNOSIS — Z741 Need for assistance with personal care: Secondary | ICD-10-CM | POA: Diagnosis not present

## 2022-05-06 DIAGNOSIS — M6281 Muscle weakness (generalized): Secondary | ICD-10-CM | POA: Diagnosis not present

## 2022-05-06 DIAGNOSIS — R4189 Other symptoms and signs involving cognitive functions and awareness: Secondary | ICD-10-CM | POA: Diagnosis not present

## 2022-05-06 DIAGNOSIS — Z96612 Presence of left artificial shoulder joint: Secondary | ICD-10-CM | POA: Diagnosis not present

## 2022-05-06 DIAGNOSIS — N183 Chronic kidney disease, stage 3 unspecified: Secondary | ICD-10-CM | POA: Diagnosis not present

## 2022-05-06 DIAGNOSIS — S42242D 4-part fracture of surgical neck of left humerus, subsequent encounter for fracture with routine healing: Secondary | ICD-10-CM | POA: Diagnosis not present

## 2022-05-06 DIAGNOSIS — M1990 Unspecified osteoarthritis, unspecified site: Secondary | ICD-10-CM | POA: Diagnosis not present

## 2022-05-06 DIAGNOSIS — R278 Other lack of coordination: Secondary | ICD-10-CM | POA: Diagnosis not present

## 2022-05-08 DIAGNOSIS — M1990 Unspecified osteoarthritis, unspecified site: Secondary | ICD-10-CM | POA: Diagnosis not present

## 2022-05-08 DIAGNOSIS — Z96612 Presence of left artificial shoulder joint: Secondary | ICD-10-CM | POA: Diagnosis not present

## 2022-05-08 DIAGNOSIS — R4189 Other symptoms and signs involving cognitive functions and awareness: Secondary | ICD-10-CM | POA: Diagnosis not present

## 2022-05-08 DIAGNOSIS — M6281 Muscle weakness (generalized): Secondary | ICD-10-CM | POA: Diagnosis not present

## 2022-05-08 DIAGNOSIS — Z741 Need for assistance with personal care: Secondary | ICD-10-CM | POA: Diagnosis not present

## 2022-05-08 DIAGNOSIS — R278 Other lack of coordination: Secondary | ICD-10-CM | POA: Diagnosis not present

## 2022-05-08 DIAGNOSIS — N183 Chronic kidney disease, stage 3 unspecified: Secondary | ICD-10-CM | POA: Diagnosis not present

## 2022-05-08 DIAGNOSIS — E785 Hyperlipidemia, unspecified: Secondary | ICD-10-CM | POA: Diagnosis not present

## 2022-05-08 DIAGNOSIS — S42242D 4-part fracture of surgical neck of left humerus, subsequent encounter for fracture with routine healing: Secondary | ICD-10-CM | POA: Diagnosis not present

## 2022-05-10 ENCOUNTER — Other Ambulatory Visit: Payer: Self-pay | Admitting: Family Medicine

## 2022-05-10 MED ORDER — MOLNUPIRAVIR EUA 200MG CAPSULE: 4.0000 | ORAL_CAPSULE | Freq: Two times a day (BID) | ORAL | 0 refills | Status: AC

## 2022-05-22 DIAGNOSIS — Z96612 Presence of left artificial shoulder joint: Secondary | ICD-10-CM | POA: Diagnosis not present

## 2022-05-22 DIAGNOSIS — R4189 Other symptoms and signs involving cognitive functions and awareness: Secondary | ICD-10-CM | POA: Diagnosis not present

## 2022-05-22 DIAGNOSIS — N183 Chronic kidney disease, stage 3 unspecified: Secondary | ICD-10-CM | POA: Diagnosis not present

## 2022-05-22 DIAGNOSIS — R278 Other lack of coordination: Secondary | ICD-10-CM | POA: Diagnosis not present

## 2022-05-22 DIAGNOSIS — M1990 Unspecified osteoarthritis, unspecified site: Secondary | ICD-10-CM | POA: Diagnosis not present

## 2022-05-22 DIAGNOSIS — S42242D 4-part fracture of surgical neck of left humerus, subsequent encounter for fracture with routine healing: Secondary | ICD-10-CM | POA: Diagnosis not present

## 2022-05-22 DIAGNOSIS — M6281 Muscle weakness (generalized): Secondary | ICD-10-CM | POA: Diagnosis not present

## 2022-05-22 DIAGNOSIS — E785 Hyperlipidemia, unspecified: Secondary | ICD-10-CM | POA: Diagnosis not present

## 2022-05-22 DIAGNOSIS — Z741 Need for assistance with personal care: Secondary | ICD-10-CM | POA: Diagnosis not present

## 2022-05-24 DIAGNOSIS — Z96612 Presence of left artificial shoulder joint: Secondary | ICD-10-CM | POA: Diagnosis not present

## 2022-05-24 DIAGNOSIS — Z741 Need for assistance with personal care: Secondary | ICD-10-CM | POA: Diagnosis not present

## 2022-05-24 DIAGNOSIS — E785 Hyperlipidemia, unspecified: Secondary | ICD-10-CM | POA: Diagnosis not present

## 2022-05-24 DIAGNOSIS — R278 Other lack of coordination: Secondary | ICD-10-CM | POA: Diagnosis not present

## 2022-05-24 DIAGNOSIS — S42242D 4-part fracture of surgical neck of left humerus, subsequent encounter for fracture with routine healing: Secondary | ICD-10-CM | POA: Diagnosis not present

## 2022-05-24 DIAGNOSIS — N183 Chronic kidney disease, stage 3 unspecified: Secondary | ICD-10-CM | POA: Diagnosis not present

## 2022-05-24 DIAGNOSIS — R4189 Other symptoms and signs involving cognitive functions and awareness: Secondary | ICD-10-CM | POA: Diagnosis not present

## 2022-05-24 DIAGNOSIS — M6281 Muscle weakness (generalized): Secondary | ICD-10-CM | POA: Diagnosis not present

## 2022-05-24 DIAGNOSIS — M1990 Unspecified osteoarthritis, unspecified site: Secondary | ICD-10-CM | POA: Diagnosis not present

## 2022-05-31 DIAGNOSIS — N183 Chronic kidney disease, stage 3 unspecified: Secondary | ICD-10-CM | POA: Diagnosis not present

## 2022-05-31 DIAGNOSIS — M6281 Muscle weakness (generalized): Secondary | ICD-10-CM | POA: Diagnosis not present

## 2022-05-31 DIAGNOSIS — Z96612 Presence of left artificial shoulder joint: Secondary | ICD-10-CM | POA: Diagnosis not present

## 2022-05-31 DIAGNOSIS — E785 Hyperlipidemia, unspecified: Secondary | ICD-10-CM | POA: Diagnosis not present

## 2022-05-31 DIAGNOSIS — R278 Other lack of coordination: Secondary | ICD-10-CM | POA: Diagnosis not present

## 2022-05-31 DIAGNOSIS — R4189 Other symptoms and signs involving cognitive functions and awareness: Secondary | ICD-10-CM | POA: Diagnosis not present

## 2022-05-31 DIAGNOSIS — S42242D 4-part fracture of surgical neck of left humerus, subsequent encounter for fracture with routine healing: Secondary | ICD-10-CM | POA: Diagnosis not present

## 2022-05-31 DIAGNOSIS — Z741 Need for assistance with personal care: Secondary | ICD-10-CM | POA: Diagnosis not present

## 2022-05-31 DIAGNOSIS — M1990 Unspecified osteoarthritis, unspecified site: Secondary | ICD-10-CM | POA: Diagnosis not present

## 2022-06-03 DIAGNOSIS — E785 Hyperlipidemia, unspecified: Secondary | ICD-10-CM | POA: Diagnosis not present

## 2022-06-03 DIAGNOSIS — S42242D 4-part fracture of surgical neck of left humerus, subsequent encounter for fracture with routine healing: Secondary | ICD-10-CM | POA: Diagnosis not present

## 2022-06-03 DIAGNOSIS — R278 Other lack of coordination: Secondary | ICD-10-CM | POA: Diagnosis not present

## 2022-06-03 DIAGNOSIS — M6281 Muscle weakness (generalized): Secondary | ICD-10-CM | POA: Diagnosis not present

## 2022-06-03 DIAGNOSIS — Z96612 Presence of left artificial shoulder joint: Secondary | ICD-10-CM | POA: Diagnosis not present

## 2022-06-03 DIAGNOSIS — R4189 Other symptoms and signs involving cognitive functions and awareness: Secondary | ICD-10-CM | POA: Diagnosis not present

## 2022-06-03 DIAGNOSIS — M1990 Unspecified osteoarthritis, unspecified site: Secondary | ICD-10-CM | POA: Diagnosis not present

## 2022-06-03 DIAGNOSIS — N183 Chronic kidney disease, stage 3 unspecified: Secondary | ICD-10-CM | POA: Diagnosis not present

## 2022-06-03 DIAGNOSIS — Z741 Need for assistance with personal care: Secondary | ICD-10-CM | POA: Diagnosis not present

## 2022-06-05 DIAGNOSIS — M6281 Muscle weakness (generalized): Secondary | ICD-10-CM | POA: Diagnosis not present

## 2022-06-05 DIAGNOSIS — M1990 Unspecified osteoarthritis, unspecified site: Secondary | ICD-10-CM | POA: Diagnosis not present

## 2022-06-05 DIAGNOSIS — Z96612 Presence of left artificial shoulder joint: Secondary | ICD-10-CM | POA: Diagnosis not present

## 2022-06-05 DIAGNOSIS — R278 Other lack of coordination: Secondary | ICD-10-CM | POA: Diagnosis not present

## 2022-06-05 DIAGNOSIS — S42242D 4-part fracture of surgical neck of left humerus, subsequent encounter for fracture with routine healing: Secondary | ICD-10-CM | POA: Diagnosis not present

## 2022-06-05 DIAGNOSIS — R4189 Other symptoms and signs involving cognitive functions and awareness: Secondary | ICD-10-CM | POA: Diagnosis not present

## 2022-06-05 DIAGNOSIS — N183 Chronic kidney disease, stage 3 unspecified: Secondary | ICD-10-CM | POA: Diagnosis not present

## 2022-06-05 DIAGNOSIS — E785 Hyperlipidemia, unspecified: Secondary | ICD-10-CM | POA: Diagnosis not present

## 2022-06-05 DIAGNOSIS — Z741 Need for assistance with personal care: Secondary | ICD-10-CM | POA: Diagnosis not present

## 2022-06-10 DIAGNOSIS — S42242D 4-part fracture of surgical neck of left humerus, subsequent encounter for fracture with routine healing: Secondary | ICD-10-CM | POA: Diagnosis not present

## 2022-06-10 DIAGNOSIS — Z96612 Presence of left artificial shoulder joint: Secondary | ICD-10-CM | POA: Diagnosis not present

## 2022-06-10 DIAGNOSIS — R278 Other lack of coordination: Secondary | ICD-10-CM | POA: Diagnosis not present

## 2022-06-10 DIAGNOSIS — Z741 Need for assistance with personal care: Secondary | ICD-10-CM | POA: Diagnosis not present

## 2022-06-10 DIAGNOSIS — N183 Chronic kidney disease, stage 3 unspecified: Secondary | ICD-10-CM | POA: Diagnosis not present

## 2022-06-10 DIAGNOSIS — M1990 Unspecified osteoarthritis, unspecified site: Secondary | ICD-10-CM | POA: Diagnosis not present

## 2022-06-10 DIAGNOSIS — E785 Hyperlipidemia, unspecified: Secondary | ICD-10-CM | POA: Diagnosis not present

## 2022-06-10 DIAGNOSIS — M6281 Muscle weakness (generalized): Secondary | ICD-10-CM | POA: Diagnosis not present

## 2022-06-10 DIAGNOSIS — R4189 Other symptoms and signs involving cognitive functions and awareness: Secondary | ICD-10-CM | POA: Diagnosis not present

## 2022-06-12 DIAGNOSIS — E785 Hyperlipidemia, unspecified: Secondary | ICD-10-CM | POA: Diagnosis not present

## 2022-06-12 DIAGNOSIS — N183 Chronic kidney disease, stage 3 unspecified: Secondary | ICD-10-CM | POA: Diagnosis not present

## 2022-06-12 DIAGNOSIS — R4189 Other symptoms and signs involving cognitive functions and awareness: Secondary | ICD-10-CM | POA: Diagnosis not present

## 2022-06-12 DIAGNOSIS — M1990 Unspecified osteoarthritis, unspecified site: Secondary | ICD-10-CM | POA: Diagnosis not present

## 2022-06-12 DIAGNOSIS — S42242D 4-part fracture of surgical neck of left humerus, subsequent encounter for fracture with routine healing: Secondary | ICD-10-CM | POA: Diagnosis not present

## 2022-06-12 DIAGNOSIS — Z96612 Presence of left artificial shoulder joint: Secondary | ICD-10-CM | POA: Diagnosis not present

## 2022-06-12 DIAGNOSIS — R278 Other lack of coordination: Secondary | ICD-10-CM | POA: Diagnosis not present

## 2022-06-12 DIAGNOSIS — M6281 Muscle weakness (generalized): Secondary | ICD-10-CM | POA: Diagnosis not present

## 2022-06-12 DIAGNOSIS — Z741 Need for assistance with personal care: Secondary | ICD-10-CM | POA: Diagnosis not present

## 2022-06-14 DIAGNOSIS — R4189 Other symptoms and signs involving cognitive functions and awareness: Secondary | ICD-10-CM | POA: Diagnosis not present

## 2022-06-14 DIAGNOSIS — N183 Chronic kidney disease, stage 3 unspecified: Secondary | ICD-10-CM | POA: Diagnosis not present

## 2022-06-14 DIAGNOSIS — R278 Other lack of coordination: Secondary | ICD-10-CM | POA: Diagnosis not present

## 2022-06-14 DIAGNOSIS — Z741 Need for assistance with personal care: Secondary | ICD-10-CM | POA: Diagnosis not present

## 2022-06-14 DIAGNOSIS — E785 Hyperlipidemia, unspecified: Secondary | ICD-10-CM | POA: Diagnosis not present

## 2022-06-14 DIAGNOSIS — M1990 Unspecified osteoarthritis, unspecified site: Secondary | ICD-10-CM | POA: Diagnosis not present

## 2022-06-14 DIAGNOSIS — M6281 Muscle weakness (generalized): Secondary | ICD-10-CM | POA: Diagnosis not present

## 2022-06-14 DIAGNOSIS — Z96612 Presence of left artificial shoulder joint: Secondary | ICD-10-CM | POA: Diagnosis not present

## 2022-06-14 DIAGNOSIS — S42242D 4-part fracture of surgical neck of left humerus, subsequent encounter for fracture with routine healing: Secondary | ICD-10-CM | POA: Diagnosis not present

## 2022-06-17 DIAGNOSIS — R278 Other lack of coordination: Secondary | ICD-10-CM | POA: Diagnosis not present

## 2022-06-17 DIAGNOSIS — E785 Hyperlipidemia, unspecified: Secondary | ICD-10-CM | POA: Diagnosis not present

## 2022-06-17 DIAGNOSIS — Z96612 Presence of left artificial shoulder joint: Secondary | ICD-10-CM | POA: Diagnosis not present

## 2022-06-17 DIAGNOSIS — M6281 Muscle weakness (generalized): Secondary | ICD-10-CM | POA: Diagnosis not present

## 2022-06-17 DIAGNOSIS — N183 Chronic kidney disease, stage 3 unspecified: Secondary | ICD-10-CM | POA: Diagnosis not present

## 2022-06-17 DIAGNOSIS — M1990 Unspecified osteoarthritis, unspecified site: Secondary | ICD-10-CM | POA: Diagnosis not present

## 2022-06-17 DIAGNOSIS — Z741 Need for assistance with personal care: Secondary | ICD-10-CM | POA: Diagnosis not present

## 2022-06-17 DIAGNOSIS — S42242D 4-part fracture of surgical neck of left humerus, subsequent encounter for fracture with routine healing: Secondary | ICD-10-CM | POA: Diagnosis not present

## 2022-06-17 DIAGNOSIS — R4189 Other symptoms and signs involving cognitive functions and awareness: Secondary | ICD-10-CM | POA: Diagnosis not present

## 2022-06-19 DIAGNOSIS — R278 Other lack of coordination: Secondary | ICD-10-CM | POA: Diagnosis not present

## 2022-06-19 DIAGNOSIS — S42242D 4-part fracture of surgical neck of left humerus, subsequent encounter for fracture with routine healing: Secondary | ICD-10-CM | POA: Diagnosis not present

## 2022-06-19 DIAGNOSIS — R4189 Other symptoms and signs involving cognitive functions and awareness: Secondary | ICD-10-CM | POA: Diagnosis not present

## 2022-06-19 DIAGNOSIS — Z741 Need for assistance with personal care: Secondary | ICD-10-CM | POA: Diagnosis not present

## 2022-06-19 DIAGNOSIS — M1990 Unspecified osteoarthritis, unspecified site: Secondary | ICD-10-CM | POA: Diagnosis not present

## 2022-06-19 DIAGNOSIS — N183 Chronic kidney disease, stage 3 unspecified: Secondary | ICD-10-CM | POA: Diagnosis not present

## 2022-06-19 DIAGNOSIS — E785 Hyperlipidemia, unspecified: Secondary | ICD-10-CM | POA: Diagnosis not present

## 2022-06-19 DIAGNOSIS — Z96612 Presence of left artificial shoulder joint: Secondary | ICD-10-CM | POA: Diagnosis not present

## 2022-06-19 DIAGNOSIS — M6281 Muscle weakness (generalized): Secondary | ICD-10-CM | POA: Diagnosis not present

## 2022-06-21 DIAGNOSIS — M1990 Unspecified osteoarthritis, unspecified site: Secondary | ICD-10-CM | POA: Diagnosis not present

## 2022-06-21 DIAGNOSIS — R4189 Other symptoms and signs involving cognitive functions and awareness: Secondary | ICD-10-CM | POA: Diagnosis not present

## 2022-06-21 DIAGNOSIS — R278 Other lack of coordination: Secondary | ICD-10-CM | POA: Diagnosis not present

## 2022-06-21 DIAGNOSIS — N183 Chronic kidney disease, stage 3 unspecified: Secondary | ICD-10-CM | POA: Diagnosis not present

## 2022-06-21 DIAGNOSIS — M6281 Muscle weakness (generalized): Secondary | ICD-10-CM | POA: Diagnosis not present

## 2022-06-21 DIAGNOSIS — Z741 Need for assistance with personal care: Secondary | ICD-10-CM | POA: Diagnosis not present

## 2022-06-21 DIAGNOSIS — E785 Hyperlipidemia, unspecified: Secondary | ICD-10-CM | POA: Diagnosis not present

## 2022-06-21 DIAGNOSIS — Z96612 Presence of left artificial shoulder joint: Secondary | ICD-10-CM | POA: Diagnosis not present

## 2022-06-21 DIAGNOSIS — S42242D 4-part fracture of surgical neck of left humerus, subsequent encounter for fracture with routine healing: Secondary | ICD-10-CM | POA: Diagnosis not present

## 2022-06-24 DIAGNOSIS — N183 Chronic kidney disease, stage 3 unspecified: Secondary | ICD-10-CM | POA: Diagnosis not present

## 2022-06-24 DIAGNOSIS — Z741 Need for assistance with personal care: Secondary | ICD-10-CM | POA: Diagnosis not present

## 2022-06-24 DIAGNOSIS — E785 Hyperlipidemia, unspecified: Secondary | ICD-10-CM | POA: Diagnosis not present

## 2022-06-24 DIAGNOSIS — Z96612 Presence of left artificial shoulder joint: Secondary | ICD-10-CM | POA: Diagnosis not present

## 2022-06-24 DIAGNOSIS — M6281 Muscle weakness (generalized): Secondary | ICD-10-CM | POA: Diagnosis not present

## 2022-06-24 DIAGNOSIS — R278 Other lack of coordination: Secondary | ICD-10-CM | POA: Diagnosis not present

## 2022-06-24 DIAGNOSIS — S42242D 4-part fracture of surgical neck of left humerus, subsequent encounter for fracture with routine healing: Secondary | ICD-10-CM | POA: Diagnosis not present

## 2022-06-24 DIAGNOSIS — M1990 Unspecified osteoarthritis, unspecified site: Secondary | ICD-10-CM | POA: Diagnosis not present

## 2022-06-24 DIAGNOSIS — R4189 Other symptoms and signs involving cognitive functions and awareness: Secondary | ICD-10-CM | POA: Diagnosis not present

## 2022-06-26 DIAGNOSIS — Z96612 Presence of left artificial shoulder joint: Secondary | ICD-10-CM | POA: Diagnosis not present

## 2022-06-26 DIAGNOSIS — E785 Hyperlipidemia, unspecified: Secondary | ICD-10-CM | POA: Diagnosis not present

## 2022-06-26 DIAGNOSIS — R4189 Other symptoms and signs involving cognitive functions and awareness: Secondary | ICD-10-CM | POA: Diagnosis not present

## 2022-06-26 DIAGNOSIS — R278 Other lack of coordination: Secondary | ICD-10-CM | POA: Diagnosis not present

## 2022-06-26 DIAGNOSIS — S42242D 4-part fracture of surgical neck of left humerus, subsequent encounter for fracture with routine healing: Secondary | ICD-10-CM | POA: Diagnosis not present

## 2022-06-26 DIAGNOSIS — Z741 Need for assistance with personal care: Secondary | ICD-10-CM | POA: Diagnosis not present

## 2022-06-26 DIAGNOSIS — M6281 Muscle weakness (generalized): Secondary | ICD-10-CM | POA: Diagnosis not present

## 2022-06-26 DIAGNOSIS — M1990 Unspecified osteoarthritis, unspecified site: Secondary | ICD-10-CM | POA: Diagnosis not present

## 2022-06-26 DIAGNOSIS — N183 Chronic kidney disease, stage 3 unspecified: Secondary | ICD-10-CM | POA: Diagnosis not present

## 2022-06-28 DIAGNOSIS — N183 Chronic kidney disease, stage 3 unspecified: Secondary | ICD-10-CM | POA: Diagnosis not present

## 2022-06-28 DIAGNOSIS — Z96612 Presence of left artificial shoulder joint: Secondary | ICD-10-CM | POA: Diagnosis not present

## 2022-06-28 DIAGNOSIS — R278 Other lack of coordination: Secondary | ICD-10-CM | POA: Diagnosis not present

## 2022-06-28 DIAGNOSIS — M6281 Muscle weakness (generalized): Secondary | ICD-10-CM | POA: Diagnosis not present

## 2022-06-28 DIAGNOSIS — M1990 Unspecified osteoarthritis, unspecified site: Secondary | ICD-10-CM | POA: Diagnosis not present

## 2022-06-28 DIAGNOSIS — Z741 Need for assistance with personal care: Secondary | ICD-10-CM | POA: Diagnosis not present

## 2022-06-28 DIAGNOSIS — E785 Hyperlipidemia, unspecified: Secondary | ICD-10-CM | POA: Diagnosis not present

## 2022-06-28 DIAGNOSIS — R4189 Other symptoms and signs involving cognitive functions and awareness: Secondary | ICD-10-CM | POA: Diagnosis not present

## 2022-06-28 DIAGNOSIS — S42242D 4-part fracture of surgical neck of left humerus, subsequent encounter for fracture with routine healing: Secondary | ICD-10-CM | POA: Diagnosis not present

## 2022-07-01 DIAGNOSIS — M1990 Unspecified osteoarthritis, unspecified site: Secondary | ICD-10-CM | POA: Diagnosis not present

## 2022-07-01 DIAGNOSIS — Z96612 Presence of left artificial shoulder joint: Secondary | ICD-10-CM | POA: Diagnosis not present

## 2022-07-01 DIAGNOSIS — N183 Chronic kidney disease, stage 3 unspecified: Secondary | ICD-10-CM | POA: Diagnosis not present

## 2022-07-01 DIAGNOSIS — R278 Other lack of coordination: Secondary | ICD-10-CM | POA: Diagnosis not present

## 2022-07-01 DIAGNOSIS — S42242D 4-part fracture of surgical neck of left humerus, subsequent encounter for fracture with routine healing: Secondary | ICD-10-CM | POA: Diagnosis not present

## 2022-07-01 DIAGNOSIS — Z741 Need for assistance with personal care: Secondary | ICD-10-CM | POA: Diagnosis not present

## 2022-07-01 DIAGNOSIS — E785 Hyperlipidemia, unspecified: Secondary | ICD-10-CM | POA: Diagnosis not present

## 2022-07-01 DIAGNOSIS — M6281 Muscle weakness (generalized): Secondary | ICD-10-CM | POA: Diagnosis not present

## 2022-07-01 DIAGNOSIS — R4189 Other symptoms and signs involving cognitive functions and awareness: Secondary | ICD-10-CM | POA: Diagnosis not present

## 2022-07-03 DIAGNOSIS — S42242D 4-part fracture of surgical neck of left humerus, subsequent encounter for fracture with routine healing: Secondary | ICD-10-CM | POA: Diagnosis not present

## 2022-07-03 DIAGNOSIS — N183 Chronic kidney disease, stage 3 unspecified: Secondary | ICD-10-CM | POA: Diagnosis not present

## 2022-07-03 DIAGNOSIS — Z96612 Presence of left artificial shoulder joint: Secondary | ICD-10-CM | POA: Diagnosis not present

## 2022-07-03 DIAGNOSIS — M1990 Unspecified osteoarthritis, unspecified site: Secondary | ICD-10-CM | POA: Diagnosis not present

## 2022-07-03 DIAGNOSIS — R4189 Other symptoms and signs involving cognitive functions and awareness: Secondary | ICD-10-CM | POA: Diagnosis not present

## 2022-07-03 DIAGNOSIS — R278 Other lack of coordination: Secondary | ICD-10-CM | POA: Diagnosis not present

## 2022-07-03 DIAGNOSIS — Z741 Need for assistance with personal care: Secondary | ICD-10-CM | POA: Diagnosis not present

## 2022-07-03 DIAGNOSIS — M6281 Muscle weakness (generalized): Secondary | ICD-10-CM | POA: Diagnosis not present

## 2022-07-03 DIAGNOSIS — E785 Hyperlipidemia, unspecified: Secondary | ICD-10-CM | POA: Diagnosis not present

## 2022-07-08 DIAGNOSIS — R4189 Other symptoms and signs involving cognitive functions and awareness: Secondary | ICD-10-CM | POA: Diagnosis not present

## 2022-07-08 DIAGNOSIS — Z741 Need for assistance with personal care: Secondary | ICD-10-CM | POA: Diagnosis not present

## 2022-07-08 DIAGNOSIS — N183 Chronic kidney disease, stage 3 unspecified: Secondary | ICD-10-CM | POA: Diagnosis not present

## 2022-07-08 DIAGNOSIS — Z96612 Presence of left artificial shoulder joint: Secondary | ICD-10-CM | POA: Diagnosis not present

## 2022-07-08 DIAGNOSIS — M6281 Muscle weakness (generalized): Secondary | ICD-10-CM | POA: Diagnosis not present

## 2022-07-08 DIAGNOSIS — M1990 Unspecified osteoarthritis, unspecified site: Secondary | ICD-10-CM | POA: Diagnosis not present

## 2022-07-08 DIAGNOSIS — E785 Hyperlipidemia, unspecified: Secondary | ICD-10-CM | POA: Diagnosis not present

## 2022-07-08 DIAGNOSIS — R278 Other lack of coordination: Secondary | ICD-10-CM | POA: Diagnosis not present

## 2022-07-08 DIAGNOSIS — S42242D 4-part fracture of surgical neck of left humerus, subsequent encounter for fracture with routine healing: Secondary | ICD-10-CM | POA: Diagnosis not present

## 2022-07-10 DIAGNOSIS — M6281 Muscle weakness (generalized): Secondary | ICD-10-CM | POA: Diagnosis not present

## 2022-07-10 DIAGNOSIS — M1990 Unspecified osteoarthritis, unspecified site: Secondary | ICD-10-CM | POA: Diagnosis not present

## 2022-07-10 DIAGNOSIS — E785 Hyperlipidemia, unspecified: Secondary | ICD-10-CM | POA: Diagnosis not present

## 2022-07-10 DIAGNOSIS — N183 Chronic kidney disease, stage 3 unspecified: Secondary | ICD-10-CM | POA: Diagnosis not present

## 2022-07-10 DIAGNOSIS — R4189 Other symptoms and signs involving cognitive functions and awareness: Secondary | ICD-10-CM | POA: Diagnosis not present

## 2022-07-10 DIAGNOSIS — R278 Other lack of coordination: Secondary | ICD-10-CM | POA: Diagnosis not present

## 2022-07-10 DIAGNOSIS — S42242D 4-part fracture of surgical neck of left humerus, subsequent encounter for fracture with routine healing: Secondary | ICD-10-CM | POA: Diagnosis not present

## 2022-07-10 DIAGNOSIS — Z741 Need for assistance with personal care: Secondary | ICD-10-CM | POA: Diagnosis not present

## 2022-07-10 DIAGNOSIS — Z96612 Presence of left artificial shoulder joint: Secondary | ICD-10-CM | POA: Diagnosis not present

## 2022-07-12 DIAGNOSIS — R278 Other lack of coordination: Secondary | ICD-10-CM | POA: Diagnosis not present

## 2022-07-12 DIAGNOSIS — S42242D 4-part fracture of surgical neck of left humerus, subsequent encounter for fracture with routine healing: Secondary | ICD-10-CM | POA: Diagnosis not present

## 2022-07-12 DIAGNOSIS — M6281 Muscle weakness (generalized): Secondary | ICD-10-CM | POA: Diagnosis not present

## 2022-07-12 DIAGNOSIS — E785 Hyperlipidemia, unspecified: Secondary | ICD-10-CM | POA: Diagnosis not present

## 2022-07-12 DIAGNOSIS — R4189 Other symptoms and signs involving cognitive functions and awareness: Secondary | ICD-10-CM | POA: Diagnosis not present

## 2022-07-12 DIAGNOSIS — N183 Chronic kidney disease, stage 3 unspecified: Secondary | ICD-10-CM | POA: Diagnosis not present

## 2022-07-12 DIAGNOSIS — Z741 Need for assistance with personal care: Secondary | ICD-10-CM | POA: Diagnosis not present

## 2022-07-12 DIAGNOSIS — Z96612 Presence of left artificial shoulder joint: Secondary | ICD-10-CM | POA: Diagnosis not present

## 2022-07-12 DIAGNOSIS — M1990 Unspecified osteoarthritis, unspecified site: Secondary | ICD-10-CM | POA: Diagnosis not present

## 2022-07-15 DIAGNOSIS — S42242D 4-part fracture of surgical neck of left humerus, subsequent encounter for fracture with routine healing: Secondary | ICD-10-CM | POA: Diagnosis not present

## 2022-07-15 DIAGNOSIS — M6281 Muscle weakness (generalized): Secondary | ICD-10-CM | POA: Diagnosis not present

## 2022-07-15 DIAGNOSIS — R4189 Other symptoms and signs involving cognitive functions and awareness: Secondary | ICD-10-CM | POA: Diagnosis not present

## 2022-07-15 DIAGNOSIS — M1990 Unspecified osteoarthritis, unspecified site: Secondary | ICD-10-CM | POA: Diagnosis not present

## 2022-07-15 DIAGNOSIS — Z96612 Presence of left artificial shoulder joint: Secondary | ICD-10-CM | POA: Diagnosis not present

## 2022-07-15 DIAGNOSIS — E785 Hyperlipidemia, unspecified: Secondary | ICD-10-CM | POA: Diagnosis not present

## 2022-07-15 DIAGNOSIS — R278 Other lack of coordination: Secondary | ICD-10-CM | POA: Diagnosis not present

## 2022-07-15 DIAGNOSIS — Z741 Need for assistance with personal care: Secondary | ICD-10-CM | POA: Diagnosis not present

## 2022-07-15 DIAGNOSIS — N183 Chronic kidney disease, stage 3 unspecified: Secondary | ICD-10-CM | POA: Diagnosis not present

## 2022-07-17 DIAGNOSIS — S42242D 4-part fracture of surgical neck of left humerus, subsequent encounter for fracture with routine healing: Secondary | ICD-10-CM | POA: Diagnosis not present

## 2022-07-17 DIAGNOSIS — M1990 Unspecified osteoarthritis, unspecified site: Secondary | ICD-10-CM | POA: Diagnosis not present

## 2022-07-17 DIAGNOSIS — R278 Other lack of coordination: Secondary | ICD-10-CM | POA: Diagnosis not present

## 2022-07-17 DIAGNOSIS — R4189 Other symptoms and signs involving cognitive functions and awareness: Secondary | ICD-10-CM | POA: Diagnosis not present

## 2022-07-17 DIAGNOSIS — Z741 Need for assistance with personal care: Secondary | ICD-10-CM | POA: Diagnosis not present

## 2022-07-17 DIAGNOSIS — M6281 Muscle weakness (generalized): Secondary | ICD-10-CM | POA: Diagnosis not present

## 2022-07-17 DIAGNOSIS — E785 Hyperlipidemia, unspecified: Secondary | ICD-10-CM | POA: Diagnosis not present

## 2022-07-17 DIAGNOSIS — N183 Chronic kidney disease, stage 3 unspecified: Secondary | ICD-10-CM | POA: Diagnosis not present

## 2022-07-17 DIAGNOSIS — Z96612 Presence of left artificial shoulder joint: Secondary | ICD-10-CM | POA: Diagnosis not present

## 2022-07-19 DIAGNOSIS — R278 Other lack of coordination: Secondary | ICD-10-CM | POA: Diagnosis not present

## 2022-07-19 DIAGNOSIS — R4189 Other symptoms and signs involving cognitive functions and awareness: Secondary | ICD-10-CM | POA: Diagnosis not present

## 2022-07-19 DIAGNOSIS — Z96612 Presence of left artificial shoulder joint: Secondary | ICD-10-CM | POA: Diagnosis not present

## 2022-07-19 DIAGNOSIS — S42242D 4-part fracture of surgical neck of left humerus, subsequent encounter for fracture with routine healing: Secondary | ICD-10-CM | POA: Diagnosis not present

## 2022-07-19 DIAGNOSIS — M6281 Muscle weakness (generalized): Secondary | ICD-10-CM | POA: Diagnosis not present

## 2022-07-19 DIAGNOSIS — N183 Chronic kidney disease, stage 3 unspecified: Secondary | ICD-10-CM | POA: Diagnosis not present

## 2022-07-19 DIAGNOSIS — M1990 Unspecified osteoarthritis, unspecified site: Secondary | ICD-10-CM | POA: Diagnosis not present

## 2022-07-19 DIAGNOSIS — Z741 Need for assistance with personal care: Secondary | ICD-10-CM | POA: Diagnosis not present

## 2022-07-19 DIAGNOSIS — E785 Hyperlipidemia, unspecified: Secondary | ICD-10-CM | POA: Diagnosis not present

## 2022-07-22 DIAGNOSIS — N183 Chronic kidney disease, stage 3 unspecified: Secondary | ICD-10-CM | POA: Diagnosis not present

## 2022-07-22 DIAGNOSIS — Z741 Need for assistance with personal care: Secondary | ICD-10-CM | POA: Diagnosis not present

## 2022-07-22 DIAGNOSIS — Z96612 Presence of left artificial shoulder joint: Secondary | ICD-10-CM | POA: Diagnosis not present

## 2022-07-22 DIAGNOSIS — M1990 Unspecified osteoarthritis, unspecified site: Secondary | ICD-10-CM | POA: Diagnosis not present

## 2022-07-22 DIAGNOSIS — M6281 Muscle weakness (generalized): Secondary | ICD-10-CM | POA: Diagnosis not present

## 2022-07-22 DIAGNOSIS — S42242D 4-part fracture of surgical neck of left humerus, subsequent encounter for fracture with routine healing: Secondary | ICD-10-CM | POA: Diagnosis not present

## 2022-07-22 DIAGNOSIS — R4189 Other symptoms and signs involving cognitive functions and awareness: Secondary | ICD-10-CM | POA: Diagnosis not present

## 2022-07-22 DIAGNOSIS — E785 Hyperlipidemia, unspecified: Secondary | ICD-10-CM | POA: Diagnosis not present

## 2022-07-22 DIAGNOSIS — R278 Other lack of coordination: Secondary | ICD-10-CM | POA: Diagnosis not present

## 2022-07-24 DIAGNOSIS — R4189 Other symptoms and signs involving cognitive functions and awareness: Secondary | ICD-10-CM | POA: Diagnosis not present

## 2022-07-24 DIAGNOSIS — N183 Chronic kidney disease, stage 3 unspecified: Secondary | ICD-10-CM | POA: Diagnosis not present

## 2022-07-24 DIAGNOSIS — S42242D 4-part fracture of surgical neck of left humerus, subsequent encounter for fracture with routine healing: Secondary | ICD-10-CM | POA: Diagnosis not present

## 2022-07-24 DIAGNOSIS — R278 Other lack of coordination: Secondary | ICD-10-CM | POA: Diagnosis not present

## 2022-07-24 DIAGNOSIS — Z741 Need for assistance with personal care: Secondary | ICD-10-CM | POA: Diagnosis not present

## 2022-07-24 DIAGNOSIS — Z96612 Presence of left artificial shoulder joint: Secondary | ICD-10-CM | POA: Diagnosis not present

## 2022-07-24 DIAGNOSIS — M6281 Muscle weakness (generalized): Secondary | ICD-10-CM | POA: Diagnosis not present

## 2022-07-24 DIAGNOSIS — M1990 Unspecified osteoarthritis, unspecified site: Secondary | ICD-10-CM | POA: Diagnosis not present

## 2022-07-24 DIAGNOSIS — E785 Hyperlipidemia, unspecified: Secondary | ICD-10-CM | POA: Diagnosis not present

## 2022-07-26 DIAGNOSIS — N183 Chronic kidney disease, stage 3 unspecified: Secondary | ICD-10-CM | POA: Diagnosis not present

## 2022-07-26 DIAGNOSIS — E785 Hyperlipidemia, unspecified: Secondary | ICD-10-CM | POA: Diagnosis not present

## 2022-07-26 DIAGNOSIS — R278 Other lack of coordination: Secondary | ICD-10-CM | POA: Diagnosis not present

## 2022-07-26 DIAGNOSIS — R4189 Other symptoms and signs involving cognitive functions and awareness: Secondary | ICD-10-CM | POA: Diagnosis not present

## 2022-07-26 DIAGNOSIS — M1990 Unspecified osteoarthritis, unspecified site: Secondary | ICD-10-CM | POA: Diagnosis not present

## 2022-07-26 DIAGNOSIS — M6281 Muscle weakness (generalized): Secondary | ICD-10-CM | POA: Diagnosis not present

## 2022-07-26 DIAGNOSIS — Z741 Need for assistance with personal care: Secondary | ICD-10-CM | POA: Diagnosis not present

## 2022-07-26 DIAGNOSIS — S42242D 4-part fracture of surgical neck of left humerus, subsequent encounter for fracture with routine healing: Secondary | ICD-10-CM | POA: Diagnosis not present

## 2022-07-26 DIAGNOSIS — Z96612 Presence of left artificial shoulder joint: Secondary | ICD-10-CM | POA: Diagnosis not present

## 2022-07-29 DIAGNOSIS — S42242D 4-part fracture of surgical neck of left humerus, subsequent encounter for fracture with routine healing: Secondary | ICD-10-CM | POA: Diagnosis not present

## 2022-07-29 DIAGNOSIS — R4189 Other symptoms and signs involving cognitive functions and awareness: Secondary | ICD-10-CM | POA: Diagnosis not present

## 2022-07-29 DIAGNOSIS — Z96612 Presence of left artificial shoulder joint: Secondary | ICD-10-CM | POA: Diagnosis not present

## 2022-07-29 DIAGNOSIS — E785 Hyperlipidemia, unspecified: Secondary | ICD-10-CM | POA: Diagnosis not present

## 2022-07-29 DIAGNOSIS — M6281 Muscle weakness (generalized): Secondary | ICD-10-CM | POA: Diagnosis not present

## 2022-07-29 DIAGNOSIS — Z741 Need for assistance with personal care: Secondary | ICD-10-CM | POA: Diagnosis not present

## 2022-07-29 DIAGNOSIS — N183 Chronic kidney disease, stage 3 unspecified: Secondary | ICD-10-CM | POA: Diagnosis not present

## 2022-07-29 DIAGNOSIS — R278 Other lack of coordination: Secondary | ICD-10-CM | POA: Diagnosis not present

## 2022-07-29 DIAGNOSIS — M1990 Unspecified osteoarthritis, unspecified site: Secondary | ICD-10-CM | POA: Diagnosis not present

## 2022-07-31 DIAGNOSIS — M6281 Muscle weakness (generalized): Secondary | ICD-10-CM | POA: Diagnosis not present

## 2022-07-31 DIAGNOSIS — R4189 Other symptoms and signs involving cognitive functions and awareness: Secondary | ICD-10-CM | POA: Diagnosis not present

## 2022-07-31 DIAGNOSIS — N183 Chronic kidney disease, stage 3 unspecified: Secondary | ICD-10-CM | POA: Diagnosis not present

## 2022-07-31 DIAGNOSIS — Z741 Need for assistance with personal care: Secondary | ICD-10-CM | POA: Diagnosis not present

## 2022-07-31 DIAGNOSIS — Z96612 Presence of left artificial shoulder joint: Secondary | ICD-10-CM | POA: Diagnosis not present

## 2022-07-31 DIAGNOSIS — S42242D 4-part fracture of surgical neck of left humerus, subsequent encounter for fracture with routine healing: Secondary | ICD-10-CM | POA: Diagnosis not present

## 2022-07-31 DIAGNOSIS — R278 Other lack of coordination: Secondary | ICD-10-CM | POA: Diagnosis not present

## 2022-07-31 DIAGNOSIS — E785 Hyperlipidemia, unspecified: Secondary | ICD-10-CM | POA: Diagnosis not present

## 2022-07-31 DIAGNOSIS — M1990 Unspecified osteoarthritis, unspecified site: Secondary | ICD-10-CM | POA: Diagnosis not present

## 2022-08-05 DIAGNOSIS — N183 Chronic kidney disease, stage 3 unspecified: Secondary | ICD-10-CM | POA: Diagnosis not present

## 2022-08-05 DIAGNOSIS — E785 Hyperlipidemia, unspecified: Secondary | ICD-10-CM | POA: Diagnosis not present

## 2022-08-05 DIAGNOSIS — Z96612 Presence of left artificial shoulder joint: Secondary | ICD-10-CM | POA: Diagnosis not present

## 2022-08-05 DIAGNOSIS — S42242D 4-part fracture of surgical neck of left humerus, subsequent encounter for fracture with routine healing: Secondary | ICD-10-CM | POA: Diagnosis not present

## 2022-08-05 DIAGNOSIS — R278 Other lack of coordination: Secondary | ICD-10-CM | POA: Diagnosis not present

## 2022-08-05 DIAGNOSIS — Z741 Need for assistance with personal care: Secondary | ICD-10-CM | POA: Diagnosis not present

## 2022-08-05 DIAGNOSIS — M6281 Muscle weakness (generalized): Secondary | ICD-10-CM | POA: Diagnosis not present

## 2022-08-05 DIAGNOSIS — M1990 Unspecified osteoarthritis, unspecified site: Secondary | ICD-10-CM | POA: Diagnosis not present

## 2022-08-05 DIAGNOSIS — R4189 Other symptoms and signs involving cognitive functions and awareness: Secondary | ICD-10-CM | POA: Diagnosis not present

## 2022-08-07 DIAGNOSIS — Z741 Need for assistance with personal care: Secondary | ICD-10-CM | POA: Diagnosis not present

## 2022-08-07 DIAGNOSIS — M6281 Muscle weakness (generalized): Secondary | ICD-10-CM | POA: Diagnosis not present

## 2022-08-07 DIAGNOSIS — R4189 Other symptoms and signs involving cognitive functions and awareness: Secondary | ICD-10-CM | POA: Diagnosis not present

## 2022-08-07 DIAGNOSIS — N183 Chronic kidney disease, stage 3 unspecified: Secondary | ICD-10-CM | POA: Diagnosis not present

## 2022-08-07 DIAGNOSIS — M1990 Unspecified osteoarthritis, unspecified site: Secondary | ICD-10-CM | POA: Diagnosis not present

## 2022-08-07 DIAGNOSIS — S42242D 4-part fracture of surgical neck of left humerus, subsequent encounter for fracture with routine healing: Secondary | ICD-10-CM | POA: Diagnosis not present

## 2022-08-07 DIAGNOSIS — E785 Hyperlipidemia, unspecified: Secondary | ICD-10-CM | POA: Diagnosis not present

## 2022-08-07 DIAGNOSIS — Z96612 Presence of left artificial shoulder joint: Secondary | ICD-10-CM | POA: Diagnosis not present

## 2022-08-07 DIAGNOSIS — R278 Other lack of coordination: Secondary | ICD-10-CM | POA: Diagnosis not present

## 2022-08-12 DIAGNOSIS — M1990 Unspecified osteoarthritis, unspecified site: Secondary | ICD-10-CM | POA: Diagnosis not present

## 2022-08-12 DIAGNOSIS — M6281 Muscle weakness (generalized): Secondary | ICD-10-CM | POA: Diagnosis not present

## 2022-08-12 DIAGNOSIS — R278 Other lack of coordination: Secondary | ICD-10-CM | POA: Diagnosis not present

## 2022-08-12 DIAGNOSIS — E785 Hyperlipidemia, unspecified: Secondary | ICD-10-CM | POA: Diagnosis not present

## 2022-08-12 DIAGNOSIS — N183 Chronic kidney disease, stage 3 unspecified: Secondary | ICD-10-CM | POA: Diagnosis not present

## 2022-08-12 DIAGNOSIS — S42242D 4-part fracture of surgical neck of left humerus, subsequent encounter for fracture with routine healing: Secondary | ICD-10-CM | POA: Diagnosis not present

## 2022-08-12 DIAGNOSIS — Z741 Need for assistance with personal care: Secondary | ICD-10-CM | POA: Diagnosis not present

## 2022-08-12 DIAGNOSIS — N1831 Chronic kidney disease, stage 3a: Secondary | ICD-10-CM | POA: Diagnosis not present

## 2022-08-12 DIAGNOSIS — Z96612 Presence of left artificial shoulder joint: Secondary | ICD-10-CM | POA: Diagnosis not present

## 2022-08-12 DIAGNOSIS — R4189 Other symptoms and signs involving cognitive functions and awareness: Secondary | ICD-10-CM | POA: Diagnosis not present

## 2022-08-14 DIAGNOSIS — S42242D 4-part fracture of surgical neck of left humerus, subsequent encounter for fracture with routine healing: Secondary | ICD-10-CM | POA: Diagnosis not present

## 2022-08-14 DIAGNOSIS — R4189 Other symptoms and signs involving cognitive functions and awareness: Secondary | ICD-10-CM | POA: Diagnosis not present

## 2022-08-14 DIAGNOSIS — Z741 Need for assistance with personal care: Secondary | ICD-10-CM | POA: Diagnosis not present

## 2022-08-14 DIAGNOSIS — Z96612 Presence of left artificial shoulder joint: Secondary | ICD-10-CM | POA: Diagnosis not present

## 2022-08-14 DIAGNOSIS — N183 Chronic kidney disease, stage 3 unspecified: Secondary | ICD-10-CM | POA: Diagnosis not present

## 2022-08-14 DIAGNOSIS — M6281 Muscle weakness (generalized): Secondary | ICD-10-CM | POA: Diagnosis not present

## 2022-08-14 DIAGNOSIS — E785 Hyperlipidemia, unspecified: Secondary | ICD-10-CM | POA: Diagnosis not present

## 2022-08-14 DIAGNOSIS — M1990 Unspecified osteoarthritis, unspecified site: Secondary | ICD-10-CM | POA: Diagnosis not present

## 2022-08-14 DIAGNOSIS — R278 Other lack of coordination: Secondary | ICD-10-CM | POA: Diagnosis not present

## 2022-08-16 DIAGNOSIS — R4189 Other symptoms and signs involving cognitive functions and awareness: Secondary | ICD-10-CM | POA: Diagnosis not present

## 2022-08-16 DIAGNOSIS — R278 Other lack of coordination: Secondary | ICD-10-CM | POA: Diagnosis not present

## 2022-08-16 DIAGNOSIS — Z741 Need for assistance with personal care: Secondary | ICD-10-CM | POA: Diagnosis not present

## 2022-08-16 DIAGNOSIS — S42242D 4-part fracture of surgical neck of left humerus, subsequent encounter for fracture with routine healing: Secondary | ICD-10-CM | POA: Diagnosis not present

## 2022-08-16 DIAGNOSIS — M6281 Muscle weakness (generalized): Secondary | ICD-10-CM | POA: Diagnosis not present

## 2022-08-16 DIAGNOSIS — M1990 Unspecified osteoarthritis, unspecified site: Secondary | ICD-10-CM | POA: Diagnosis not present

## 2022-08-16 DIAGNOSIS — N183 Chronic kidney disease, stage 3 unspecified: Secondary | ICD-10-CM | POA: Diagnosis not present

## 2022-08-16 DIAGNOSIS — Z96612 Presence of left artificial shoulder joint: Secondary | ICD-10-CM | POA: Diagnosis not present

## 2022-08-16 DIAGNOSIS — E785 Hyperlipidemia, unspecified: Secondary | ICD-10-CM | POA: Diagnosis not present

## 2022-08-19 DIAGNOSIS — R278 Other lack of coordination: Secondary | ICD-10-CM | POA: Diagnosis not present

## 2022-08-19 DIAGNOSIS — Z741 Need for assistance with personal care: Secondary | ICD-10-CM | POA: Diagnosis not present

## 2022-08-19 DIAGNOSIS — M1990 Unspecified osteoarthritis, unspecified site: Secondary | ICD-10-CM | POA: Diagnosis not present

## 2022-08-19 DIAGNOSIS — E785 Hyperlipidemia, unspecified: Secondary | ICD-10-CM | POA: Diagnosis not present

## 2022-08-19 DIAGNOSIS — N183 Chronic kidney disease, stage 3 unspecified: Secondary | ICD-10-CM | POA: Diagnosis not present

## 2022-08-19 DIAGNOSIS — M6281 Muscle weakness (generalized): Secondary | ICD-10-CM | POA: Diagnosis not present

## 2022-08-19 DIAGNOSIS — Z96612 Presence of left artificial shoulder joint: Secondary | ICD-10-CM | POA: Diagnosis not present

## 2022-08-19 DIAGNOSIS — S42242D 4-part fracture of surgical neck of left humerus, subsequent encounter for fracture with routine healing: Secondary | ICD-10-CM | POA: Diagnosis not present

## 2022-08-19 DIAGNOSIS — R4189 Other symptoms and signs involving cognitive functions and awareness: Secondary | ICD-10-CM | POA: Diagnosis not present

## 2022-08-21 DIAGNOSIS — Z741 Need for assistance with personal care: Secondary | ICD-10-CM | POA: Diagnosis not present

## 2022-08-21 DIAGNOSIS — E785 Hyperlipidemia, unspecified: Secondary | ICD-10-CM | POA: Diagnosis not present

## 2022-08-21 DIAGNOSIS — M1990 Unspecified osteoarthritis, unspecified site: Secondary | ICD-10-CM | POA: Diagnosis not present

## 2022-08-21 DIAGNOSIS — N183 Chronic kidney disease, stage 3 unspecified: Secondary | ICD-10-CM | POA: Diagnosis not present

## 2022-08-21 DIAGNOSIS — S42242D 4-part fracture of surgical neck of left humerus, subsequent encounter for fracture with routine healing: Secondary | ICD-10-CM | POA: Diagnosis not present

## 2022-08-21 DIAGNOSIS — R4189 Other symptoms and signs involving cognitive functions and awareness: Secondary | ICD-10-CM | POA: Diagnosis not present

## 2022-08-21 DIAGNOSIS — M6281 Muscle weakness (generalized): Secondary | ICD-10-CM | POA: Diagnosis not present

## 2022-08-21 DIAGNOSIS — R278 Other lack of coordination: Secondary | ICD-10-CM | POA: Diagnosis not present

## 2022-08-21 DIAGNOSIS — Z96612 Presence of left artificial shoulder joint: Secondary | ICD-10-CM | POA: Diagnosis not present

## 2022-08-23 DIAGNOSIS — N1831 Chronic kidney disease, stage 3a: Secondary | ICD-10-CM | POA: Diagnosis not present

## 2022-08-23 DIAGNOSIS — D631 Anemia in chronic kidney disease: Secondary | ICD-10-CM | POA: Diagnosis not present

## 2022-08-23 DIAGNOSIS — I10 Essential (primary) hypertension: Secondary | ICD-10-CM | POA: Diagnosis not present

## 2022-08-23 DIAGNOSIS — M1990 Unspecified osteoarthritis, unspecified site: Secondary | ICD-10-CM | POA: Diagnosis not present

## 2022-08-23 DIAGNOSIS — N2581 Secondary hyperparathyroidism of renal origin: Secondary | ICD-10-CM | POA: Diagnosis not present

## 2022-08-23 DIAGNOSIS — S42242D 4-part fracture of surgical neck of left humerus, subsequent encounter for fracture with routine healing: Secondary | ICD-10-CM | POA: Diagnosis not present

## 2022-08-23 DIAGNOSIS — R4189 Other symptoms and signs involving cognitive functions and awareness: Secondary | ICD-10-CM | POA: Diagnosis not present

## 2022-08-23 DIAGNOSIS — E785 Hyperlipidemia, unspecified: Secondary | ICD-10-CM | POA: Diagnosis not present

## 2022-08-23 DIAGNOSIS — N183 Chronic kidney disease, stage 3 unspecified: Secondary | ICD-10-CM | POA: Diagnosis not present

## 2022-08-23 DIAGNOSIS — R809 Proteinuria, unspecified: Secondary | ICD-10-CM | POA: Diagnosis not present

## 2022-08-23 DIAGNOSIS — R278 Other lack of coordination: Secondary | ICD-10-CM | POA: Diagnosis not present

## 2022-08-23 DIAGNOSIS — M6281 Muscle weakness (generalized): Secondary | ICD-10-CM | POA: Diagnosis not present

## 2022-08-23 DIAGNOSIS — Z96612 Presence of left artificial shoulder joint: Secondary | ICD-10-CM | POA: Diagnosis not present

## 2022-08-23 DIAGNOSIS — Z741 Need for assistance with personal care: Secondary | ICD-10-CM | POA: Diagnosis not present

## 2022-08-26 DIAGNOSIS — R278 Other lack of coordination: Secondary | ICD-10-CM | POA: Diagnosis not present

## 2022-08-26 DIAGNOSIS — S42242D 4-part fracture of surgical neck of left humerus, subsequent encounter for fracture with routine healing: Secondary | ICD-10-CM | POA: Diagnosis not present

## 2022-08-26 DIAGNOSIS — M1990 Unspecified osteoarthritis, unspecified site: Secondary | ICD-10-CM | POA: Diagnosis not present

## 2022-08-26 DIAGNOSIS — N183 Chronic kidney disease, stage 3 unspecified: Secondary | ICD-10-CM | POA: Diagnosis not present

## 2022-08-26 DIAGNOSIS — R4189 Other symptoms and signs involving cognitive functions and awareness: Secondary | ICD-10-CM | POA: Diagnosis not present

## 2022-08-26 DIAGNOSIS — Z96612 Presence of left artificial shoulder joint: Secondary | ICD-10-CM | POA: Diagnosis not present

## 2022-08-26 DIAGNOSIS — M6281 Muscle weakness (generalized): Secondary | ICD-10-CM | POA: Diagnosis not present

## 2022-08-26 DIAGNOSIS — Z741 Need for assistance with personal care: Secondary | ICD-10-CM | POA: Diagnosis not present

## 2022-08-26 DIAGNOSIS — E785 Hyperlipidemia, unspecified: Secondary | ICD-10-CM | POA: Diagnosis not present

## 2022-08-28 DIAGNOSIS — N183 Chronic kidney disease, stage 3 unspecified: Secondary | ICD-10-CM | POA: Diagnosis not present

## 2022-08-28 DIAGNOSIS — Z741 Need for assistance with personal care: Secondary | ICD-10-CM | POA: Diagnosis not present

## 2022-08-28 DIAGNOSIS — S42242D 4-part fracture of surgical neck of left humerus, subsequent encounter for fracture with routine healing: Secondary | ICD-10-CM | POA: Diagnosis not present

## 2022-08-28 DIAGNOSIS — R278 Other lack of coordination: Secondary | ICD-10-CM | POA: Diagnosis not present

## 2022-08-28 DIAGNOSIS — M1990 Unspecified osteoarthritis, unspecified site: Secondary | ICD-10-CM | POA: Diagnosis not present

## 2022-08-28 DIAGNOSIS — E785 Hyperlipidemia, unspecified: Secondary | ICD-10-CM | POA: Diagnosis not present

## 2022-08-28 DIAGNOSIS — R4189 Other symptoms and signs involving cognitive functions and awareness: Secondary | ICD-10-CM | POA: Diagnosis not present

## 2022-08-28 DIAGNOSIS — M6281 Muscle weakness (generalized): Secondary | ICD-10-CM | POA: Diagnosis not present

## 2022-08-28 DIAGNOSIS — Z96612 Presence of left artificial shoulder joint: Secondary | ICD-10-CM | POA: Diagnosis not present

## 2022-09-06 ENCOUNTER — Other Ambulatory Visit: Payer: Self-pay | Admitting: Family Medicine

## 2022-09-06 DIAGNOSIS — Z1231 Encounter for screening mammogram for malignant neoplasm of breast: Secondary | ICD-10-CM

## 2022-10-07 ENCOUNTER — Ambulatory Visit
Admission: RE | Admit: 2022-10-07 | Discharge: 2022-10-07 | Disposition: A | Discharge status: Home / Self Care | Payer: Medicare PPO | Source: Ambulatory Visit | Attending: Family Medicine | Admitting: Family Medicine

## 2022-10-07 DIAGNOSIS — Z1231 Encounter for screening mammogram for malignant neoplasm of breast: Secondary | ICD-10-CM | POA: Diagnosis not present

## 2022-10-22 ENCOUNTER — Telehealth: Payer: Self-pay | Admitting: Family Medicine

## 2022-10-22 NOTE — Telephone Encounter (Signed)
Patient will call back to schedule AWV with [facility]  

## 2022-12-05 ENCOUNTER — Ambulatory Visit (INDEPENDENT_AMBULATORY_CARE_PROVIDER_SITE_OTHER): Payer: Medicare PPO

## 2022-12-05 ENCOUNTER — Telehealth: Payer: Self-pay | Admitting: *Deleted

## 2022-12-05 ENCOUNTER — Other Ambulatory Visit: Payer: Self-pay | Admitting: Family Medicine

## 2022-12-05 VITALS — Ht 59.0 in | Wt 147.0 lb

## 2022-12-05 DIAGNOSIS — E782 Mixed hyperlipidemia: Secondary | ICD-10-CM

## 2022-12-05 DIAGNOSIS — Z0000 Encounter for general adult medical examination without abnormal findings: Secondary | ICD-10-CM | POA: Diagnosis not present

## 2022-12-05 DIAGNOSIS — E538 Deficiency of other specified B group vitamins: Secondary | ICD-10-CM

## 2022-12-05 NOTE — Patient Instructions (Signed)
[name] , Thank you for taking time to come for your Medicare Wellness Visit. I appreciate your ongoing commitment to your health goals. Please review the following plan we discussed and let me know if I can assist you in the future.   These are the goals we discussed:  Goals      Patient Stated     06/28/2019, Patient wants to work on losing some weight soon.      Patient Stated     07/12/2020, I will continue to walk daily when weather permits.      Patient Stated     Would like to lose weight      Patient Stated     Lose weight.     physical     Starting 06/25/2018, I will continue to walk 1-2 miles 5 days per week.         This is a list of the screening recommended for you and due dates:  Health Maintenance  Topic Date Due   DTaP/Tdap/Td vaccine (2 - Td or Tdap) 02/11/2021   Flu Shot  04/16/2022   COVID-19 Vaccine (6 - 2023-24 season) 05/17/2022   Mammogram  10/08/2023   Medicare Annual Wellness Visit  12/05/2023   DEXA scan (bone density measurement)  04/24/2027   Pneumonia Vaccine  Completed   Zoster (Shingles) Vaccine  Completed   HPV Vaccine  Aged Out    Advanced directives: Copy is on file in the chart.  Conditions/risks identified: none  Next appointment: Follow up in one year for your annual wellness visit 12/10/23 @ 2:00 telephone visit.   Preventive Care 43 Years and Older, Female Preventive care refers to lifestyle choices and visits with your health care provider that can promote health and wellness. What does preventive care include? A yearly physical exam. This is also called an annual well check. Dental exams once or twice a year. Routine eye exams. Ask your health care provider how often you should have your eyes checked. Personal lifestyle choices, including: Daily care of your teeth and gums. Regular physical activity. Eating a healthy diet. Avoiding tobacco and drug use. Limiting alcohol use. Practicing safe sex. Taking low-dose  aspirin every day. Taking vitamin and mineral supplements as recommended by your health care provider. What happens during an annual well check? The services and screenings done by your health care provider during your annual well check will depend on your age, overall health, lifestyle risk factors, and family history of disease. Counseling  Your health care provider may ask you questions about your: Alcohol use. Tobacco use. Drug use. Emotional well-being. Home and relationship well-being. Sexual activity. Eating habits. History of falls. Memory and ability to understand (cognition). Work and work Statistician. Reproductive health. Screening  You may have the following tests or measurements: Height, weight, and BMI. Blood pressure. Lipid and cholesterol levels. These may be checked every 5 years, or more frequently if you are over 50 years old. Skin check. Lung cancer screening. You may have this screening every year starting at age 40 if you have a 30-pack-year history of smoking and currently smoke or have quit within the past 15 years. Fecal occult blood test (FOBT) of the stool. You may have this test every year starting at age 6. Flexible sigmoidoscopy or colonoscopy. You may have a sigmoidoscopy every 5 years or a colonoscopy every 10 years starting at age 37. Hepatitis C blood test. Hepatitis B blood test. Sexually transmitted disease (STD) testing. Diabetes screening. This is  done by checking your blood sugar (glucose) after you have not eaten for a while (fasting). You may have this done every 1-3 years. Bone density scan. This is done to screen for osteoporosis. You may have this done starting at age 67. Mammogram. This may be done every 1-2 years. Talk to your health care provider about how often you should have regular mammograms. Talk with your health care provider about your test results, treatment options, and if necessary, the need for more tests. Vaccines  Your  health care provider may recommend certain vaccines, such as: Influenza vaccine. This is recommended every year. Tetanus, diphtheria, and acellular pertussis (Tdap, Td) vaccine. You may need a Td booster every 10 years. Zoster vaccine. You may need this after age 71. Pneumococcal 13-valent conjugate (PCV13) vaccine. One dose is recommended after age 39. Pneumococcal polysaccharide (PPSV23) vaccine. One dose is recommended after age 3. Talk to your health care provider about which screenings and vaccines you need and how often you need them. This information is not intended to replace advice given to you by your health care provider. Make sure you discuss any questions you have with your health care provider. Document Released: 09/29/2015 Document Revised: 05/22/2016 Document Reviewed: 07/04/2015 Elsevier Interactive Patient Education  2017 [employer] Prevention in the Home Falls can cause injuries. They can happen to people of all ages. There are many things you can do to make your home safe and to help prevent falls. What can I do on the outside of my home? Regularly fix the edges of walkways and driveways and fix any cracks. Remove anything that might make you trip as you walk through a door, such as a raised step or threshold. Trim any bushes or trees on the path to your home. Use bright outdoor lighting. Clear any walking paths of anything that might make someone trip, such as rocks or tools. Regularly check to see if handrails are loose or broken. Make sure that both sides of any steps have handrails. Any raised decks and porches should have guardrails on the edges. Have any leaves, snow, or ice cleared regularly. Use sand or salt on walking paths during winter. Clean up any spills in your garage right away. This includes oil or grease spills. What can I do in the bathroom? Use night lights. Install grab bars by the toilet and in the tub and shower. Do not use towel bars as  grab bars. Use non-skid mats or decals in the tub or shower. If you need to sit down in the shower, use a plastic, non-slip stool. Keep the floor dry. Clean up any water that spills on the floor as soon as it happens. Remove soap buildup in the tub or shower regularly. Attach bath mats securely with double-sided non-slip rug tape. Do not have throw rugs and other things on the floor that can make you trip. What can I do in the bedroom? Use night lights. Make sure that you have a light by your bed that is easy to reach. Do not use any sheets or blankets that are too big for your bed. They should not hang down onto the floor. Have a firm chair that has side arms. You can use this for support while you get dressed. Do not have throw rugs and other things on the floor that can make you trip. What can I do in the kitchen? Clean up any spills right away. Avoid walking on wet floors. Keep items that you  use a lot in easy-to-reach places. If you need to reach something above you, use a strong step stool that has a grab bar. Keep electrical cords out of the way. Do not use floor polish or wax that makes floors slippery. If you must use wax, use non-skid floor wax. Do not have throw rugs and other things on the floor that can make you trip. What can I do with my stairs? Do not leave any items on the stairs. Make sure that there are handrails on both sides of the stairs and use them. Fix handrails that are broken or loose. Make sure that handrails are as long as the stairways. Check any carpeting to make sure that it is firmly attached to the stairs. Fix any carpet that is loose or worn. Avoid having throw rugs at the top or bottom of the stairs. If you do have throw rugs, attach them to the floor with carpet tape. Make sure that you have a light switch at the top of the stairs and the bottom of the stairs. If you do not have them, ask someone to add them for you. What else can I do to help prevent  falls? Wear shoes that: Do not have high heels. Have rubber bottoms. Are comfortable and fit you well. Are closed at the toe. Do not wear sandals. If you use a stepladder: Make sure that it is fully opened. Do not climb a closed stepladder. Make sure that both sides of the stepladder are locked into place. Ask someone to hold it for you, if possible. Clearly mark and make sure that you can see: Any grab bars or handrails. First and last steps. Where the edge of each step is. Use tools that help you move around (mobility aids) if they are needed. These include: Canes. Walkers. Scooters. Crutches. Turn on the lights when you go into a dark area. Replace any light bulbs as soon as they burn out. Set up your furniture so you have a clear path. Avoid moving your furniture around. If any of your floors are uneven, fix them. If there are any pets around you, be aware of where they are. Review your medicines with your doctor. Some medicines can make you feel dizzy. This can increase your chance of falling. Ask your doctor what other things that you can do to help prevent falls. This information is not intended to replace advice given to you by your health care provider. Make sure you discuss any questions you have with your health care provider. Document Released: 06/29/2009 Document Revised: 02/08/2016 Document Reviewed: 10/07/2014 Elsevier Interactive Patient Education  2017 [employer].

## 2022-12-05 NOTE — Telephone Encounter (Signed)
-----   Message from Ellamae Sia sent at 12/05/2022 10:44 AM EDT ----- Regarding: Lab orders for Friday, 4.5.24 Patient is scheduled for CPX labs, please order future labs, Thanks , Karna Christmas

## 2022-12-05 NOTE — Progress Notes (Signed)
I connected with  Natasha Sawyer on 12/05/22 by a audio enabled telemedicine application and verified that I am speaking with the correct person using two identifiers.  Patient Location: Home  Provider Location: Home Office  I discussed the limitations of evaluation and management by telemedicine. The patient expressed understanding and agreed to proceed.  Subjective:   Natasha Sawyer is a 82 y.o. female who presents for Medicare Annual (Subsequent) preventive examination.  Review of Systems     Cardiac Risk Factors include: advanced age (>24men, >37 women);hypertension     Objective:    Today's Vitals   12/05/22 1457  Weight: 147 lb (66.7 kg)  Height: 4\' 11"  (1.499 m)   Body mass index is 29.69 kg/m.     12/05/2022    3:02 PM 12/20/2021   10:57 AM 12/19/2021    1:48 PM 12/10/2021   12:57 PM 11/08/2021    8:22 AM 07/12/2020   12:28 PM 06/28/2019   11:22 AM  Advanced Directives  Does Patient Have a Medical Advance Directive? Yes Yes Yes No Yes Yes Yes  Type of Paramedic of [place];Living will [place];Living will Living will;Healthcare Power of [place];Living will [place];Living will [place];Living will  Does patient want to make changes to medical advance directive? No - Patient declined  No - Guardian declined  Yes (MAU/Ambulatory/Procedural Areas - Information given)    Copy of [place] in Chart? Yes - validated most recent copy scanned in chart (See row information) No - copy requested No - copy requested  Yes - validated most recent copy scanned in chart (See row information) Yes - validated most recent copy scanned in chart (See row information) Yes - validated most recent copy scanned in chart (See row information)    Current Medications (verified) Outpatient Encounter Medications as of 12/05/2022  Medication Sig   acetaminophen (TYLENOL)  650 MG CR tablet Take 650 mg by mouth every 8 (eight) hours as needed for pain.   amLODipine (NORVASC) 10 MG tablet Take 10 mg by mouth in the morning.   aspirin EC 325 MG EC tablet Take 1 tablet (325 mg total) by mouth daily.   Cholecalciferol (VITAMIN D3) 50 MCG (2000 UT) TABS Take 2,000 Units by mouth in the morning.   Cyanocobalamin (B-12 PO) Take 1,000 mcg by mouth in the morning.   enalapril (VASOTEC) 20 MG tablet Take 20 mg by mouth in the morning.   ezetimibe-simvastatin (VYTORIN) 10-20 MG tablet Take 1 tablet by mouth in the morning.   propranolol (INDERAL) 10 MG tablet Take 10 mg by mouth daily as needed (palpitations.).   zolpidem (AMBIEN) 5 MG tablet Take 2.5 mg by mouth at bedtime as needed for sleep.   oxyCODONE (OXY IR/ROXICODONE) 5 MG immediate release tablet Take 0.5-1 tablets (2.5-5 mg total) by mouth every 4 (four) hours as needed for moderate pain (pain score 4-6). (Patient not taking: Reported on 12/05/2022)   No facility-administered encounter medications on file as of 12/05/2022.    Allergies (verified) Patient has no known allergies.   History: Past Medical History:  Diagnosis Date   Arthritis    Ascending aorta dilation (HCC)    Chronic kidney disease    Dysrhythmia    Hyperlipidemia    Hypertension    Palpitations    Premature beats    Past Surgical History:  Procedure Laterality Date   APPENDECTOMY  BICEPT TENODESIS Left 12/20/2021   Procedure: Left reverse shoulder arthroplasty for proximal humerus fracture, biceps tenodesis;  Surgeon: Leim Fabry, MD;  Location: [facility] ORS;  Service: Orthopedics;  Laterality: Left;   CESAREAN SECTION     x3   EYE SURGERY     cataract and eyelid   HERNIA REPAIR     REVERSE SHOULDER ARTHROPLASTY Left 12/20/2021   Procedure: Left reverse shoulder arthroplasty for proximal humerus fracture, biceps tenodesis;  Surgeon: Leim Fabry, MD;  Location: [facility] ORS;  Service: Orthopedics;  Laterality: Left;   Family History   Problem Relation Age of Onset   Diabetes Mother    Stroke Father    Breast cancer Cousin 53       maternal   Social History   Socioeconomic History   Marital status: Widowed    Spouse name: Not on file   Number of children: 3   Years of education: Not on file   Highest education level: Not on file  Occupational History   Not on file  Tobacco Use   Smoking status: Never   Smokeless tobacco: Never  Vaping Use   Vaping Use: Never used  Substance and Sexual Activity   Alcohol use: No   Drug use: No   Sexual activity: Not Currently  Other Topics Concern   Not on file  Social History Narrative   Lives [facility]    HAs living will, full code   Social Determinants of Health   Financial Resource Strain: Low Risk  (12/05/2022)   Overall Financial Resource Strain (CARDIA)    Difficulty of Paying Living Expenses: Not hard at all  Food Insecurity: No Food Insecurity (12/05/2022)   Hunger Vital Sign    Worried About Running Out of Food in the Last Year: Never true    Ran Out of Food in the Last Year: Never true  Transportation Needs: No Transportation Needs (11/08/2021)   PRAPARE - Hydrologist (Medical): No    Lack of Transportation (Non-Medical): No  Physical Activity: Sufficiently Active (12/05/2022)   Exercise Vital Sign    Days of Exercise per Week: 4 days    Minutes of Exercise per Session: 50 min  Stress: No Stress Concern Present (12/05/2022)   [place]    Feeling of Stress : Not at all  Social Connections: Moderately Isolated (12/05/2022)   Social Connection and Isolation Panel [NHANES]    Frequency of Communication with Friends and Family: More than three times a week    Frequency of Social Gatherings with Friends and Family: More than three times a week    Attends Religious Services: More than 4 times per year    Active Member of [employer] or Organizations: No    Attends  Archivist Meetings: Never    Marital Status: Widowed    Tobacco Counseling Counseling given: Not Answered   Clinical Intake:  Pre-visit preparation completed: Yes  Pain : No/denies pain     Nutritional Risks: None Diabetes: No  How often do you need to have someone help you when you read instructions, pamphlets, or other written materials from your doctor or pharmacy?: 1 - Never  Diabetic? no  Interpreter Needed?: No  Information entered by :: C.Aldred Mase LPN   Activities of Daily Living    12/05/2022    3:04 PM 12/19/2021    1:51 PM  In your present state of health, do you have any  difficulty performing the following activities:  Hearing? 0   Vision? 0   Difficulty concentrating or making decisions? 0   Walking or climbing stairs? 1   Comment left knee issue, avoids stairs   Dressing or bathing? 0   Doing errands, shopping? 0 0  Preparing Food and eating ? N   Using the Toilet? N   In the past six months, have you accidently leaked urine? N   Do you have problems with loss of bowel control? N   Managing your Medications? N   Managing your Finances? N   Housekeeping or managing your Housekeeping? N     Patient Care Team: Jinny Sanders, MD as PCP - General Wellington Hampshire, MD as PCP - Cardiology (Cardiology) Anthonette Legato, MD as Consulting Physician (Internal Medicine) Dannielle Karvonen, RN as [place] any recent [place] you may have received from other than [facility] providers in the past year (date may be approximate).     Assessment:   This is a routine wellness examination for Cinthya.  Hearing/Vision screen Hearing Screening - Comments:: No aids Vision Screening - Comments:: Readers - [facility]  Dietary issues and exercise activities discussed: Current Exercise Habits: Home exercise routine, Type of exercise: walking, Time (Minutes): 50, Frequency (Times/Week): 5, Weekly Exercise  (Minutes/Week): 250, Intensity: Mild, Exercise limited by: None identified   Goals Addressed             This Visit's Progress    Patient Stated       Lose weight.       Depression Screen    12/05/2022    3:02 PM 11/08/2021    8:24 AM 07/12/2020   12:29 PM 07/04/2020   11:55 AM 06/28/2019   11:23 AM 06/25/2018   10:58 AM 06/16/2017    9:02 AM  PHQ 2/9 Scores  PHQ - 2 Score 0 0 0 0 0 0 0  PHQ- 9 Score   0 2 0 0 0    Fall Risk    12/05/2022    3:03 PM 11/08/2021    8:23 AM 07/12/2020   12:29 PM 07/04/2020   11:49 AM 06/28/2019   11:23 AM  Fall Risk   Falls in the past year? 1 0 0 0 0  Comment Tripped      Number falls in past yr: 0 0 0 0   Injury with Fall? 1 0 0 0   Comment Left shoulder - Followed by Orthopedic      Risk for fall due to : No Fall Risks No Fall Risks No Fall Risks  Medication side effect  Follow up Falls evaluation completed;Falls prevention discussed;Education provided Falls prevention discussed Falls evaluation completed;Falls prevention discussed Falls evaluation completed Falls evaluation completed;Falls prevention discussed    FALL RISK PREVENTION PERTAINING TO THE HOME:  Any stairs in or around the home? No  If so, are there any without handrails? No  Home free of loose throw rugs in walkways, pet beds, electrical cords, etc? Yes  Adequate lighting in your home to reduce risk of falls? Yes   ASSISTIVE DEVICES UTILIZED TO PREVENT FALLS:  Life alert? No  Use of a cane, walker or w/c? No  Grab bars in the bathroom? Yes  Shower chair or bench in shower? No  Elevated toilet seat or a handicapped toilet? Yes    Cognitive Function:    07/12/2020   12:30 PM 06/28/2019   11:26 AM 06/25/2018  10:58 AM 06/16/2017    9:02 AM 06/12/2016    9:40 AM  MMSE - Mini Mental State Exam  Orientation to time 5 5 5 5 5   Orientation to Place 5 5 5 5 5   Registration 3 3 3 3 3   Attention/ Calculation 5 5 0 0 0  Recall 3 3 3 3 3   Language- name 2  objects   0 0 0  Language- repeat 1 1 1 1 1   Language- follow 3 step command   3 3 3   Language- read & follow direction   0 0 0  Write a sentence   0 0 0  Copy design   0 0 0  Total score   20 20 20         12/05/2022    3:07 PM  6CIT Screen  What Year? 0 points  What month? 0 points  What time? 0 points  Count back from 20 0 points  Months in reverse 0 points  Repeat phrase 0 points  Total Score 0 points    Immunizations Immunization History  Administered Date(s) Administered   Fluad Quad(high Dose 65+) 06/14/2020   Influenza, High Dose Seasonal PF 07/06/2021   Influenza,inj,Quad PF,6+ Mos 06/25/2018, 07/02/2019   Influenza-Unspecified 06/16/2013   Moderna Sars-Covid-2 Vaccination 10/01/2019, 10/29/2019, 08/01/2020, 02/01/2021   Pfizer Covid-19 Vaccine Bivalent Booster 51yrs & up 06/07/2021   Pneumococcal Conjugate-13 06/25/2018   Pneumococcal Polysaccharide-23 07/02/2019   Tdap 02/12/2011   Zoster Recombinat (Shingrix) 11/26/2019, 01/26/2020   Zoster, Live 02/13/2012    TDAP status: Due, Education has been provided regarding the importance of this vaccine. Advised may receive this vaccine at local pharmacy or Health Dept. Aware to provide a copy of the vaccination record if obtained from local pharmacy or Health Dept. Verbalized acceptance and understanding.  Flu Vaccine status: Up to date [facility]  Pneumococcal vaccine status: Up to date  Covid-19 vaccine status: Completed vaccines  Qualifies for Shingles Vaccine? Yes   Zostavax completed No   Shingrix Completed?: Yes  Screening Tests Health Maintenance  Topic Date Due   DTaP/Tdap/Td (2 - Td or Tdap) 02/11/2021   INFLUENZA VACCINE  04/16/2022   COVID-19 Vaccine (6 - 2023-24 season) 05/17/2022   MAMMOGRAM  10/08/2023   Medicare Annual Wellness (AWV)  12/05/2023   DEXA SCAN  04/24/2027   Pneumonia Vaccine 8+ Years old  Completed   Zoster Vaccines- Shingrix  Completed   HPV VACCINES  Aged Out    Health  Maintenance  Health Maintenance Due  Topic Date Due   DTaP/Tdap/Td (2 - Td or Tdap) 02/11/2021   INFLUENZA VACCINE  04/16/2022   COVID-19 Vaccine (6 - 2023-24 season) 05/17/2022    Colorectal cancer screening: No longer required.   Mammogram status: Completed 10/07/22. Repeat every year  Bone Density status: Completed 04/23/22. Results reflect: Bone density results: OSTEOPENIA. Repeat every 2 years.  Lung Cancer Screening: (Low Dose CT Chest recommended if Age 34-80 years, 30 pack-year currently smoking OR have quit w/in 15years.) does not qualify.   Lung Cancer Screening Referral: no  Additional Screening:  Hepatitis C Screening: does not qualify; Completed no  Vision Screening: Recommended annual ophthalmology exams for early detection of glaucoma and other disorders of the eye. Is the patient up to date with their annual eye exam?  No  Who is the provider or what is the name of the office in which the patient attends annual eye exams? [facility]  If pt is not established with  a provider, would they like to be referred to a provider to establish care? No .   Dental Screening: Recommended annual dental exams for proper oral hygiene  Community Resource Referral / Chronic Care Management: CRR required this visit?  No   CCM required this visit?  No      Plan:     I have personally reviewed and noted the following in the patient's chart:   Medical and social history Use of alcohol, tobacco or illicit drugs  Current medications and supplements including opioid prescriptions. Patient is not currently taking opioid prescriptions. Functional ability and status Nutritional status Physical activity Advanced directives List of other physicians Hospitalizations, surgeries, and ER visits in previous 12 months Vitals Screenings to include cognitive, depression, and falls Referrals and appointments  In addition, I have reviewed and discussed with patient certain preventive  protocols, quality metrics, and best practice recommendations. A written personalized care plan for preventive services as well as general preventive health recommendations were provided to patient.     Lebron Conners, LPN   [id number]   Nurse Notes: none

## 2022-12-20 ENCOUNTER — Other Ambulatory Visit: Payer: Medicare PPO

## 2022-12-23 ENCOUNTER — Other Ambulatory Visit (INDEPENDENT_AMBULATORY_CARE_PROVIDER_SITE_OTHER): Payer: Medicare PPO

## 2022-12-23 DIAGNOSIS — E538 Deficiency of other specified B group vitamins: Secondary | ICD-10-CM

## 2022-12-23 DIAGNOSIS — E782 Mixed hyperlipidemia: Secondary | ICD-10-CM

## 2022-12-23 LAB — COMPREHENSIVE METABOLIC PANEL
ALT: 9 U/L (ref 0–35)
AST: 13 U/L (ref 0–37)
Albumin: 4.1 g/dL (ref 3.5–5.2)
Alkaline Phosphatase: 68 U/L (ref 39–117)
BUN: 21 mg/dL (ref 6–23)
CO2: 25 mEq/L (ref 19–32)
Calcium: 9.7 mg/dL (ref 8.4–10.5)
Chloride: 106 mEq/L (ref 96–112)
Creatinine, Ser: 1.13 mg/dL (ref 0.40–1.20)
GFR: 46.09 mL/min — ABNORMAL LOW (ref >60.00)
Glucose, Bld: 90 mg/dL (ref 70–99)
Potassium: 5.4 mEq/L — ABNORMAL HIGH (ref 3.5–5.1)
Sodium: 139 mEq/L (ref 135–145)
Total Bilirubin: 0.3 mg/dL (ref 0.2–1.2)
Total Protein: 6.7 g/dL (ref 6.0–8.3)

## 2022-12-23 LAB — VITAMIN B12: Vitamin B-12: 1178 pg/mL — ABNORMAL HIGH (ref 211–911)

## 2022-12-23 LAB — LIPID PANEL
Cholesterol: 167 mg/dL (ref 0–200)
HDL: 84.5 mg/dL (ref >39.00)
LDL Cholesterol: 63 mg/dL (ref 0–99)
NonHDL: 82.3
Total CHOL/HDL Ratio: 2
Triglycerides: 95 mg/dL (ref 0.0–149.0)
VLDL: 19 mg/dL (ref 0.0–40.0)

## 2022-12-27 ENCOUNTER — Ambulatory Visit (INDEPENDENT_AMBULATORY_CARE_PROVIDER_SITE_OTHER): Payer: Medicare PPO | Admitting: Family Medicine

## 2022-12-27 ENCOUNTER — Encounter: Payer: Self-pay | Admitting: Family Medicine

## 2022-12-27 VITALS — BP 108/70 | HR 56 | Temp 98.1°F | Ht 59.25 in | Wt 146.4 lb

## 2022-12-27 DIAGNOSIS — N2581 Secondary hyperparathyroidism of renal origin: Secondary | ICD-10-CM | POA: Diagnosis not present

## 2022-12-27 DIAGNOSIS — E538 Deficiency of other specified B group vitamins: Secondary | ICD-10-CM

## 2022-12-27 DIAGNOSIS — N1832 Chronic kidney disease, stage 3b: Secondary | ICD-10-CM

## 2022-12-27 DIAGNOSIS — I10 Essential (primary) hypertension: Secondary | ICD-10-CM | POA: Diagnosis not present

## 2022-12-27 DIAGNOSIS — E875 Hyperkalemia: Secondary | ICD-10-CM

## 2022-12-27 DIAGNOSIS — I471 Supraventricular tachycardia, unspecified: Secondary | ICD-10-CM

## 2022-12-27 DIAGNOSIS — R531 Weakness: Secondary | ICD-10-CM | POA: Diagnosis not present

## 2022-12-27 DIAGNOSIS — M8589 Other specified disorders of bone density and structure, multiple sites: Secondary | ICD-10-CM | POA: Diagnosis not present

## 2022-12-27 DIAGNOSIS — E785 Hyperlipidemia, unspecified: Secondary | ICD-10-CM

## 2022-12-27 DIAGNOSIS — Z0000 Encounter for general adult medical examination without abnormal findings: Secondary | ICD-10-CM

## 2022-12-27 DIAGNOSIS — D649 Anemia, unspecified: Secondary | ICD-10-CM

## 2022-12-27 LAB — IBC + FERRITIN
Ferritin: 7.6 ng/mL — ABNORMAL LOW (ref 10.0–291.0)
Iron: 28 ug/dL — ABNORMAL LOW (ref 42–145)
Saturation Ratios: 6.5 % — ABNORMAL LOW (ref 20.0–50.0)
TIBC: 429.8 ug/dL (ref 250.0–450.0)
Transferrin: 307 mg/dL (ref 212.0–360.0)

## 2022-12-27 LAB — CBC WITH DIFFERENTIAL/PLATELET
Basophils Absolute: 0 10*3/uL (ref 0.0–0.1)
Basophils Relative: 0.4 % (ref 0.0–3.0)
Eosinophils Absolute: 0.1 10*3/uL (ref 0.0–0.7)
Eosinophils Relative: 1.1 % (ref 0.0–5.0)
HCT: 35 % — ABNORMAL LOW (ref 36.0–46.0)
Hemoglobin: 11.2 g/dL — ABNORMAL LOW (ref 12.0–15.0)
Lymphocytes Relative: 22.3 % (ref 12.0–46.0)
Lymphs Abs: 1.6 10*3/uL (ref 0.7–4.0)
MCHC: 32.1 g/dL (ref 30.0–36.0)
MCV: 87.1 fl (ref 78.0–100.0)
Monocytes Absolute: 0.9 10*3/uL (ref 0.1–1.0)
Monocytes Relative: 12.2 % — ABNORMAL HIGH (ref 3.0–12.0)
Neutro Abs: 4.5 10*3/uL (ref 1.4–7.7)
Neutrophils Relative %: 64 % (ref 43.0–77.0)
Platelets: 269 10*3/uL (ref 150.0–400.0)
RBC: 4.02 Mil/uL (ref 3.87–5.11)
RDW: 17 % — ABNORMAL HIGH (ref 11.5–15.5)
WBC: 7 10*3/uL (ref 4.0–10.5)

## 2022-12-27 LAB — TSH: TSH: 2.55 u[IU]/mL (ref 0.35–5.50)

## 2022-12-27 LAB — BASIC METABOLIC PANEL
BUN: 26 mg/dL — ABNORMAL HIGH (ref 6–23)
CO2: 27 mEq/L (ref 19–32)
Calcium: 9.7 mg/dL (ref 8.4–10.5)
Chloride: 105 mEq/L (ref 96–112)
Creatinine, Ser: 1.32 mg/dL — ABNORMAL HIGH (ref 0.40–1.20)
GFR: 38.24 mL/min — ABNORMAL LOW (ref >60.00)
Glucose, Bld: 84 mg/dL (ref 70–99)
Potassium: 4.9 mEq/L (ref 3.5–5.1)
Sodium: 141 mEq/L (ref 135–145)

## 2022-12-27 NOTE — Assessment & Plan Note (Signed)
Chronic, stable  Followed by Dr. Cherylann Ratel.  On ACEI  BP controlled.

## 2022-12-27 NOTE — Patient Instructions (Signed)
Please stop at the lab to have labs drawn.  Call if recurrent episode. Can make appt for daughter to come with you to review labs in 1-2 week if you would like.

## 2022-12-27 NOTE — Progress Notes (Signed)
Patient ID: RHIYA WATCHMAN, female    DOB: 12-29-1940, 82 y.o.   MRN: [id number]  This visit was conducted in person.  BP 108/70   Pulse (!) 56   Temp 98.1 F (36.7 C) (Temporal)   Ht 4' 11.25" (1.505 m)   Wt 146 lb 6 oz (66.4 kg)   SpO2 98%   BMI 29.32 kg/m    CC: Chief Complaint  Patient presents with   Annual Exam    Part 2    Subjective:   HPI: ALEEZA LUSKEY is a 82 y.o. female presenting on 12/27/2022 for Annual Exam (Part 2)  The patient presents for  complete physical and review of chronic health problems. He/She also has the following acute concerns today: spell on trip in [place].   New issue:   Did have episode on a trip where she " gave out" leg weakness. No painful. Had to sit down and rest. Felt if she kept going she could pass out, but not dizziness or pre-syncope.. just extreme leg weakness.  Was well hydrated, sleeping and eating well.   No syncopal spell (no LOC)  No back pain or leg pain  Had to keep resting every few minutes to get back to the ship.  Laid down and slept .Marland Kitchen Rest of trip fine.  No CP, no SOB.   Walks around here with no issues. No issues since.  fatigue.. has felt " puny" for  years.Marland Kitchen less energy than she used to.  The patient saw a LPN or RN for medicare wellness visit. 12/05/22  Prevention and wellness was reviewed in detail. Note reviewed and important notes copied below.  12/27/22  Hypertension:  Well controlled on amlodipine 10 mg daily, inderal 10 mg daily, enalapril 20 mg daily. BP Readings from Last 3 Encounters:  12/27/22 108/70  04/16/22 120/70  01/15/22 110/66  Using medication without problems or lightheadedness:  none Chest pain with exertion: none Edema: intermittent Short of breath:  some SOB with exertion for a while. Average home BPs: Other issues:    CKD stage 3  and secondary hyperparathyroid followed by Dr. Nelda Bucks Creatinine Clearance: 33.1 mL/min (by C-G formula based on SCr of 1.13  mg/dL). On ACEI, BP controlled.   Hyperkalemia on nrecent las.. has decreased potassium intake.  B12 deficiency:At goal on  oral replacement  Elevated Cholesterol:LDL at goal  < 70 on Vytorin 10/20 mg daily Lab Results  Component Value Date   CHOL 167 12/23/2022   HDL 84.50 12/23/2022   LDLCALC 63 12/23/2022   TRIG 95.0 12/23/2022   CHOLHDL 2 12/23/2022  Using medications without problems: Muscle aches:  none Diet compliance: moderate Exercise: walking  daily to every other day.  Other complaints:    PSVT, stable on inderal prn, amlodipine Followed by Eula Listen PA in past.   No CP, no lightheadedness  Relevant past medical, surgical, family and social history reviewed and updated as indicated. Interim medical history since our last visit reviewed. Allergies and medications reviewed and updated. Outpatient Medications Prior to Visit  Medication Sig Dispense Refill   acetaminophen (TYLENOL) 650 MG CR tablet Take 650 mg by mouth every 8 (eight) hours as needed for pain.     amLODipine (NORVASC) 10 MG tablet Take 10 mg by mouth in the morning.     Cholecalciferol (VITAMIN D3) 50 MCG (2000 UT) TABS Take 2,000 Units by mouth in the morning.     Cyanocobalamin (B-12 PO) Take 1,000 mcg by mouth  in the morning.     enalapril (VASOTEC) 20 MG tablet Take 20 mg by mouth in the morning.     ezetimibe-simvastatin (VYTORIN) 10-20 MG tablet Take 1 tablet by mouth in the morning. 90 tablet 3   propranolol (INDERAL) 10 MG tablet Take 10 mg by mouth daily as needed (palpitations.).     zolpidem (AMBIEN) 5 MG tablet Take 2.5 mg by mouth at bedtime as needed for sleep.     aspirin EC 325 MG EC tablet Take 1 tablet (325 mg total) by mouth daily. 45 tablet 0   oxyCODONE (OXY IR/ROXICODONE) 5 MG immediate release tablet Take 0.5-1 tablets (2.5-5 mg total) by mouth every 4 (four) hours as needed for moderate pain (pain score 4-6). (Patient not taking: Reported on 12/05/2022) 30 tablet 0   No  facility-administered medications prior to visit.     Per HPI unless specifically indicated in ROS section below Review of Systems  Constitutional:  Negative for fatigue, fever and unexpected weight change.  HENT:  Negative for congestion, ear pain, sinus pressure, sneezing, sore throat and trouble swallowing.   Eyes:  Negative for pain and itching.  Respiratory:  Negative for cough, shortness of breath and wheezing.   Cardiovascular:  Negative for chest pain, palpitations and leg swelling.  Gastrointestinal:  Negative for abdominal pain, blood in stool, constipation, diarrhea and nausea.  Genitourinary:  Negative for difficulty urinating, dysuria, hematuria, menstrual problem and vaginal discharge.  Skin:  Negative for rash.  Neurological:  Negative for syncope, weakness, light-headedness, numbness and headaches.  Psychiatric/Behavioral:  Negative for confusion and dysphoric mood. The patient is not nervous/anxious.    Objective:  BP 108/70   Pulse (!) 56   Temp 98.1 F (36.7 C) (Temporal)   Ht 4' 11.25" (1.505 m)   Wt 146 lb 6 oz (66.4 kg)   SpO2 98%   BMI 29.32 kg/m   Wt Readings from Last 3 Encounters:  12/27/22 146 lb 6 oz (66.4 kg)  12/05/22 147 lb (66.7 kg)  04/16/22 150 lb 6 oz (68.2 kg)      Physical Exam Vitals and nursing note reviewed.  Constitutional:      General: She is not in acute distress.    Appearance: Normal appearance. She is well-developed. She is obese. She is not ill-appearing or toxic-appearing.  HENT:     Head: Normocephalic.     Right Ear: Hearing, tympanic membrane, ear canal and external ear normal. Tympanic membrane is not erythematous, retracted or bulging.     Left Ear: Hearing, tympanic membrane, ear canal and external ear normal. Tympanic membrane is not erythematous, retracted or bulging.     Nose: Nose normal. No mucosal edema or rhinorrhea.     Right Sinus: No maxillary sinus tenderness or frontal sinus tenderness.     Left Sinus: No  maxillary sinus tenderness or frontal sinus tenderness.     Mouth/Throat:     Pharynx: Uvula midline.  Eyes:     General: Lids are normal. Lids are everted, no foreign bodies appreciated.     Conjunctiva/sclera: Conjunctivae normal.     Pupils: Pupils are equal, round, and reactive to light.  Neck:     Thyroid: No thyroid mass or thyromegaly.     Vascular: No carotid bruit.     Trachea: Trachea normal.  Cardiovascular:     Rate and Rhythm: Normal rate and regular rhythm.     Pulses: Normal pulses.     Heart sounds: Normal heart sounds,  S1 normal and S2 normal. No murmur heard.    No friction rub. No gallop.  Pulmonary:     Effort: Pulmonary effort is normal. No tachypnea or respiratory distress.     Breath sounds: Normal breath sounds. No decreased breath sounds, wheezing, rhonchi or rales.  Abdominal:     General: Bowel sounds are normal. There is no distension or abdominal bruit.     Palpations: Abdomen is soft. There is no fluid wave or mass.     Tenderness: There is no abdominal tenderness. There is no guarding or rebound.     Hernia: No hernia is present.  Musculoskeletal:     Cervical back: Normal range of motion and neck supple.  Lymphadenopathy:     Cervical: No cervical adenopathy.  Skin:    General: Skin is warm and dry.     Findings: No rash.  Neurological:     Mental Status: She is alert and oriented to person, place, and time.     GCS: GCS eye subscore is 4. GCS verbal subscore is 5. GCS motor subscore is 6.     Cranial Nerves: No cranial nerve deficit.     Sensory: No sensory deficit.     Motor: No abnormal muscle tone.     Coordination: Coordination normal.     Gait: Gait normal.     Deep Tendon Reflexes: Reflexes are normal and symmetric.     Comments: Nml cerebellar exam   No papilledema  Psychiatric:        Mood and Affect: Mood is not anxious or depressed.        Speech: Speech normal.        Behavior: Behavior normal. Behavior is cooperative.         Thought Content: Thought content normal.        Cognition and Memory: Memory is not impaired. She does not exhibit impaired recent memory or impaired remote memory.        Judgment: Judgment normal.       Results for orders placed or performed in visit on 12/23/22  Lipid panel  Result Value Ref Range   Cholesterol 167 0 - 200 mg/dL   Triglycerides 16.1 0.0 - 149.0 mg/dL   HDL 09.60 >45.40 mg/dL   VLDL 98.1 0.0 - 19.1 mg/dL   LDL Cholesterol 63 0 - 99 mg/dL   Total CHOL/HDL Ratio 2    NonHDL 82.30   Vitamin B12  Result Value Ref Range   Vitamin B-12 1,178 (H) 211 - 911 pg/mL  Comprehensive metabolic panel  Result Value Ref Range   Sodium 139 135 - 145 mEq/L   Potassium 5.4 No hemolysis seen (H) 3.5 - 5.1 mEq/L   Chloride 106 96 - 112 mEq/L   CO2 25 19 - 32 mEq/L   Glucose, Bld 90 70 - 99 mg/dL   BUN 21 6 - 23 mg/dL   Creatinine, Ser 4.78 0.40 - 1.20 mg/dL   Total Bilirubin 0.3 0.2 - 1.2 mg/dL   Alkaline Phosphatase 68 39 - 117 U/L   AST 13 0 - 37 U/L   ALT 9 0 - 35 U/L   Total Protein 6.7 6.0 - 8.3 g/dL   Albumin 4.1 3.5 - 5.2 g/dL   GFR 29.56 (L) >21.30 mL/min   Calcium 9.7 8.4 - 10.5 mg/dL    This visit occurred during the SARS-CoV-2 public health emergency.  Safety protocols were in place, including screening questions prior to the visit, additional usage of  staff PPE, and extensive cleaning of exam room while observing appropriate contact time as indicated for disinfecting solutions.   COVID 19 screen:  No recent travel or known exposure to COVID19 The patient denies respiratory symptoms of COVID 19 at this time. The importance of social distancing was discussed today.   Assessment and Plan The patient's preventative maintenance and recommended screening tests for an annual wellness exam were reviewed in full today. Brought up to date unless services declined.  Counselled on the importance of diet, exercise, and its role in overall health and mortality. The  patient's FH and SH was reviewed, including their home life, tobacco status, and drug and alcohol status.   Mammo:10/07/22 normal  PAP/DVE:  last pap 2012 nml,  no longer indicated due to age, asymptomatic, no family history of ovarian or uterine cancer: Colon: last done many years ago 2006 : nml cologuard. No further needed at this time. Nonsmoker Vaccines: COVID , flu uptodate,S/P shingrix and postpone tetanus unless injury Bone density - 02/2017 ostopenia,04/2022. Osteopenia T-1.7  Hep C: done   Problem List Items Addressed This Visit     CKD (chronic kidney disease) stage 3, GFR 30-59 ml/min (Chronic)    Chronic, stable  Followed by Dr. Cherylann Ratel.  On ACEI  BP controlled.      Essential hypertension, benign (Chronic)    Stable, chronic.  Continue current medication.   amlodipine 10 mg daily, inderal 10 mg daily, enalapril 20 mg daily.      Hyperlipidemia (Chronic)    Chronic  LDL at goal on Vytorin 10/20 mg daily      Osteopenia    DEXA -1.1 2018 DEX 2023 T-score of -1.7 osteopenia      PSVT (paroxysmal supraventricular tachycardia) (Chronic)    Chronic  Stable on propranolol,  followed by Cardiology. Eula Listen, PA      Secondary hyperparathyroidism of renal origin (Chronic)    Followed by Dr. Cherylann Ratel, nephrology.       Vitamin B 12 deficiency    Chronic  At goal on  ORAL replacement      Other Visit Diagnoses     Routine general medical examination at a health care facility    -  Primary      Addendum:  EKG: normal EKG, normal sinus rhythm, unchanged from previous tracings.  Kerby Nora, MD

## 2022-12-27 NOTE — Assessment & Plan Note (Signed)
Acute on chronic fatigue  Unclear cause of episode.. will eval with EKG and labs  Pt finds it difficult to describe episode. Pt not clearly reporting dizziness or pre-syncope...will  eval for secondary cause.  Pt denies blood loss. Does feel cold in office today.    No clear cause of spell of leg weakness.. nml pulse bilaterally, no associated back pain, but spinal stenosis possible.

## 2022-12-27 NOTE — Assessment & Plan Note (Signed)
Chronic  Stable on propranolol,  followed by Cardiology. Eula Listen, PA

## 2022-12-27 NOTE — Assessment & Plan Note (Signed)
Due for re-eval... has decrease potassium intake.

## 2022-12-27 NOTE — Assessment & Plan Note (Signed)
Chronic ? ?LDL at goal on Vytorin 10/20 mg daily ?

## 2022-12-27 NOTE — Assessment & Plan Note (Signed)
Followed by Dr. Lateef, nephrology. 

## 2022-12-27 NOTE — Assessment & Plan Note (Addendum)
Given recent weakness will re-eval, ( and make sure no iron deficiency) but stable at last check  07/2022 Hg 10.8 with Dr. Cherylann Ratel.. followed every 6 months.  Likely secondary to CKD and anemia of chronic disease.   If iron deficiency will eval  for occult blood loss.

## 2022-12-27 NOTE — Assessment & Plan Note (Signed)
Stable, chronic.  Continue current medication. ? ? ?amlodipine 10 mg daily, inderal 10 mg daily, enalapril 20 mg daily. ?

## 2022-12-27 NOTE — Assessment & Plan Note (Signed)
DEXA -1.1 2018 DEX 2023 T-score of -1.7 osteopenia

## 2022-12-27 NOTE — Assessment & Plan Note (Signed)
Chronic  At goal on  ORAL replacement

## 2022-12-31 ENCOUNTER — Ambulatory Visit: Payer: Medicare PPO | Attending: Physician Assistant

## 2022-12-31 ENCOUNTER — Encounter: Payer: Self-pay | Admitting: Physician Assistant

## 2022-12-31 ENCOUNTER — Ambulatory Visit: Payer: Medicare PPO | Attending: Physician Assistant | Admitting: Physician Assistant

## 2022-12-31 ENCOUNTER — Other Ambulatory Visit: Payer: Medicare PPO

## 2022-12-31 VITALS — BP 128/76 | HR 49 | Ht 59.0 in | Wt 147.6 lb

## 2022-12-31 DIAGNOSIS — E785 Hyperlipidemia, unspecified: Secondary | ICD-10-CM | POA: Diagnosis not present

## 2022-12-31 DIAGNOSIS — N1832 Chronic kidney disease, stage 3b: Secondary | ICD-10-CM | POA: Diagnosis not present

## 2022-12-31 DIAGNOSIS — I471 Supraventricular tachycardia, unspecified: Secondary | ICD-10-CM

## 2022-12-31 DIAGNOSIS — D649 Anemia, unspecified: Secondary | ICD-10-CM | POA: Diagnosis not present

## 2022-12-31 DIAGNOSIS — I10 Essential (primary) hypertension: Secondary | ICD-10-CM | POA: Diagnosis not present

## 2022-12-31 DIAGNOSIS — R001 Bradycardia, unspecified: Secondary | ICD-10-CM

## 2022-12-31 DIAGNOSIS — R55 Syncope and collapse: Secondary | ICD-10-CM

## 2022-12-31 MED ORDER — AMLODIPINE BESYLATE 5 MG PO TABS: 5.0000 mg | ORAL_TABLET | Freq: Every day | ORAL | 3 refills | Status: DC

## 2022-12-31 NOTE — Patient Instructions (Signed)
Medication Instructions:  Your physician has recommended you make the following change in your medication:   DECREASE Amlodipine to 5 mg once daily     *If you need a refill on your cardiac medications before your next appointment, please call your pharmacy*   Lab Work: None  If you have labs (blood work) drawn today and your tests are completely normal, you will receive your results only by: MyChart Message (if you have MyChart) OR A paper copy in the mail If you have any lab test that is abnormal or we need to change your treatment, we will call you to review the results.   Testing/Procedures: Your physician has requested that you have an echocardiogram. Echocardiography is a painless test that uses sound waves to create images of your heart. It provides your doctor with information about the size and shape of your heart and how well your heart's chambers and valves are working. This procedure takes approximately one hour. There are no restrictions for this procedure. Please do NOT wear cologne, perfume, aftershave, or lotions (deodorant is allowed). Please arrive 15 minutes prior to your appointment time.  Your physician has recommended that you wear a Zio monitor.   This monitor is a medical device that records the heart's electrical activity. Doctors most often use these monitors to diagnose arrhythmias. Arrhythmias are problems with the speed or rhythm of the heartbeat. The monitor is a small device applied to your chest. You can wear one while you do your normal daily activities. While wearing this monitor if you have any symptoms to push the button and record what you felt. Once you have worn this monitor for the period of time provider prescribed (Usually 14 days), you will return the monitor device in the postage paid box. Once it is returned they will download the data collected and provide [place] with a report which the provider will then review and we will call you with those  results. Important tips:  Avoid showering during the first 24 hours of wearing the monitor. Avoid excessive sweating to help maximize wear time. Do not submerge the device, no hot tubs, and no swimming pools. Keep any lotions or oils away from the patch. After 24 hours you may shower with the patch on. Take brief showers with your back facing the shower head.  Do not remove patch once it has been placed because that will interrupt data and decrease adhesive wear time. Push the button when you have any symptoms and write down what you were feeling. Once you have completed wearing your monitor, remove and place into box which has postage paid and place in your outgoing mailbox.  If for some reason you have misplaced your box then call our office and we can provide another box and/or mail it off for you.      Follow-Up: At [facility], you and your health needs are our priority.  As part of our continuing mission to provide you with exceptional heart care, we have created designated Provider Care Teams.  These Care Teams include your primary Cardiologist (physician) and Advanced Practice Providers (APPs -  Physician Assistants and Nurse Practitioners) who all work together to provide you with the care you need, when you need it.   Your next appointment:   2 month(s)  Provider:   Lorine Bears, MD or Eula Listen, PA-C

## 2022-12-31 NOTE — Progress Notes (Signed)
Cardiology Office Note    Date:  12/31/2022   ID:  Natasha Sawyer, DOB 19-Feb-1941, MRN [id number]  PCP:  Excell Seltzer, MD  Cardiologist:  Lorine Bears, MD  Electrophysiologist:  None   Chief Complaint: Follow-up  History of Present Illness:   Natasha Sawyer is a 82 y.o. female with history of PSVT, CKD stage IIIb followed by nephrology, HTN, and HLD who presents for follow-up of SVT.  She was evaluated in 2013 for palpitations, atypical chest pain, and SOB with treadmill stress test being normal. Echo showed a normal LVSF with no significant valvular abnormalities. 48-hour Holter monitor showed sinus rhythm with frequent PACs and no significant arrhythmia. She was seen in 2019 with recurrent palpitations. Zio patch showed sinus rhythm with a 4-beat run of WCT, 18 episodes of SVT with the longest lasting 16 beats, and occasional PACs and PVCs. Treadmill stress test in 08/2018 showed no evidence of ischemia. She was able to exercise for 3 minutes and 39 seconds. Echo in 09/2018, showed normal LVSF, mild mitral regurgitation, and normal PASP.  Lexiscan Myoview on 02/21/2020 showed no significant ischemia with an EF greater than 65% and was overall a low risk study.  Echo on 02/22/2020 showed an EF of 55 to 60%, no regional wall motion abnormalities, diastolic dysfunction, normal RV systolic function and normal RV ventricular cavity size, normal PASP, mild mitral regurgitation, and mildly dilated ascending aorta measuring 36 mm.  She was last seen in the office in 04/2022 and was without symptoms of angina or cardiac decompensation.  She has not been maintained on standing beta-blocker secondary to resting asymptomatic bradycardic rates.  She is without symptoms of angina or decompensation.  She reports an episode of lower extremity weakness with generalized fatigue and near syncope while ambulating in [place] on a cruise excursion.  She reports that she had ambulated for a little over half a mile when  her legs felt very weak with associated generalized fatigue or syncope.  She was without symptoms of angina, dyspnea, or palpitations with this episode.  On the way back to the ship, she had to stop and rest multiple times due to generalized fatigue and lower extremity weakness.  Following this episode, she was able to continue on with ambulatory cruise excursions without further symptoms of generalized fatigue, lower extremity weakness, or near syncope.  No symptoms of frank angina or dyspnea.  No palpitations or dizziness.  She was without symptoms of claudication.  She continues to report longstanding generalized fatigue.   Labs independently reviewed: 12/2022 - Hgb 11.2, PLT 269, TSH normal, potassium 4.9, BUN 26, serum creatinine 1.32, albumin 4.1, AST/ALT normal, TC 167, TG 95, HDL 84, LDL 63  Past Medical History:  Diagnosis Date   Arthritis    Ascending aorta dilation    Chronic kidney disease    Dysrhythmia    Hyperlipidemia    Hypertension    Palpitations    Premature beats     Past Surgical History:  Procedure Laterality Date   APPENDECTOMY     BICEPT TENODESIS Left 12/20/2021   Procedure: Left reverse shoulder arthroplasty for proximal humerus fracture, biceps tenodesis;  Surgeon: Signa Kell, MD;  Location: [facility] ORS;  Service: Orthopedics;  Laterality: Left;   CESAREAN SECTION     x3   EYE SURGERY     cataract and eyelid   HERNIA REPAIR     REVERSE SHOULDER ARTHROPLASTY Left 12/20/2021   Procedure: Left reverse shoulder arthroplasty for proximal  humerus fracture, biceps tenodesis;  Surgeon: Signa Kell, MD;  Location: [facility] ORS;  Service: Orthopedics;  Laterality: Left;    Current Medications: Current Meds  Medication Sig   acetaminophen (TYLENOL) 650 MG CR tablet Take 650 mg by mouth every 8 (eight) hours as needed for pain.   amLODipine (NORVASC) 10 MG tablet Take 10 mg by mouth in the morning.   amLODipine (NORVASC) 5 MG tablet Take 1 tablet (5 mg total) by mouth  daily.   Cholecalciferol (VITAMIN D3) 50 MCG (2000 UT) TABS Take 2,000 Units by mouth in the morning.   Cyanocobalamin (B-12 PO) Take 1,000 mcg by mouth in the morning.   enalapril (VASOTEC) 20 MG tablet Take 20 mg by mouth in the morning.   ezetimibe-simvastatin (VYTORIN) 10-20 MG tablet Take 1 tablet by mouth in the morning.   Ferrous Sulfate (IRON PO) Take 1 tablet by mouth daily.   zolpidem (AMBIEN) 5 MG tablet Take 2.5 mg by mouth at bedtime as needed for sleep.    Allergies:   Patient has no known allergies.   Social History   Socioeconomic History   Marital status: Widowed    Spouse name: Not on file   Number of children: 3   Years of education: Not on file   Highest education level: Not on file  Occupational History   Not on file  Tobacco Use   Smoking status: Never   Smokeless tobacco: Never  Vaping Use   Vaping Use: Never used  Substance and Sexual Activity   Alcohol use: No   Drug use: No   Sexual activity: Not Currently  Other Topics Concern   Not on file  Social History Narrative   Lives at [facility]    HAs living will, full code   Social Determinants of Health   Financial Resource Strain: Low Risk  (12/05/2022)   Overall Financial Resource Strain (CARDIA)    Difficulty of Paying Living Expenses: Not hard at all  Food Insecurity: No Food Insecurity (12/05/2022)   Hunger Vital Sign    Worried About Running Out of Food in the Last Year: Never true    Ran Out of Food in the Last Year: Never true  Transportation Needs: No Transportation Needs (11/08/2021)   PRAPARE - Administrator, Civil Service (Medical): No    Lack of Transportation (Non-Medical): No  Physical Activity: Sufficiently Active (12/05/2022)   Exercise Vital Sign    Days of Exercise per Week: 4 days    Minutes of Exercise per Session: 50 min  Stress: No Stress Concern Present (12/05/2022)   [employer] of Occupational Health - Occupational Stress Questionnaire    Feeling of  Stress : Not at all  Social Connections: Moderately Isolated (12/05/2022)   Social Connection and Isolation Panel [NHANES]    Frequency of Communication with Friends and Family: More than three times a week    Frequency of Social Gatherings with Friends and Family: More than three times a week    Attends Religious Services: More than 4 times per year    Active Member of [employer] or Organizations: No    Attends Banker Meetings: Never    Marital Status: Widowed     Family History:  The patient's family history includes Breast cancer (age of onset: 70) in her cousin; Diabetes in her mother; Stroke in her father.  ROS:   12-point review of systems is negative unless otherwise noted in the HPI.   EKGs/Labs/Other Studies  Reviewed:    Studies reviewed were summarized above. The additional studies were reviewed today:  2D echo 03/09/2021: 1. Left ventricular ejection fraction, by estimation, is 60 to 65%. The  left ventricle has normal function. The left ventricle has no regional  wall motion abnormalities. Left ventricular diastolic parameters are  consistent with Grade I diastolic  dysfunction (impaired relaxation).   2. Right ventricular systolic function is normal. The right ventricular  size is normal. Tricuspid regurgitation signal is inadequate for assessing  PA pressure.   3. The mitral valve is normal in structure. Mild mitral valve  regurgitation.   4. Ectopy appreciated, heart rate 55 bpm. __________  Eugenie Birks MPI 02/21/2020: The study is normal. This is a low risk study. The left ventricular ejection fraction is hyperdynamic (>65%). There was no ST segment deviation noted during stress. No T wave inversion was noted during stress. __________   2D echo 09/2018: - Left ventricle: The cavity size was normal. Systolic function was    normal. The estimated ejection fraction was in the range of 60%    to 65%. Wall motion was normal; there were no regional wall     motion abnormalities. Doppler parameters are consistent with    abnormal left ventricular relaxation (grade 1 diastolic    dysfunction).  - Mitral valve: There was mild regurgitation.  - Left atrium: The atrium was normal in size.  - Right ventricle: Systolic function was normal.  - Pulmonary arteries: Systolic pressure was within the normal    range.  __________   ETT 08/2018: Blood pressure demonstrated a normal response to exercise. There was no ST segment deviation noted during stress. No T wave inversion was noted during stress.   Normal treadmill stress test with no evidence of ischemia. Below average exercise capacity with exercise duration of 3 minutes and 39 seconds achieving a maximum heart rate of 130 bpm which is 89% of maximal predicted heart rate.   Mild PACs noted in recovery. __________   Luci Bank 07/2018: Normal sinus rhythm with an average heart rate of 62 bpm. 4 beat run of wide-complex tachycardia at a rate of 105 bpm. 18 episodes of supraventricular tachycardia noted with the longest lasting 16 beats at a rate of 109 bpm.  Fastest tachycardia was 162 bpm but only lasted 6 beats. Occasional PACs with rare PVCs.   EKG:  EKG is ordered today.  The EKG ordered today demonstrates sinus bradycardia with rare PAC, 49 bpm, no acute ST-T changes  Recent Labs: 12/23/2022: ALT 9 12/27/2022: BUN 26; Creatinine, Ser 1.32; Hemoglobin 11.2; Platelets 269.0; Potassium 4.9; Sodium 141; TSH 2.55  Recent Lipid Panel    Component Value Date/Time   CHOL 167 12/23/2022 0910   TRIG 95.0 12/23/2022 0910   TRIG 68 12/12/2009 1413   HDL 84.50 12/23/2022 0910   CHOLHDL 2 12/23/2022 0910   VLDL 19.0 12/23/2022 0910   LDLCALC 63 12/23/2022 0910    PHYSICAL EXAM:    VS:  BP 128/76 (BP Location: Left Arm, Patient Position: Sitting, Cuff Size: Normal)   Pulse (!) 49   Ht 4\' 11"  (1.499 m)   Wt 147 lb 9.6 oz (67 kg)   SpO2 97%   BMI 29.81 kg/m   BMI: Body mass index is 29.81  kg/m.  Physical Exam Vitals reviewed.  Constitutional:      Appearance: She is well-developed.  HENT:     Head: Normocephalic and atraumatic.  Eyes:     General:  Right eye: No discharge.        Left eye: No discharge.  Neck:     Vascular: No JVD.  Cardiovascular:     Rate and Rhythm: Normal rate and regular rhythm.     Pulses:          Posterior tibial pulses are 2+ on the right side and 2+ on the left side.     Heart sounds: Normal heart sounds, S1 normal and S2 normal. Heart sounds not distant. No midsystolic click and no opening snap. No murmur heard.    No friction rub.     Comments: No nonhealing wounds on lower extremities. Pulmonary:     Effort: Pulmonary effort is normal. No respiratory distress.     Breath sounds: Normal breath sounds. No decreased breath sounds, wheezing or rales.  Chest:     Chest wall: No tenderness.  Abdominal:     General: There is no distension.  Musculoskeletal:     Cervical back: Normal range of motion.     Right lower leg: No edema.     Left lower leg: No edema.  Skin:    General: Skin is warm and dry.     Nails: There is no clubbing.  Neurological:     Mental Status: She is alert and oriented to person, place, and time.  Psychiatric:        Speech: Speech normal.        Behavior: Behavior normal.        Thought Content: Thought content normal.        Judgment: Judgment normal.     Wt Readings from Last 3 Encounters:  12/31/22 147 lb 9.6 oz (67 kg)  12/27/22 146 lb 6 oz (66.4 kg)  12/05/22 147 lb (66.7 kg)     Orthostatic vital signs: Lying: 125/75, 47 bpm Sitting: 116/70, 49 bpm Standing: 108/71, 51 bpm Standing x 3 minutes: 95/66, 55 bpm  Chronotropic response assessment: Heart rate peaked at 59 bpm with ambulation in the hallway with a pulse ox of 97% on room air.  ASSESSMENT & PLAN:   Near syncope: She noted an episode of lower extremity weakness with generalized fatigue and near syncope while ambulating in  [place] on a cruise excursion.  Following this event, she was asymptomatic, including with further ambulation.  Heart rate peaked at 59 bpm with ambulation in the office today.  She was asymptomatic with this.  Given history of bradycardic rates, placed Zio patch to evaluate for high-grade AV block, prolonged pauses, or significant arrhythmia.  Obtain echo to evaluate for any structural abnormalities.  May need to consider ischemic testing pending the above workup.  She has 2+ bilateral dorsalis pedis pulses, and symptoms are not consistent with claudication.  Decrease amlodipine to 5 mg daily.  PSVT: Quiescent.  Not on AV nodal blocking medication secondary to underlying bradycardic rates.  Sinus bradycardia: Historically, asymptomatic.  Workup as outlined above.  HTN: Blood pressure well-controlled in the office.  Reduce amlodipine as outlined above.  Otherwise, she remains on enalapril 20 mg.  HLD: LDL 63 in 12/2022, with normal AST/ALT at that time.  She remains on ezetimibe/simvastatin.  CKD stage IIIb: Stable on labs earlier this month.  Followed by nephrology.   Disposition: F/u with Dr. Kirke Corin or an APP in 2 months.   Medication Adjustments/Labs and Tests Ordered: Current medicines are reviewed at length with the patient today.  Concerns regarding medicines are outlined above. Medication changes, Labs and Tests ordered  today are summarized above and listed in the Patient Instructions accessible in Encounters.   Signed, Eula Listen, PA-C 12/31/2022 3:47 PM     [facility] [place] [address] [place], [place] [address] [phone]

## 2023-01-01 ENCOUNTER — Ambulatory Visit: Payer: Medicare PPO

## 2023-01-01 DIAGNOSIS — Z96612 Presence of left artificial shoulder joint: Secondary | ICD-10-CM | POA: Diagnosis not present

## 2023-01-01 DIAGNOSIS — R55 Syncope and collapse: Secondary | ICD-10-CM

## 2023-01-01 DIAGNOSIS — D649 Anemia, unspecified: Secondary | ICD-10-CM | POA: Diagnosis not present

## 2023-01-01 LAB — ECHOCARDIOGRAM COMPLETE
AR max vel: 2 cm2
AV Area VTI: 2.14 cm2
AV Area mean vel: 1.91 cm2
AV Mean grad: 4 mmHg
AV Peak grad: 8.8 mmHg
Ao pk vel: 1.48 m/s
Area-P 1/2: 3.27 cm2
S' Lateral: 2.7 cm

## 2023-01-02 ENCOUNTER — Telehealth: Payer: Self-pay | Admitting: Cardiovascular Disease

## 2023-01-02 ENCOUNTER — Emergency Department
Admission: EM | Admit: 2023-01-02 | Discharge: 2023-01-02 | Disposition: A | Discharge status: Home / Self Care | Payer: Medicare PPO | Attending: Emergency Medicine | Admitting: Emergency Medicine

## 2023-01-02 ENCOUNTER — Other Ambulatory Visit: Payer: Self-pay

## 2023-01-02 DIAGNOSIS — I129 Hypertensive chronic kidney disease with stage 1 through stage 4 chronic kidney disease, or unspecified chronic kidney disease: Secondary | ICD-10-CM | POA: Insufficient documentation

## 2023-01-02 DIAGNOSIS — R001 Bradycardia, unspecified: Secondary | ICD-10-CM

## 2023-01-02 DIAGNOSIS — N183 Chronic kidney disease, stage 3 unspecified: Secondary | ICD-10-CM | POA: Diagnosis not present

## 2023-01-02 DIAGNOSIS — R55 Syncope and collapse: Secondary | ICD-10-CM

## 2023-01-02 DIAGNOSIS — E875 Hyperkalemia: Secondary | ICD-10-CM | POA: Diagnosis not present

## 2023-01-02 DIAGNOSIS — I471 Supraventricular tachycardia, unspecified: Secondary | ICD-10-CM

## 2023-01-02 LAB — CBC
HCT: 35.3 % — ABNORMAL LOW (ref 36.0–46.0)
Hemoglobin: 11 g/dL — ABNORMAL LOW (ref 12.0–15.0)
MCH: 27.7 pg (ref 26.0–34.0)
MCHC: 31.2 g/dL (ref 30.0–36.0)
MCV: 88.9 fL (ref 80.0–100.0)
Platelets: 316 10*3/uL (ref 150–400)
RBC: 3.97 MIL/uL (ref 3.87–5.11)
RDW: 16.2 % — ABNORMAL HIGH (ref 11.5–15.5)
WBC: 6.2 10*3/uL (ref 4.0–10.5)
nRBC: 0 % (ref 0.0–0.2)

## 2023-01-02 LAB — BASIC METABOLIC PANEL
Anion gap: 8 (ref 5–15)
BUN: 22 mg/dL (ref 8–23)
CO2: 23 mmol/L (ref 22–32)
Calcium: 9.4 mg/dL (ref 8.9–10.3)
Chloride: 108 mmol/L (ref 98–111)
Creatinine, Ser: 1.08 mg/dL — ABNORMAL HIGH (ref 0.44–1.00)
GFR, Estimated: 52 mL/min — ABNORMAL LOW (ref >60)
Glucose, Bld: 124 mg/dL — ABNORMAL HIGH (ref 70–99)
Potassium: 5.5 mmol/L — ABNORMAL HIGH (ref 3.5–5.1)
Sodium: 139 mmol/L (ref 135–145)

## 2023-01-02 LAB — TROPONIN I (HIGH SENSITIVITY): Troponin I (High Sensitivity): 11 ng/L (ref <18)

## 2023-01-02 MED ORDER — VELTASSA 8.4 G PO PACK: 8.4000 g | PACK | Freq: Every day | ORAL | 0 refills | Status: AC

## 2023-01-02 MED ORDER — SODIUM ZIRCONIUM CYCLOSILICATE 10 G PO PACK
10.0000 g | PACK | Freq: Once | ORAL | Status: AC
  Administered 2023-01-02: 10 g via ORAL
  Filled 2023-01-02: qty 1

## 2023-01-02 NOTE — Telephone Encounter (Signed)
Would recommend ED evaluation for possible symptomatic bradycardia/heart block.

## 2023-01-02 NOTE — Telephone Encounter (Signed)
Patient c/o Palpitations:  High priority if patient c/o lightheadedness, shortness of breath, or chest pain  How long have you had palpitations/irregular HR/ Afib? Are you having the symptoms now? 15 to 20 mins , no symptoms right now  Are you currently experiencing lightheadedness, SOB or CP? No, just weak  Do you have a history of afib (atrial fibrillation) or irregular heart rhythm? Yes   Have you checked your BP or HR? (document readings if available): HR 48, normal is about 53 for her  Are you experiencing any other symptoms? No but patient is very concerned.

## 2023-01-02 NOTE — ED Notes (Signed)
Pt declines cxray at this time

## 2023-01-02 NOTE — ED Provider Notes (Signed)
[facility] Provider Note    Event Date/Time   First MD Initiated Contact with Patient 01/02/23 1557     (approximate)   History   Bradycardia   HPI  Natasha Sawyer is a 82 y.o. female past medical history of paroxysmal supraventricular tachycardia, CKD, hypertension hyperlipidemia who presents with bradycardia and fatigue.  Patient tells me that yesterday around 6 PM she felt like her heart rate beating fast.  She checked and heart rate was around 105.  Lasted about 20 minutes and she did end up taking a propranolol which she takes as needed for SVT.  She then felt okay.  This morning she woke up she felt very tired and like she just did not have much energy.  Denies chest pain palpitations lightheadedness or syncope.  Denies fever chills abdominal pain vomiting or urinary symptoms.  When she checked her heart rate it was in the low 40s about 43.  This is slightly lower than her baseline so she was concerned and called her cardiologist who told her to come to the emergency department.  Currently she feels back to baseline denies any fatigue.  Reviewed last cardiology note from 4/16 just 2 days ago.  Patient followed up with cardiology because she had episode of presyncope while on vacation recently.  Noted to have heart rates in the high 40s in the office.  She had an echocardiogram ordered and a Zio patch ordered which came in the mail today she is not yet wearing.  Noted that her SVT was quiescent and she is not on a beta-blocker because of history of sinus bradycardia.    Past Medical History:  Diagnosis Date   Arthritis    Ascending aorta dilation    Chronic kidney disease    Dysrhythmia    Hyperlipidemia    Hypertension    Palpitations    Premature beats     Patient Active Problem List   Diagnosis Date Noted   Hyperkalemia 12/27/2022   Chronic anemia 12/27/2022   Secondary hyperparathyroidism of renal origin 11/14/2020   PSVT (paroxysmal  supraventricular tachycardia) 11/14/2020   Vitamin B 12 deficiency 02/15/2020   Weakness 07/02/2019   Osteopenia 02/18/2017   Bradycardia 02/14/2012   CKD (chronic kidney disease) stage 3, GFR 30-59 ml/min 05/10/2009   Hyperlipidemia 05/11/2007   Essential hypertension, benign 05/11/2007   DISPLACEMENT, LUMBAR DISC W/O MYELOPATHY 05/11/2007     Physical Exam  Triage Vital Signs: ED Triage Vitals  Enc Vitals Group     BP 01/02/23 1343 132/78     Pulse Rate 01/02/23 1343 (!) 47     Resp 01/02/23 1343 18     Temp 01/02/23 1343 97.6 F (36.4 C)     Temp src --      SpO2 01/02/23 1343 97 %     Weight 01/02/23 1344 146 lb (66.2 kg)     Height 01/02/23 1344 4\' 11"  (1.499 m)     Head Circumference --      Peak Flow --      Pain Score 01/02/23 1344 0     Pain Loc --      Pain Edu? --      Excl. in GC? --     Most recent vital signs: Vitals:   01/02/23 1618 01/02/23 1630  BP:  (!) 153/72  Pulse: (!) 51 (!) 46  Resp: 20 18  Temp:    SpO2: 97% 98%     General: Awake, no  distress.  CV:  Good peripheral perfusion.  Bradycardic, extremities are warm well-perfused Resp:  Normal effort.  Lung sounds are clear Abd:  No distention.  Neuro:             Awake, Alert, Oriented x 3  Other:     ED Results / Procedures / Treatments  Labs (all labs ordered are listed, but only abnormal results are displayed) Labs Reviewed  BASIC METABOLIC PANEL - Abnormal; Notable for the following components:      Result Value   Potassium 5.5 (*)    Glucose, Bld 124 (*)    Creatinine, Ser 1.08 (*)    GFR, Estimated 52 (*)    All other components within normal limits  CBC - Abnormal; Notable for the following components:   Hemoglobin 11.0 (*)    HCT 35.3 (*)    RDW 16.2 (*)    All other components within normal limits  TROPONIN I (HIGH SENSITIVITY)     EKG  EKG reviewed interpreted myself shows sinus bradycardia normal axis normal intervals no acute ischemic  changes   RADIOLOGY    PROCEDURES:  Critical Care performed: No  .1-3 Lead EKG Interpretation  Performed by: Georga Hacking, MD Authorized by: Georga Hacking, MD     Interpretation: abnormal     ECG rate assessment: bradycardic     Rhythm: sinus bradycardia     Ectopy: none     Conduction: normal     The patient is on the cardiac monitor to evaluate for evidence of arrhythmia and/or significant heart rate changes.   MEDICATIONS ORDERED IN ED: Medications  sodium zirconium cyclosilicate (LOKELMA) packet 10 g (10 g Oral Given 01/02/23 1654)     IMPRESSION / MDM / ASSESSMENT AND PLAN / ED COURSE  I reviewed the triage vital signs and the nursing notes.                              Patient's presentation is most consistent with acute complicated illness / injury requiring diagnostic workup.  Differential diagnosis includes, but is not limited to, beta-blocker side effect, heart block, sinus bradycardia, electrolyte abnormality  The patient is a 82 year old female presents with fatigue and low heart rate.  Patient has a history of sinus bradycardia as well as paroxysmal SVT in the past.  She is not on a beta-blocker or any AV nodal blocking agent because of her low heart rates at baseline.  Yesterday she felt the heart rate was elevated when she took it was 105 so she took a propranolol which she has as needed.  Today has felt fatigued and she checked her heart rate was in the low 40s around 43.  Initial heart rate from triage was noted to be 47 EKG shows sinus bradycardia.  When I am evaluating the patient heart rate is in the low 50s.  She currently is asymptomatic.  When she woke up this morning she just felt very fatigued denying any syncope presyncope lightheadedness chest pain or dyspnea.  No other infectious or ischemic symptoms.  's are overall reassuring.  She is mildly hypokalemic with potassium of 5.5.  Has been elevated recently as well with 2 lab draws ago it  was 5.4.  Will give a dose of Lokelma.  Recommended low potassium diet and she had this repeated in about 5 to 7 days.  Troponin is negative and CBC reassuring.  Patient was monitored on  cardiac monitor and heart rates maintained in the low 50s.  This is at her baseline given she is in sinus rhythm and is asymptomatic she can be discharged with cardiology follow-up.  I will prescribe her with 2 additional doses of Lokelma.  She has labs scheduled to be drawn in 5 days and nephrology follow-up next week.  We discussed low potassium diet.       FINAL CLINICAL IMPRESSION(S) / ED DIAGNOSES   Final diagnoses:  Sinus bradycardia  Hyperkalemia     Rx / DC Orders   ED Discharge Orders     None        Note:  This document was prepared using Dragon voice recognition software and may include unintentional dictation errors.   Georga Hacking, MD 01/02/23 1700

## 2023-01-02 NOTE — ED Triage Notes (Signed)
Pt to ED for irregular HR started yesterday. States HR was fast yesterday, low today. Was sent by cardio to see if pt has symptomatic bradycardia or heart block. Denies cp. Denies n/v. Pt ambulatory to triage. NAD noted.

## 2023-01-02 NOTE — Telephone Encounter (Signed)
Returned call to patient and shared response from Eula Listen, PA-C:  Would recommend ED evaluation for possible symptomatic bradycardia/heart block.     Patient gave phone to daughter Gerre Pebbles, shared the above recommendation with her as well.  Patient and Kennon Rounds verbalized understanding and will proceed to ED for evaluation.

## 2023-01-02 NOTE — Discharge Instructions (Addendum)
Please continue to take your medications as prescribed.  I would avoid taking the propranolol unless your heart rates are consistently above 130 bpm.  Your potassium is elevated today at 5.5.  Please review the information above about a low potassium diet.  Take the veltassa packet tomorrow. Please see your doctor to have this repeated in about 5 to 7 days.

## 2023-01-02 NOTE — Telephone Encounter (Signed)
Returned call to patient and discussed symptoms.  Patient reports last night she had "real bad palpitations with maximum heart rate of 105 bpm" and she took propranolol  around 6:30pm. She states her HR came down to 52-53 bpm, which she states is her normal.  Patient states her daughter (who is a Surveyor, mining) is concerned about her low HR today around 43-47. Patient reports she also felt weak and washed out this morning, but not as much now while talking on the phone. Patient states daughter wants her to go to ED to be evaluated, patient does not want to go. Informed patient if she continue to have low HR and feeling weak she could go to Urgent Care.  Patient is still waiting for Zio heart monitor to be delivered.   Patient states she has not been drinking much fluids. Encouraged patient to hydrate and rest with feet elevated. If symptoms do not improve proceed to Urgent Care, if symptoms worsen proceed to ED.  Patient verbalized understanding.  Will forward to [employer], PA-C to review.

## 2023-01-03 DIAGNOSIS — D649 Anemia, unspecified: Secondary | ICD-10-CM | POA: Diagnosis not present

## 2023-01-06 ENCOUNTER — Other Ambulatory Visit (INDEPENDENT_AMBULATORY_CARE_PROVIDER_SITE_OTHER): Payer: Medicare PPO | Admitting: Family Medicine

## 2023-01-06 ENCOUNTER — Other Ambulatory Visit: Payer: Medicare PPO

## 2023-01-06 ENCOUNTER — Other Ambulatory Visit: Payer: Self-pay | Admitting: *Deleted

## 2023-01-06 DIAGNOSIS — Z0000 Encounter for general adult medical examination without abnormal findings: Secondary | ICD-10-CM

## 2023-01-06 DIAGNOSIS — D649 Anemia, unspecified: Secondary | ICD-10-CM

## 2023-01-06 DIAGNOSIS — D508 Other iron deficiency anemias: Secondary | ICD-10-CM

## 2023-01-07 ENCOUNTER — Telehealth: Payer: Self-pay

## 2023-01-07 DIAGNOSIS — N1831 Chronic kidney disease, stage 3a: Secondary | ICD-10-CM | POA: Diagnosis not present

## 2023-01-07 LAB — POC HEMOCCULT BLD/STL (OFFICE/1-CARD/DIAGNOSTIC)
Fecal Occult Blood, POC: NEGATIVE
Fecal Occult Blood, POC: NEGATIVE
Fecal Occult Blood, POC: NEGATIVE

## 2023-01-07 NOTE — Addendum Note (Signed)
Addended by: Damita Lack on: 01/07/2023 05:24 PM   Modules accepted: Orders

## 2023-01-07 NOTE — Telephone Encounter (Signed)
        Patient  visited [place] on 4/18    Telephone encounter attempt : 1st   A HIPAA compliant voice message was left requesting a return call.  Instructed patient to call back     Lenard Forth [facility] Guide, [facility] [phone] [address] [place], , [place] [address] Phone: [phone] Email: [email or web address]

## 2023-01-08 ENCOUNTER — Telehealth: Payer: Self-pay

## 2023-01-08 NOTE — Telephone Encounter (Signed)
        Patient  visited [place] on 4/18    Telephone encounter attempt :  2nd  A HIPAA compliant voice message was left requesting a return call.  Instructed patient to call back   Lenard Forth [facility] Guide, [facility] [phone] [address] [place], [place], [place] [address] Phone: [phone] Email: [email or web address]

## 2023-01-13 DIAGNOSIS — I10 Essential (primary) hypertension: Secondary | ICD-10-CM | POA: Diagnosis not present

## 2023-01-13 DIAGNOSIS — N2581 Secondary hyperparathyroidism of renal origin: Secondary | ICD-10-CM | POA: Diagnosis not present

## 2023-01-13 DIAGNOSIS — N1831 Chronic kidney disease, stage 3a: Secondary | ICD-10-CM | POA: Diagnosis not present

## 2023-01-13 DIAGNOSIS — R809 Proteinuria, unspecified: Secondary | ICD-10-CM | POA: Diagnosis not present

## 2023-01-13 DIAGNOSIS — D631 Anemia in chronic kidney disease: Secondary | ICD-10-CM | POA: Diagnosis not present

## 2023-01-15 ENCOUNTER — Telehealth: Payer: Self-pay | Admitting: Cardiovascular Disease

## 2023-01-15 NOTE — Telephone Encounter (Signed)
The patient has been made aware that the monitor is a two week monitor is a 2 week monitor. If need be, then another monitor may be ordered. She will mail the monitor back and wait for the results.

## 2023-01-15 NOTE — Telephone Encounter (Signed)
Patient is calling because her heart monitor is supposed to come tomorrow. Patient stated that she has only had one episode and would like to know if she can keep it on longer. Patient was unsure if insurance would be in an issue with her wearing the heart monitor longer than what was originally told. Please advise.

## 2023-01-21 DIAGNOSIS — I471 Supraventricular tachycardia, unspecified: Secondary | ICD-10-CM | POA: Diagnosis not present

## 2023-01-21 DIAGNOSIS — R55 Syncope and collapse: Secondary | ICD-10-CM | POA: Diagnosis not present

## 2023-02-20 DIAGNOSIS — H26491 Other secondary cataract, right eye: Secondary | ICD-10-CM | POA: Diagnosis not present

## 2023-02-20 DIAGNOSIS — Z9842 Cataract extraction status, left eye: Secondary | ICD-10-CM | POA: Diagnosis not present

## 2023-02-20 DIAGNOSIS — Z9841 Cataract extraction status, right eye: Secondary | ICD-10-CM | POA: Diagnosis not present

## 2023-02-20 DIAGNOSIS — H52223 Regular astigmatism, bilateral: Secondary | ICD-10-CM | POA: Diagnosis not present

## 2023-02-28 NOTE — Progress Notes (Unsigned)
Cardiology Office Note    Date:  03/03/2023   ID:  Natasha Sawyer, DOB [birth year], MRN [id number]  PCP:  Natasha Seltzer, MD  Cardiologist:  Natasha Bears, MD  Electrophysiologist:  None   Chief Complaint: Follow up  History of Present Illness:   Natasha Sawyer is a 82 y.o. female with history of PSVT, bradycardia, CKD stage IIIa followed by nephrology, anemia of chronic disease, HTN, and HLD who presents for follow-up of SVT and bradycardia.   She was evaluated in 2013 for palpitations, atypical chest pain, and SOB with treadmill stress test being normal. Echo showed a normal LVSF with no significant valvular abnormalities. 48-hour Holter monitor showed sinus rhythm with frequent PACs and no significant arrhythmia. She was seen in 2019 with recurrent palpitations. Zio patch showed sinus rhythm with a 4-beat run of WCT, 18 episodes of SVT with the longest lasting 16 beats, and occasional PACs and PVCs. Treadmill stress test in 08/2018 showed no evidence of ischemia. She was able to exercise for 3 minutes and 39 seconds. Echo in 09/2018, showed normal LVSF, mild mitral regurgitation, and normal PASP.  Lexiscan Myoview on 02/21/2020 showed no significant ischemia with an EF greater than 65% and was overall a low risk study.  Echo on 02/22/2020 showed an EF of 55 to 60%, no regional wall motion abnormalities, diastolic dysfunction, normal RV systolic function and normal RV ventricular cavity size, normal PASP, mild mitral regurgitation, and mildly dilated ascending aorta measuring 36 mm.    She was last seen in the office on 12/31/2022 and remained without symptoms of angina or cardiac decompensation.  She reported an episode of lower extremity weakness with generalized fatigue and near syncope while ambulating in [place] on a cruise excursion.  Following this episode, she was without further symptoms.  She remained bradycardic with a heart rate of 49 bpm in the office with appropriate chronotropic  competence demonstrated while in the office.  Amlodipine was decreased to 5 mg daily.  Zio patch was applied.  She was seen in the ED on 01/02/2023 reporting an episode of palpitations the night prior with a heart rate around 105 bpm.  She took a as needed propranolol with symptomatic improvement and palpitations after approximately 20 minutes.  Upon waking the next morning, she felt fatigued with a heart rate in the low 40s bpm.  Heart rate in the ER ranged from 46 to 51 bpm.  She was mildly hyperkalemic with a potassium of 5.5, and was given a dose of Lokelma.  EKG showed sinus bradycardia.  High-sensitivity troponin was negative.  Heart rate maintained in the low 50s bpm, which is around her baseline, and she was discharged to outpatient follow-up.  Subsequent Zio patch demonstrated a predominant rhythm of sinus with an average rate of 50 bpm (range 38 to 144 bpm), first-degree AV block, 30 episodes of SVT with the fastest interval lasting 7 beats with a maximal rate of 144 bpm and the longest interval lasting 9 beats with an average rate of 102 bpm.  Rare PACs and PVCs.  The lowest heart rate of 38 bpm occurred at 3:31 AM.  Echo in 12/2022 demonstrated an EF of 60 to 65%, no regional wall motion abnormalities, grade 1 diastolic dysfunction, normal RV systolic function and ventricular cavity size, mild mitral regurgitation, and a normal CVP.  She comes in doing well from a cardiac perspective and is without symptoms of angina or cardiac decompensation.  Since she was last seen,  she did have 1 episode of tachypalpitations while ambulating around her complex for approximately 1 mile (typically she ambulates for half a mile).  She indicates heart rate peaked at 105 bpm.  This was associated with some fatigue.  She does wonder if she was tachycardic and fatigue in the setting of ambulating for a longer timeframe.  Otherwise, heart rate has remained stable in the mid 50s bpm.  No dizziness, presyncope, or syncope.   Has not needed any further as needed propranolol.  No lower extremity swelling, abdominal distention, or progressive orthopnea.  No falls.   Labs independently reviewed: 12/2022 - Hgb 10.4, PLT 302, BUN 26, serum creatinine 1.12, potassium 5.3, albumin 3.9, TSH normal, AST/ALT normal, TC 67, TG 95, HDL 84, LDL 63  Past Medical History:  Diagnosis Date   Arthritis    Ascending aorta dilation (HCC)    Chronic kidney disease    Dysrhythmia    Hyperlipidemia    Hypertension    Palpitations    Premature beats     Past Surgical History:  Procedure Laterality Date   APPENDECTOMY     BICEPT TENODESIS Left 12/20/2021   Procedure: Left reverse shoulder arthroplasty for proximal humerus fracture, biceps tenodesis;  Surgeon: Natasha Kell, MD;  Location: [facility] ORS;  Service: Orthopedics;  Laterality: Left;   CESAREAN SECTION     x3   EYE SURGERY     cataract and eyelid   HERNIA REPAIR     REVERSE SHOULDER ARTHROPLASTY Left 12/20/2021   Procedure: Left reverse shoulder arthroplasty for proximal humerus fracture, biceps tenodesis;  Surgeon: Natasha Kell, MD;  Location: [facility] ORS;  Service: Orthopedics;  Laterality: Left;    Current Medications: Current Meds  Medication Sig   acetaminophen (TYLENOL) 650 MG CR tablet Take 650 mg by mouth every 8 (eight) hours as needed for pain.   amLODipine (NORVASC) 5 MG tablet Take 1 tablet (5 mg total) by mouth daily.   Cholecalciferol (VITAMIN D3) 50 MCG (2000 UT) TABS Take 2,000 Units by mouth in the morning.   Cyanocobalamin (B-12 PO) Take 1,000 mcg by mouth in the morning.   enalapril (VASOTEC) 20 MG tablet Take 20 mg by mouth in the morning.   ezetimibe-simvastatin (VYTORIN) 10-20 MG tablet Take 1 tablet by mouth in the morning.   Ferrous Sulfate (IRON PO) Take 1 tablet by mouth daily.   zolpidem (AMBIEN) 5 MG tablet Take 2.5 mg by mouth at bedtime as needed for sleep.   [DISCONTINUED] propranolol (INDERAL) 10 MG tablet Take 10 mg by mouth daily as  needed (palpitations.).    Allergies:   Patient has no known allergies.   Social History   Socioeconomic History   Marital status: Widowed    Spouse name: Not on file   Number of children: 3   Years of education: Not on file   Highest education level: Not on file  Occupational History   Not on file  Tobacco Use   Smoking status: Never   Smokeless tobacco: Never  Vaping Use   Vaping Use: Never used  Substance and Sexual Activity   Alcohol use: No   Drug use: No   Sexual activity: Not Currently  Other Topics Concern   Not on file  Social History Narrative   Lives at [facility]    HAs living will, full code   Social Determinants of Health   Financial Resource Strain: Low Risk  (12/05/2022)   Overall Financial Resource Strain (CARDIA)    Difficulty  of Paying Living Expenses: Not hard at all  Food Insecurity: No Food Insecurity (12/05/2022)   Hunger Vital Sign    Worried About Running Out of Food in the Last Year: Never true    Ran Out of Food in the Last Year: Never true  Transportation Needs: No Transportation Needs (11/08/2021)   PRAPARE - Administrator, Civil Service (Medical): No    Lack of Transportation (Non-Medical): No  Physical Activity: Sufficiently Active (12/05/2022)   Exercise Vital Sign    Days of Exercise per Week: 4 days    Minutes of Exercise per Session: 50 min  Stress: No Stress Concern Present (12/05/2022)   [employer] of Occupational Health - Occupational Stress Questionnaire    Feeling of Stress : Not at all  Social Connections: Moderately Isolated (12/05/2022)   Social Connection and Isolation Panel [NHANES]    Frequency of Communication with Friends and Family: More than three times a week    Frequency of Social Gatherings with Friends and Family: More than three times a week    Attends Religious Services: More than 4 times per year    Active Member of [employer] or Organizations: No    Attends Banker Meetings: Never     Marital Status: Widowed     Family History:  The patient's family history includes Breast cancer (age of onset: 38) in her cousin; Diabetes in her mother; Stroke in her father.  ROS:   12-point review of systems is negative unless otherwise noted in the HPI.   EKGs/Labs/Other Studies Reviewed:    Studies reviewed were summarized above. The additional studies were reviewed today:  Zio patch 12/2022: Patient had a min HR of 38 bpm, max HR of 144 bpm, and avg HR of 50 bpm. Predominant underlying rhythm was Sinus Rhythm.  First Degree AV Block was present. 30 Supraventricular Tachycardia runs occurred, the run with the fastest interval lasting 7 beats with a max rate of 144 bpm, the longest lasting 9 beats with an avg rate of 102 bpm.  Rare PACs and rare PVCs. __________  2D echo 01/01/2023: 1. Left ventricular ejection fraction, by estimation, is 60 to 65%. The  left ventricle has normal function. The left ventricle has no regional  wall motion abnormalities. Left ventricular diastolic parameters are  consistent with Grade I diastolic  dysfunction (impaired relaxation). The average left ventricular global  longitudinal strain is -22.1 %. The global longitudinal strain is normal.   2. Right ventricular systolic function is normal. The right ventricular  size is normal.   3. The mitral valve is normal in structure. Mild mitral valve  regurgitation.   4. The aortic valve was not well visualized. Aortic valve regurgitation  is not visualized.   5. The inferior vena cava is normal in size with greater than 50%  respiratory variability, suggesting right atrial pressure of 3 mmHg.   Comparison(s): 03/09/21 60-65%.  __________  2D echo 03/09/2021: 1. Left ventricular ejection fraction, by estimation, is 60 to 65%. The  left ventricle has normal function. The left ventricle has no regional  wall motion abnormalities. Left ventricular diastolic parameters are  consistent with Grade I  diastolic  dysfunction (impaired relaxation).   2. Right ventricular systolic function is normal. The right ventricular  size is normal. Tricuspid regurgitation signal is inadequate for assessing  PA pressure.   3. The mitral valve is normal in structure. Mild mitral valve  regurgitation.   4. Ectopy appreciated, heart rate  55 bpm. __________   Eugenie Birks MPI 02/21/2020: The study is normal. This is a low risk study. The left ventricular ejection fraction is hyperdynamic (>65%). There was no ST segment deviation noted during stress. No T wave inversion was noted during stress. __________   2D echo 09/2018: - Left ventricle: The cavity size was normal. Systolic function was    normal. The estimated ejection fraction was in the range of 60%    to 65%. Wall motion was normal; there were no regional wall    motion abnormalities. Doppler parameters are consistent with    abnormal left ventricular relaxation (grade 1 diastolic    dysfunction).  - Mitral valve: There was mild regurgitation.  - Left atrium: The atrium was normal in size.  - Right ventricle: Systolic function was normal.  - Pulmonary arteries: Systolic pressure was within the normal    range.  __________   ETT 08/2018: Blood pressure demonstrated a normal response to exercise. There was no ST segment deviation noted during stress. No T wave inversion was noted during stress.   Normal treadmill stress test with no evidence of ischemia. Below average exercise capacity with exercise duration of 3 minutes and 39 seconds achieving a maximum heart rate of 130 bpm which is 89% of maximal predicted heart rate.   Mild PACs noted in recovery. __________   Luci Bank 07/2018: Normal sinus rhythm with an average heart rate of 62 bpm. 4 beat run of wide-complex tachycardia at a rate of 105 bpm. 18 episodes of supraventricular tachycardia noted with the longest lasting 16 beats at a rate of 109 bpm.  Fastest tachycardia was 162 bpm but  only lasted 6 beats. Occasional PACs with rare PVCs.   EKG:  EKG is ordered today.  The EKG ordered today demonstrates sinus bradycardia, 55 bpm, no acute ST-T changes  Recent Labs: 12/23/2022: ALT 9 12/27/2022: TSH 2.55 01/02/2023: BUN 22; Creatinine, Ser 1.08; Hemoglobin 11.0; Platelets 316; Potassium 5.5; Sodium 139  Recent Lipid Panel    Component Value Date/Time   CHOL 167 12/23/2022 0910   TRIG 95.0 12/23/2022 0910   TRIG 68 12/12/2009 1413   HDL 84.50 12/23/2022 0910   CHOLHDL 2 12/23/2022 0910   VLDL 19.0 12/23/2022 0910   LDLCALC 63 12/23/2022 0910    PHYSICAL EXAM:    VS:  BP 128/70 (BP Location: Left Arm, Patient Position: Sitting, Cuff Size: Normal)   Pulse (!) 55   Ht 4\' 11"  (1.499 m)   Wt 149 lb (67.6 kg)   SpO2 97%   BMI 30.09 kg/m   BMI: Body mass index is 30.09 kg/m.  Physical Exam Vitals reviewed.  Constitutional:      Appearance: She is well-developed.  HENT:     Head: Normocephalic and atraumatic.  Eyes:     General:        Right eye: No discharge.        Left eye: No discharge.  Neck:     Vascular: No JVD.  Cardiovascular:     Rate and Rhythm: Regular rhythm. Bradycardia present.     Pulses:          Posterior tibial pulses are 2+ on the right side and 2+ on the left side.     Heart sounds: Normal heart sounds, S1 normal and S2 normal. Heart sounds not distant. No midsystolic click and no opening snap. No murmur heard.    No friction rub.  Pulmonary:     Effort: Pulmonary effort is normal.  No respiratory distress.     Breath sounds: Normal breath sounds. No decreased breath sounds, wheezing or rales.  Chest:     Chest wall: No tenderness.  Abdominal:     General: There is no distension.  Musculoskeletal:     Cervical back: Normal range of motion.     Right lower leg: No edema.     Left lower leg: No edema.  Skin:    General: Skin is warm and dry.     Nails: There is no clubbing.  Neurological:     Mental Status: She is alert and  oriented to person, place, and time.  Psychiatric:        Speech: Speech normal.        Behavior: Behavior normal.        Thought Content: Thought content normal.        Judgment: Judgment normal.     Wt Readings from Last 3 Encounters:  03/03/23 149 lb (67.6 kg)  01/02/23 146 lb (66.2 kg)  12/31/22 147 lb 9.6 oz (67 kg)     ASSESSMENT & PLAN:   PSVT: Brief paroxysms of SVT noted on recent Zio patch with the longest episode lasting just 9 beats.  Given baseline bradycardic rates, defer initiation of standing beta-blocker or nondihydropyridine calcium channel blocker.  No indication for AAT or ablation.  She will notify us if symptoms become more frequent or longer lasting.  Change instructions on as needed propranolol to as needed for heart rate greater than 130 bpm in an effort to minimize significant bradycardia.  If she sees that she is needing as needed propranolol on a regular basis, would recommend EP evaluation for further input.  Bradycardia: Asymptomatic.  Not on AV nodal blocking medications.  Thyroid function normal.  She has demonstrated appropriate chronotropic competence in the office and with Zio patch with a maximum sinus heart rate of 90 bpm on recent Zio patch.  No indication for pacemaker implantation at this time.  HTN: Blood pressure well-controlled in the office today.  She remains on amlodipine 5 mg and  HLD: LDL 63 in 12/2022 with normal AST/ALT at that time.  She remains on simvastatin/ezetimibe.     Disposition: F/u with Dr. Kirke Corin or an APP in 6 months.   Medication Adjustments/Labs and Tests Ordered: Current medicines are reviewed at length with the patient today.  Concerns regarding medicines are outlined above. Medication changes, Labs and Tests ordered today are summarized above and listed in the Patient Instructions accessible in Encounters.   Signed, Eula Listen, PA-C 03/03/2023 1:54 PM     [facility] [place] [address] [place], [place] [address] [phone]

## 2023-03-03 ENCOUNTER — Ambulatory Visit: Payer: Medicare PPO | Attending: Physician Assistant | Admitting: Physician Assistant

## 2023-03-03 ENCOUNTER — Encounter: Payer: Self-pay | Admitting: Physician Assistant

## 2023-03-03 VITALS — BP 128/70 | HR 55 | Ht 59.0 in | Wt 149.0 lb

## 2023-03-03 DIAGNOSIS — I471 Supraventricular tachycardia, unspecified: Secondary | ICD-10-CM | POA: Diagnosis not present

## 2023-03-03 DIAGNOSIS — E785 Hyperlipidemia, unspecified: Secondary | ICD-10-CM

## 2023-03-03 DIAGNOSIS — R001 Bradycardia, unspecified: Secondary | ICD-10-CM

## 2023-03-03 DIAGNOSIS — I10 Essential (primary) hypertension: Secondary | ICD-10-CM | POA: Diagnosis not present

## 2023-03-03 MED ORDER — PROPRANOLOL HCL 10 MG PO TABS: 10.0000 mg | ORAL_TABLET | Freq: Every day | ORAL | 3 refills | Status: DC | PRN

## 2023-03-03 NOTE — Patient Instructions (Addendum)
Medication Instructions:   Take Propanolol 10 mg as needed daily for palpitations or heart rate greater than 130  Your physician recommends that you continue on your current medications as directed. Please refer to the Current Medication list given to you today.   *If you need a refill on your cardiac medications before your next appointment, please call your pharmacy*   Lab Work: none If you have labs (blood work) drawn today and your tests are completely normal, you will receive your results only by: MyChart Message (if you have MyChart) OR A paper copy in the mail If you have any lab test that is abnormal or we need to change your treatment, we will call you to review the results.   Testing/Procedures: none   Follow-Up: At [facility], you and your health needs are our priority.  As part of our continuing mission to provide you with exceptional heart care, we have created designated Provider Care Teams.  These Care Teams include your primary Cardiologist (physician) and Advanced Practice Providers (APPs -  Physician Assistants and Nurse Practitioners) who all work together to provide you with the care you need, when you need it.   Your next appointment:   6 month(s)  Provider:   Lorine Bears, MD or Eula Listen, PA-C

## 2023-03-10 DIAGNOSIS — Z961 Presence of intraocular lens: Secondary | ICD-10-CM | POA: Diagnosis not present

## 2023-03-10 DIAGNOSIS — H26491 Other secondary cataract, right eye: Secondary | ICD-10-CM | POA: Diagnosis not present

## 2023-04-29 ENCOUNTER — Ambulatory Visit: Payer: Medicare PPO | Admitting: Cardiovascular Disease

## 2023-05-14 DIAGNOSIS — R809 Proteinuria, unspecified: Secondary | ICD-10-CM | POA: Diagnosis not present

## 2023-05-14 DIAGNOSIS — N1831 Chronic kidney disease, stage 3a: Secondary | ICD-10-CM | POA: Diagnosis not present

## 2023-05-20 DIAGNOSIS — N1831 Chronic kidney disease, stage 3a: Secondary | ICD-10-CM | POA: Diagnosis not present

## 2023-05-20 DIAGNOSIS — N2581 Secondary hyperparathyroidism of renal origin: Secondary | ICD-10-CM | POA: Diagnosis not present

## 2023-05-20 DIAGNOSIS — I10 Essential (primary) hypertension: Secondary | ICD-10-CM | POA: Diagnosis not present

## 2023-05-20 DIAGNOSIS — D631 Anemia in chronic kidney disease: Secondary | ICD-10-CM | POA: Diagnosis not present

## 2023-05-20 DIAGNOSIS — R809 Proteinuria, unspecified: Secondary | ICD-10-CM | POA: Diagnosis not present

## 2023-08-19 ENCOUNTER — Encounter: Payer: Self-pay | Admitting: Cardiovascular Disease

## 2023-08-19 ENCOUNTER — Ambulatory Visit: Payer: Medicare PPO | Attending: Cardiovascular Disease | Admitting: Cardiovascular Disease

## 2023-08-19 VITALS — BP 140/80 | HR 50 | Ht 59.0 in | Wt 147.4 lb

## 2023-08-19 DIAGNOSIS — R001 Bradycardia, unspecified: Secondary | ICD-10-CM | POA: Diagnosis not present

## 2023-08-19 DIAGNOSIS — E785 Hyperlipidemia, unspecified: Secondary | ICD-10-CM | POA: Diagnosis not present

## 2023-08-19 DIAGNOSIS — I10 Essential (primary) hypertension: Secondary | ICD-10-CM | POA: Diagnosis not present

## 2023-08-19 DIAGNOSIS — N1832 Chronic kidney disease, stage 3b: Secondary | ICD-10-CM | POA: Diagnosis not present

## 2023-08-19 DIAGNOSIS — I471 Supraventricular tachycardia, unspecified: Secondary | ICD-10-CM | POA: Diagnosis not present

## 2023-08-19 NOTE — Patient Instructions (Signed)

## 2023-08-19 NOTE — Progress Notes (Signed)
Cardiology Office Note   Date:  08/19/2023   ID:  Natasha Sawyer, DOB Feb 13, 1941, MRN [id number]  PCP:  Excell Seltzer, MD  Cardiologist:   Lorine Bears, MD   Chief Complaint  Patient presents with   Follow-up    6 month f/u no complaints today. Meds reviewed verbally with pt.      History of Present Illness: Natasha Sawyer is a 82 y.o. female who is here today for follow-up visit regarding palpitations due to short SVT.   She has multiple chronic medical conditions including essential hypertension, hyperlipidemia and mild chronic kidney disease. He was initially seen in 2013 for palpitations, atypical chest pain and shortness of breath.  She underwent a treadmill stress test which was normal.  Echocardiogram showed normal ejection fraction with no significant valvular abnormalities.  48-hour Holter monitor showed normal sinus rhythm with frequent PACs and no other significant arrhythmia.  She was seen again in 2019 for recurrent palpitations.  She had a Zio monitor which showed normal sinus rhythm with a 4 beat run of wide-complex tachycardia, 18 episodes of SVT the longest lasted 16 beats and occasional PACs and PVCs.  She underwent a treadmill stress test in December 2019 which showed no evidence of ischemia.    Due to baseline bradycardia, she was not started on any medications but was given propranolol to be used as needed.  She was seen in our office in April with increased palpitations and fatigue.  Outpatient monitor showed short runs of SVT with an average heart rate of 50 bpm.  Echocardiogram showed normal LV systolic function with mild mitral regurgitation. Her symptoms resolved since then and she has been feeling well.  She denies chest pain, worsening dyspnea or palpitations at this time.   Past Medical History:  Diagnosis Date   Arthritis    Ascending aorta dilation (HCC)    Chronic kidney disease    Dysrhythmia    Hyperlipidemia    Hypertension    Palpitations     Premature beats     Past Surgical History:  Procedure Laterality Date   APPENDECTOMY     BICEPT TENODESIS Left 12/20/2021   Procedure: Left reverse shoulder arthroplasty for proximal humerus fracture, biceps tenodesis;  Surgeon: Signa Kell, MD;  Location: [facility] ORS;  Service: Orthopedics;  Laterality: Left;   CESAREAN SECTION     x3   EYE SURGERY     cataract and eyelid   HERNIA REPAIR     REVERSE SHOULDER ARTHROPLASTY Left 12/20/2021   Procedure: Left reverse shoulder arthroplasty for proximal humerus fracture, biceps tenodesis;  Surgeon: Signa Kell, MD;  Location: [facility] ORS;  Service: Orthopedics;  Laterality: Left;     Current Outpatient Medications  Medication Sig Dispense Refill   acetaminophen (TYLENOL) 650 MG CR tablet Take 650 mg by mouth every 8 (eight) hours as needed for pain.     amLODipine (NORVASC) 5 MG tablet Take 1 tablet (5 mg total) by mouth daily. 90 tablet 3   Cholecalciferol (VITAMIN D3) 50 MCG (2000 UT) TABS Take 2,000 Units by mouth in the morning.     Cyanocobalamin (B-12 PO) Take 1,000 mcg by mouth in the morning.     enalapril (VASOTEC) 20 MG tablet Take 20 mg by mouth in the morning.     ezetimibe-simvastatin (VYTORIN) 10-20 MG tablet Take 1 tablet by mouth in the morning. 90 tablet 3   Ferrous Sulfate (IRON PO) Take 1 tablet by mouth daily.  propranolol (INDERAL) 10 MG tablet Take 1 tablet (10 mg total) by mouth daily as needed (for palpitations or a heart rate greater than 130.). 90 tablet 3   zolpidem (AMBIEN) 5 MG tablet Take 2.5 mg by mouth at bedtime as needed for sleep.     No current facility-administered medications for this visit.    Allergies:   Patient has no known allergies.    Social History:  The patient  reports that she has never smoked. She has never used smokeless tobacco. She reports that she does not drink alcohol and does not use drugs.   Family History:  The patient's family history includes Breast cancer (age of onset: 61)  in her cousin; Diabetes in her mother; Stroke in her father.    ROS:  Please see the history of present illness.   Otherwise, review of systems are positive for none.   All other systems are reviewed and negative.    PHYSICAL EXAM: VS:  BP (!) 140/80 (BP Location: Left Arm, Patient Position: Sitting, Cuff Size: Large)   Pulse (!) 50   Ht 4\' 11"  (1.499 m)   Wt 147 lb 6 oz (66.8 kg)   SpO2 98%   BMI 29.77 kg/m  , BMI Body mass index is 29.77 kg/m. GEN: Well nourished, well developed, in no acute distress  HEENT: normal  Neck: no JVD, carotid bruits, or masses Cardiac: RRR; no rubs, or gallops,no edema .  2 out of 6 systolic murmur in the aortic area. Respiratory:  clear to auscultation bilaterally, normal work of breathing GI: soft, nontender, nondistended, + BS MS: no deformity or atrophy  Skin: warm and dry, no rash Neuro:  Strength and sensation are intact Psych: euthymic mood, full affect   EKG:  EKG is ordered today. The ekg ordered today demonstrates: Sinus bradycardia When compared with ECG of 02-Jan-2023 13:49, No significant change was found    Recent Labs: 12/23/2022: ALT 9 12/27/2022: TSH 2.55 01/02/2023: BUN 22; Creatinine, Ser 1.08; Hemoglobin 11.0; Platelets 316; Potassium 5.5; Sodium 139    Lipid Panel    Component Value Date/Time   CHOL 167 12/23/2022 0910   TRIG 95.0 12/23/2022 0910   TRIG 68 12/12/2009 1413   HDL 84.50 12/23/2022 0910   CHOLHDL 2 12/23/2022 0910   VLDL 19.0 12/23/2022 0910   LDLCALC 63 12/23/2022 0910      Wt Readings from Last 3 Encounters:  08/19/23 147 lb 6 oz (66.8 kg)  03/03/23 149 lb (67.6 kg)  01/02/23 146 lb (66.2 kg)           08/07/2018    4:07 PM  PAD Screen  Previous PAD dx? No  Previous surgical procedure? No  Pain with walking? No  Feet/toe relief with dangling? No  Painful, non-healing ulcers? No  Extremities discolored? No      ASSESSMENT AND PLAN:  1.  Paroxysmal supraventricular tachycardia:  She has mild symptoms overall and does not require any medication.  She does have propranolol to be used as needed but has not required this medication in the last 6 months.  2.  Sinus bradycardia: This has been a chronic issue for her and she does not appear to be symptomatic from this.  If her arrhythmia worsens, she might require a permanent pacemaker to be able to treat.  3.  Essential hypertension: Blood pressure is well controlled on current medications.  4.  Hyperlipidemia: Most recent lipid profile showed an LDL of 63.  Continue Vytorin.  5.  Chronic kidney disease stable overall and followed by nephrology.  Most recent GFR was 51.   Disposition:   FU in 6 months.  Signed,  Lorine Bears, MD  08/19/2023 2:58 PM    [facility]

## 2023-09-04 ENCOUNTER — Telehealth: Payer: Self-pay | Admitting: Cardiovascular Disease

## 2023-09-04 ENCOUNTER — Other Ambulatory Visit: Payer: Self-pay | Admitting: Cardiovascular Disease

## 2023-09-04 ENCOUNTER — Other Ambulatory Visit: Payer: Self-pay

## 2023-09-04 MED ORDER — EZETIMIBE-SIMVASTATIN 10-20 MG PO TABS: 1.0000 | ORAL_TABLET | Freq: Every morning | ORAL | 3 refills | Status: DC

## 2023-09-04 NOTE — Telephone Encounter (Signed)
*  STAT* If patient is at the pharmacy, call can be transferred to refill team.   1. Which medications need to be refilled? (please list name of each medication and dose if known)   ezetimibe-simvastatin (VYTORIN) 10-20 MG tablet   2. Which pharmacy/location (including street and city if local pharmacy) is medication to be sent to? [facility] - [place], [place] - [address] Phone: [phone]  Fax: [phone]     3. Do they need a 30 day or 90 day supply? 90  Pt is completely out of medication

## 2023-09-18 DIAGNOSIS — N1831 Chronic kidney disease, stage 3a: Secondary | ICD-10-CM | POA: Diagnosis not present

## 2023-09-22 DIAGNOSIS — D631 Anemia in chronic kidney disease: Secondary | ICD-10-CM | POA: Diagnosis not present

## 2023-09-22 DIAGNOSIS — I10 Essential (primary) hypertension: Secondary | ICD-10-CM | POA: Diagnosis not present

## 2023-09-22 DIAGNOSIS — N1831 Chronic kidney disease, stage 3a: Secondary | ICD-10-CM | POA: Diagnosis not present

## 2023-09-22 DIAGNOSIS — N2581 Secondary hyperparathyroidism of renal origin: Secondary | ICD-10-CM | POA: Diagnosis not present

## 2023-09-22 DIAGNOSIS — R809 Proteinuria, unspecified: Secondary | ICD-10-CM | POA: Diagnosis not present

## 2023-09-24 ENCOUNTER — Other Ambulatory Visit: Payer: Self-pay | Admitting: Family Medicine

## 2023-09-24 DIAGNOSIS — Z1231 Encounter for screening mammogram for malignant neoplasm of breast: Secondary | ICD-10-CM

## 2023-10-10 ENCOUNTER — Ambulatory Visit
Admission: RE | Admit: 2023-10-10 | Discharge: 2023-10-10 | Disposition: A | Discharge status: Home / Self Care | Payer: Medicare PPO | Source: Ambulatory Visit | Attending: Family Medicine | Admitting: Family Medicine

## 2023-10-10 DIAGNOSIS — Z1231 Encounter for screening mammogram for malignant neoplasm of breast: Secondary | ICD-10-CM | POA: Insufficient documentation

## 2023-11-19 ENCOUNTER — Other Ambulatory Visit: Payer: Self-pay | Admitting: Physician Assistant

## 2024-01-19 DIAGNOSIS — N1831 Chronic kidney disease, stage 3a: Secondary | ICD-10-CM | POA: Diagnosis not present

## 2024-01-19 DIAGNOSIS — I10 Essential (primary) hypertension: Secondary | ICD-10-CM | POA: Diagnosis not present

## 2024-01-19 DIAGNOSIS — R809 Proteinuria, unspecified: Secondary | ICD-10-CM | POA: Diagnosis not present

## 2024-01-22 DIAGNOSIS — R809 Proteinuria, unspecified: Secondary | ICD-10-CM | POA: Diagnosis not present

## 2024-01-22 DIAGNOSIS — N1831 Chronic kidney disease, stage 3a: Secondary | ICD-10-CM | POA: Diagnosis not present

## 2024-01-22 DIAGNOSIS — I10 Essential (primary) hypertension: Secondary | ICD-10-CM | POA: Diagnosis not present

## 2024-01-22 DIAGNOSIS — D631 Anemia in chronic kidney disease: Secondary | ICD-10-CM | POA: Diagnosis not present

## 2024-01-22 DIAGNOSIS — N2581 Secondary hyperparathyroidism of renal origin: Secondary | ICD-10-CM | POA: Diagnosis not present

## 2024-01-30 ENCOUNTER — Encounter: Payer: Self-pay | Admitting: *Deleted

## 2024-01-30 ENCOUNTER — Ambulatory Visit

## 2024-01-30 VITALS — Ht 59.0 in | Wt 144.0 lb

## 2024-01-30 DIAGNOSIS — H9190 Unspecified hearing loss, unspecified ear: Secondary | ICD-10-CM | POA: Diagnosis not present

## 2024-01-30 DIAGNOSIS — Z0000 Encounter for general adult medical examination without abnormal findings: Secondary | ICD-10-CM

## 2024-01-30 DIAGNOSIS — E785 Hyperlipidemia, unspecified: Secondary | ICD-10-CM | POA: Diagnosis not present

## 2024-01-30 DIAGNOSIS — E538 Deficiency of other specified B group vitamins: Secondary | ICD-10-CM | POA: Diagnosis not present

## 2024-01-30 NOTE — Telephone Encounter (Signed)
-----   Message from Gerry Krone sent at 01/30/2024  3:31 PM EDT ----- Regarding: Lab order for Mon, 5.19.25 Patient is scheduled for CPX labs, please order future labs, Thanks , Anselmo Kings

## 2024-01-30 NOTE — Telephone Encounter (Signed)
 This encounter was created in error - please disregard.

## 2024-01-30 NOTE — Patient Instructions (Signed)
 Natasha Sawyer , Thank you for taking time out of your busy schedule to complete your Annual Wellness Visit with me. I enjoyed our conversation and look forward to speaking with you again next year. I, as well as your care team,  appreciate your ongoing commitment to your health goals. Please review the following plan we discussed and let me know if I can assist you in the future. Your Game plan/ To Do List     Referrals: Patient complains of difficulty with hearing. ENT referral placed. Patient is in agreement with treatment plan. Aware that the office will call with an appointment.    Follow up Visits: Next Medicare AWV with our clinical staff: 02/01/2024 @ 8:50am   Have you seen your provider in the last 6 months (3 months if uncontrolled diabetes)? Yes Next Office Visit with your provider: 02/06/24  Clinician Recommendations:  30 minutes of exercise or brisk walking, pt says she is unable to do at this time due to SOB and exertion issues. Encouraged to drink 6-8 glasses of water, and 5 servings of fruits and vegetables each day.       This is a list of the screening recommended for you and due dates:  Health Maintenance  Topic Date Due   DTaP/Tdap/Td vaccine (2 - Td or Tdap) 02/11/2021   COVID-19 Vaccine (6 - 2024-25 season) 05/18/2023   Flu Shot  04/16/2024   Mammogram  10/09/2024   Medicare Annual Wellness Visit  01/29/2025   DEXA scan (bone density measurement)  04/24/2027   Pneumonia Vaccine  Completed   Zoster (Shingles) Vaccine  Completed   HPV Vaccine  Aged Out   Meningitis B Vaccine  Aged Out   Hepatitis C Screening  Discontinued    Advanced directives: (In Chart) A copy of your advanced directives are scanned into your chart should your provider ever need it. Advance Care Planning is important because it:  [x]  Makes sure you receive the medical care that is consistent with your values, goals, and preferences  [x]  It provides guidance to your family and loved ones and  reduces their decisional burden about whether or not they are making the right decisions based on your wishes.  Follow the link provided in your after visit summary or read over the paperwork we have mailed to you to help you started getting your Advance Directives in place. If you need assistance in completing these, please reach out to us  so that we can help you!

## 2024-01-30 NOTE — Addendum Note (Signed)
 Addended byHerby Lolling E on: 01/30/2024 01:17 PM   Modules accepted: Orders

## 2024-01-30 NOTE — Progress Notes (Signed)
 Subjective:   Natasha Sawyer is a 83 y.o. who presents for a Medicare Wellness preventive visit.  As a reminder, Annual Wellness Visits don't include a physical exam, and some assessments may be limited, especially if this visit is performed virtually. We may recommend an in-person visit if needed.  Visit Complete: Virtual I connected with  Natasha Sawyer on 01/30/24 by a audio enabled telemedicine application and verified that I am speaking with the correct person using two identifiers.  Patient Location: Home  Provider Location: Office/Clinic  I discussed the limitations of evaluation and management by telemedicine. The patient expressed understanding and agreed to proceed.  Vital Signs: Because this visit was a virtual/telehealth visit, some criteria may be missing or patient reported. Any vitals not documented were not able to be obtained and vitals that have been documented are patient reported.  VideoDeclined- This patient declined Librarian, academic. Therefore the visit was completed with audio only.  Persons Participating in Visit: Patient.  AWV Questionnaire: No: Patient Medicare AWV questionnaire was not completed prior to this visit.  Cardiac Risk Factors include: advanced age (>51men, >40 women);dyslipidemia;hypertension;sedentary lifestyle     Objective:     Today's Vitals   01/30/24 0854  Weight: 144 lb (65.3 kg)  Height: 4\' 11"  (1.499 m)   Body mass index is 29.08 kg/m.     01/30/2024    9:06 AM 01/02/2023    1:45 PM 12/05/2022    3:02 PM 12/20/2021   10:57 AM 12/19/2021    1:48 PM 12/10/2021   12:57 PM 11/08/2021    8:22 AM  Advanced Directives  Does Patient Have a Medical Advance Directive? Yes Yes Yes Yes Yes No Yes  Type of Estate agent of [place];Living will Healthcare Power of [employer] of [place];Living will Healthcare Power of [place];Living will Living will;Healthcare Power of [employer] Power of [place];Living will  Does patient want to make changes to medical advance directive?   No - Patient declined  No - Guardian declined  Yes (MAU/Ambulatory/Procedural Areas - Information given)  Copy of Healthcare Power of Attorney in Chart? Yes - validated most recent copy scanned in chart (See row information)  Yes - validated most recent copy scanned in chart (See row information) No - copy requested No - copy requested  Yes - validated most recent copy scanned in chart (See row information)    Current Medications (verified) Outpatient Encounter Medications as of 01/30/2024  Medication Sig   acetaminophen  (TYLENOL ) 650 MG CR tablet Take 650 mg by mouth every 8 (eight) hours as needed for pain.   amLODipine  (NORVASC ) 5 MG tablet TAKE 1 TABLET EVERY DAY   Cholecalciferol  (VITAMIN D3) 50 MCG (2000 UT) TABS Take 2,000 Units by mouth in the morning.   Cyanocobalamin  (B-12 PO) Take 1,000 mcg by mouth in the morning.   enalapril  (VASOTEC ) 20 MG tablet Take 20 mg by mouth in the morning.   ezetimibe -simvastatin  (VYTORIN ) 10-20 MG tablet Take 1 tablet by mouth in the morning.   Ferrous Sulfate (IRON PO) Take 1 tablet by mouth daily.   propranolol  (INDERAL ) 10 MG tablet Take 1 tablet (10 mg total) by mouth daily as needed (for palpitations or a heart rate greater than 130.).   zolpidem  (AMBIEN ) 5 MG tablet Take 2.5 mg by mouth at bedtime as needed for sleep.   No facility-administered encounter medications on file as of 01/30/2024.    Allergies (verified) Patient has no  known allergies.   History: Past Medical History:  Diagnosis Date   Arthritis    Ascending aorta dilation (HCC)    Chronic kidney disease    Dysrhythmia    Hyperlipidemia    Hypertension    Palpitations    Premature beats    Past Surgical History:  Procedure Laterality Date   APPENDECTOMY     BICEPT TENODESIS Left 12/20/2021   Procedure: Left reverse shoulder arthroplasty for proximal humerus fracture,  biceps tenodesis;  Surgeon: Lorri Rota, MD;  Location: [facility] ORS;  Service: Orthopedics;  Laterality: Left;   CESAREAN SECTION     x3   EYE SURGERY     cataract and eyelid   HERNIA REPAIR     REVERSE SHOULDER ARTHROPLASTY Left 12/20/2021   Procedure: Left reverse shoulder arthroplasty for proximal humerus fracture, biceps tenodesis;  Surgeon: Lorri Rota, MD;  Location: [facility] ORS;  Service: Orthopedics;  Laterality: Left;   Family History  Problem Relation Age of Onset   Diabetes Mother    Stroke Father    Breast cancer Cousin 65       maternal   Social History   Socioeconomic History   Marital status: Widowed    Spouse name: Not on file   Number of children: 3   Years of education: Not on file   Highest education level: Not on file  Occupational History   Not on file  Tobacco Use   Smoking status: Never   Smokeless tobacco: Never  Vaping Use   Vaping status: Never Used  Substance and Sexual Activity   Alcohol use: No   Drug use: No   Sexual activity: Not Currently  Other Topics Concern   Not on file  Social History Narrative   Lives [facility]    HAs living will, full code   Social Drivers of Health   Financial Resource Strain: Low Risk  (01/30/2024)   Overall Financial Resource Strain (CARDIA)    Difficulty of Paying Living Expenses: Not hard at all  Food Insecurity: No Food Insecurity (01/30/2024)   Hunger Vital Sign    Worried About Running Out of Food in the Last Year: Never true    Ran Out of Food in the Last Year: Never true  Transportation Needs: No Transportation Needs (01/30/2024)   PRAPARE - Administrator, Civil Service (Medical): No    Lack of Transportation (Non-Medical): No  Physical Activity: Inactive (01/30/2024)   Exercise Vital Sign    Days of Exercise per Week: 0 days    Minutes of Exercise per Session: 0 min  Stress: No Stress Concern Present (01/30/2024)   [employer] of Occupational Health - Occupational Stress  Questionnaire    Feeling of Stress : Not at all  Social Connections: Moderately Isolated (01/30/2024)   Social Connection and Isolation Panel [NHANES]    Frequency of Communication with Friends and Family: More than three times a week    Frequency of Social Gatherings with Friends and Family: More than three times a week    Attends Religious Services: More than 4 times per year    Active Member of [employer] or Organizations: No    Attends Banker Meetings: Never    Marital Status: Widowed    Tobacco Counseling Counseling given: Not Answered    Clinical Intake:  Pre-visit preparation completed: Yes  Pain : No/denies pain     BMI - recorded: 29.08 Nutritional Status: BMI 25 -29 Overweight Nutritional Risks: None Diabetes:  No  Lab Results  Component Value Date   HGBA1C 6.0 06/25/2018     How often do you need to have someone help you when you read instructions, pamphlets, or other written materials from your doctor or pharmacy?: 1 - Never  Interpreter Needed?: No  Comments: lives alone Information entered by :: B.Shellyann Wandrey,LPN   Activities of Daily Living     01/30/2024    9:06 AM  In your present state of health, do you have any difficulty performing the following activities:  Hearing? 1  Vision? 0  Difficulty concentrating or making decisions? 0  Walking or climbing stairs? 1  Dressing or bathing? 0  Doing errands, shopping? 0  Preparing Food and eating ? N  Using the Toilet? N  In the past six months, have you accidently leaked urine? N  Do you have problems with loss of bowel control? N  Managing your Medications? N  Managing your Finances? N  Housekeeping or managing your Housekeeping? N    Patient Care Team: Judithann Novas, MD as PCP - General Wenona Hamilton, MD as PCP - Cardiology (Cardiology) Lateef, Munsoor, MD as Consulting Physician (Internal Medicine) Green, Davina E, RN as [facility] Care Management  Indicate any  recent Medical Services you may have received from other than [facility] providers in the past year (date may be approximate).     Assessment:    This is a routine wellness examination for Natasha Sawyer.  Hearing/Vision screen Hearing Screening - Comments:: Pt says her hearing is not good;wants hearing test Referral to Audiology Vision Screening - Comments:: Pt says her vision is good;readers only   Goals Addressed             This Visit's Progress    COMPLETED: Patient Stated       06/28/2019, Patient wants to work on losing some weight soon.      Patient Stated       01/30/24-Would like to lose weight if I can       Depression Screen     01/30/2024    9:03 AM 12/05/2022    3:02 PM 11/08/2021    8:24 AM 07/12/2020   12:29 PM 07/04/2020   11:55 AM 06/28/2019   11:23 AM 06/25/2018   10:58 AM  PHQ 2/9 Scores  PHQ - 2 Score 0 0 0 0 0 0 0  PHQ- 9 Score    0 2 0 0    Fall Risk     01/30/2024    8:58 AM 12/05/2022    3:03 PM 11/08/2021    8:23 AM 07/12/2020   12:29 PM 07/04/2020   11:49 AM  Fall Risk   Falls in the past year? 1 1 0 0 0  Comment  Tripped     Number falls in past yr: 0 0 0 0 0  Injury with Fall? 0 1 0 0 0  Comment  Left shoulder - Followed by Orthopedic     Risk for fall due to : No Fall Risks No Fall Risks No Fall Risks No Fall Risks   Follow up Education provided;Falls prevention discussed Falls evaluation completed;Falls prevention discussed;Education provided Falls prevention discussed Falls evaluation completed;Falls prevention discussed Falls evaluation completed    MEDICARE RISK AT HOME:  Medicare Risk at Home Any stairs in or around the home?: Yes If so, are there any without handrails?: Yes Home free of loose throw rugs in walkways, pet beds, electrical cords, etc?: Yes Adequate lighting in  your home to reduce risk of falls?: Yes Life alert?: No Use of a cane, walker or w/c?: No Grab bars in the bathroom?: Yes Shower chair or bench in shower?:  No Elevated toilet seat or a handicapped toilet?: Yes  TIMED UP AND GO:  Was the test performed?  No  Cognitive Function: 6CIT completed    07/12/2020   12:30 PM 06/28/2019   11:26 AM 06/25/2018   10:58 AM 06/16/2017    9:02 AM 06/12/2016    9:40 AM  MMSE - Mini Mental State Exam  Orientation to time 5 5 5 5 5   Orientation to Place 5 5 5 5 5   Registration 3 3 3 3 3   Attention/ Calculation 5 5 0 0 0  Recall 3 3 3 3 3   Language- name 2 objects   0 0 0  Language- repeat 1 1 1 1 1   Language- follow 3 step command   3 3 3   Language- read & follow direction   0 0 0  Write a sentence   0 0 0  Copy design   0 0 0  Total score   20 20 20         01/30/2024    9:07 AM 12/05/2022    3:07 PM  6CIT Screen  What Year? 0 points 0 points  What month? 0 points 0 points  What time? 0 points 0 points  Count back from 20 0 points 0 points  Months in reverse 0 points 0 points  Repeat phrase 0 points 0 points  Total Score 0 points 0 points    Immunizations Immunization History  Administered Date(s) Administered   Fluad Quad(high Dose 65+) 06/14/2020   Influenza, High Dose Seasonal PF 07/06/2021   Influenza,inj,Quad PF,6+ Mos 06/25/2018, 07/02/2019   Influenza-Unspecified 06/16/2013   Moderna Sars-Covid-2 Vaccination 10/01/2019, 10/29/2019, 08/01/2020, 02/01/2021   PPD Test 12/24/2021   Pfizer Covid-19 Vaccine Bivalent Booster 57yrs & up 06/07/2021   Pneumococcal Conjugate-13 06/25/2018   Pneumococcal Polysaccharide-23 07/02/2019   Tdap 02/12/2011   Zoster Recombinant(Shingrix) 11/26/2019, 01/26/2020   Zoster, Live 02/13/2012    Screening Tests Health Maintenance  Topic Date Due   DTaP/Tdap/Td (2 - Td or Tdap) 02/11/2021   COVID-19 Vaccine (6 - 2024-25 season) 05/18/2023   INFLUENZA VACCINE  04/16/2024   MAMMOGRAM  10/09/2024   Medicare Annual Wellness (AWV)  01/29/2025   DEXA SCAN  04/24/2027   Pneumonia Vaccine 58+ Years old  Completed   Zoster Vaccines- Shingrix   Completed   HPV VACCINES  Aged Out   Meningococcal B Vaccine  Aged Out   Hepatitis C Screening  Discontinued    Health Maintenance  Health Maintenance Due  Topic Date Due   DTaP/Tdap/Td (2 - Td or Tdap) 02/11/2021   COVID-19 Vaccine (6 - 2024-25 season) 05/18/2023   Health Maintenance Items Addressed: None needed at this time  Additional Screening:  Vision Screening: Recommended annual ophthalmology exams for early detection of glaucoma and other disorders of the eye.  Dental Screening: Recommended annual dental exams for proper oral hygiene  Community Resource Referral / Chronic Care Management: CRR required this visit?  No   CCM required this visit?  Appt scheduled with PCP   Plan:    I have personally reviewed and noted the following in the patient's chart:   Medical and social history Use of alcohol, tobacco or illicit drugs  Current medications and supplements including opioid prescriptions. Patient is not currently taking opioid prescriptions. Functional ability  and status Nutritional status Physical activity Advanced directives List of other physicians Hospitalizations, surgeries, and ER visits in previous 12 months Vitals Screenings to include cognitive, depression, and falls Referrals and appointments  In addition, I have reviewed and discussed with patient certain preventive protocols, quality metrics, and best practice recommendations. A written personalized care plan for preventive services as well as general preventive health recommendations were provided to patient.   Nerissa Bannister, LPN   [id number]/[id number]   After Visit Summary: (MyChart) Due to this being a telephonic visit, the after visit summary with patients personalized plan was offered to patient via MyChart   Notes: Please refer to Routing Comments.

## 2024-02-02 ENCOUNTER — Other Ambulatory Visit (INDEPENDENT_AMBULATORY_CARE_PROVIDER_SITE_OTHER)

## 2024-02-02 DIAGNOSIS — E538 Deficiency of other specified B group vitamins: Secondary | ICD-10-CM | POA: Diagnosis not present

## 2024-02-02 DIAGNOSIS — E785 Hyperlipidemia, unspecified: Secondary | ICD-10-CM | POA: Diagnosis not present

## 2024-02-02 LAB — COMPREHENSIVE METABOLIC PANEL WITH GFR
ALT: 6 U/L (ref 0–35)
AST: 10 U/L (ref 0–37)
Albumin: 3.6 g/dL (ref 3.5–5.2)
Alkaline Phosphatase: 78 U/L (ref 39–117)
BUN: 27 mg/dL — ABNORMAL HIGH (ref 6–23)
CO2: 27 meq/L (ref 19–32)
Calcium: 9.1 mg/dL (ref 8.4–10.5)
Chloride: 105 meq/L (ref 96–112)
Creatinine, Ser: 1.3 mg/dL — ABNORMAL HIGH (ref 0.40–1.20)
GFR: 38.65 mL/min — ABNORMAL LOW (ref >60.00)
Glucose, Bld: 117 mg/dL — ABNORMAL HIGH (ref 70–99)
Potassium: 4.8 meq/L (ref 3.5–5.1)
Sodium: 140 meq/L (ref 135–145)
Total Bilirubin: 0.3 mg/dL (ref 0.2–1.2)
Total Protein: 6.4 g/dL (ref 6.0–8.3)

## 2024-02-02 LAB — LIPID PANEL
Cholesterol: 139 mg/dL (ref 0–200)
HDL: 66.7 mg/dL (ref >39.00)
LDL Cholesterol: 57 mg/dL (ref 0–99)
NonHDL: 72.7
Total CHOL/HDL Ratio: 2
Triglycerides: 79 mg/dL (ref 0.0–149.0)
VLDL: 15.8 mg/dL (ref 0.0–40.0)

## 2024-02-03 ENCOUNTER — Other Ambulatory Visit

## 2024-02-03 ENCOUNTER — Ambulatory Visit: Payer: Self-pay | Admitting: Family Medicine

## 2024-02-03 LAB — VITAMIN B12: Vitamin B-12: 1272 pg/mL — ABNORMAL HIGH (ref 211–911)

## 2024-02-03 NOTE — Progress Notes (Signed)
 No critical labs need to be addressed urgently. We will discuss labs in detail at upcoming office visit.

## 2024-02-06 ENCOUNTER — Encounter: Payer: Self-pay | Admitting: Family Medicine

## 2024-02-06 ENCOUNTER — Ambulatory Visit (INDEPENDENT_AMBULATORY_CARE_PROVIDER_SITE_OTHER): Admitting: Family Medicine

## 2024-02-06 VITALS — BP 138/72 | HR 51 | Temp 98.2°F | Ht 59.0 in | Wt 144.1 lb

## 2024-02-06 DIAGNOSIS — N2581 Secondary hyperparathyroidism of renal origin: Secondary | ICD-10-CM

## 2024-02-06 DIAGNOSIS — I10 Essential (primary) hypertension: Secondary | ICD-10-CM

## 2024-02-06 DIAGNOSIS — N1832 Chronic kidney disease, stage 3b: Secondary | ICD-10-CM | POA: Diagnosis not present

## 2024-02-06 DIAGNOSIS — Z0000 Encounter for general adult medical examination without abnormal findings: Secondary | ICD-10-CM | POA: Diagnosis not present

## 2024-02-06 DIAGNOSIS — I471 Supraventricular tachycardia, unspecified: Secondary | ICD-10-CM | POA: Diagnosis not present

## 2024-02-06 DIAGNOSIS — E785 Hyperlipidemia, unspecified: Secondary | ICD-10-CM

## 2024-02-06 NOTE — Progress Notes (Signed)
 Patient ID: Natasha Sawyer, female    DOB: Aug 21, 1941, 83 y.o.   MRN: [id number]  This visit was conducted in person.  BP 138/72   Pulse (!) 51   Temp 98.2 F (36.8 C) (Temporal)   Ht 4\' 11"  (1.499 m)   Wt 144 lb 2 oz (65.4 kg)   SpO2 97%   BMI 29.11 kg/m    CC: Chief Complaint  Patient presents with   Annual Exam    Part 2 ([facility] 01/30/24)    Subjective:   HPI: Natasha Sawyer is a 83 y.o. female presenting on 02/06/2024 for Annual Exam (Part 2 ([facility] 01/30/24))  The patient presents for  complete physical and review of chronic health problems. He/She also has the following acute concerns today: none  The patient saw a LPN or RN for medicare wellness visit. 01/30/2024  Prevention and wellness was reviewed in detail. Note reviewed.   Hypertension:  Well controlled on amlodipine  5 mg daily,  enalapril  20 mg daily. Has inderal  but has not used in years BP Readings from Last 3 Encounters:  02/06/24 138/72  08/19/23 (!) 140/80  03/03/23 128/70  Using medication without problems or lightheadedness:  none Chest pain with exertion: none Edema: intermittent Short of breath:  some SOB with exertion for a while. Average home BPs: Other issues:    CKD stage 3  and secondary hyperparathyroid followed by Dr. Denita Fiscal Creatinine Clearance: 27.9 mL/min (A) (by C-G formula based on SCr of 1.3 mg/dL (H)). On ACEI, BP controlled.   Hyperkalemia resolved  B12 deficiency:At goal on  oral replacement  Elevated Cholesterol:LDL at goal  < 70 on Vytorin  10/20 mg daily Lab Results  Component Value Date   CHOL 139 02/02/2024   HDL 66.70 02/02/2024   LDLCALC 57 02/02/2024   TRIG 79.0 02/02/2024   CHOLHDL 2 02/02/2024  Using medications without problems: none Muscle aches:  none Diet compliance: moderate Exercise: walking  daily to every other day.  Other complaints:    PSVT, stable .Aaron Aas No longer using inderal  prn,  on amlodipine  Followed by Varney Gentleman PA in past.   No CP, no  lightheadedness  Relevant past medical, surgical, family and social history reviewed and updated as indicated. Interim medical history since our last visit reviewed. Allergies and medications reviewed and updated. Outpatient Medications Prior to Visit  Medication Sig Dispense Refill   acetaminophen  (TYLENOL ) 650 MG CR tablet Take 650 mg by mouth every 8 (eight) hours as needed for pain.     amLODipine  (NORVASC ) 5 MG tablet TAKE 1 TABLET EVERY DAY 90 tablet 3   Cholecalciferol  (VITAMIN D3) 50 MCG (2000 UT) TABS Take 2,000 Units by mouth in the morning.     Cyanocobalamin  (B-12 PO) Take 1,000 mcg by mouth in the morning.     enalapril  (VASOTEC ) 20 MG tablet Take 20 mg by mouth in the morning.     ezetimibe -simvastatin  (VYTORIN ) 10-20 MG tablet Take 1 tablet by mouth in the morning. 90 tablet 3   Ferrous Sulfate (IRON PO) Take 1 tablet by mouth daily.     zolpidem  (AMBIEN ) 5 MG tablet Take 2.5 mg by mouth at bedtime as needed for sleep.     propranolol  (INDERAL ) 10 MG tablet Take 1 tablet (10 mg total) by mouth daily as needed (for palpitations or a heart rate greater than 130.). 90 tablet 3   No facility-administered medications prior to visit.     Per HPI unless specifically indicated in  ROS section below Review of Systems  Constitutional:  Negative for fatigue, fever and unexpected weight change.  HENT:  Negative for congestion, ear pain, sinus pressure, sneezing, sore throat and trouble swallowing.   Eyes:  Negative for pain and itching.  Respiratory:  Negative for cough, shortness of breath and wheezing.   Cardiovascular:  Negative for chest pain, palpitations and leg swelling.  Gastrointestinal:  Negative for abdominal pain, blood in stool, constipation, diarrhea and nausea.  Genitourinary:  Negative for difficulty urinating, dysuria, hematuria, menstrual problem and vaginal discharge.  Skin:  Negative for rash.  Neurological:  Negative for syncope, weakness, light-headedness,  numbness and headaches.  Psychiatric/Behavioral:  Negative for confusion and dysphoric mood. The patient is not nervous/anxious.    Objective:  BP 138/72   Pulse (!) 51   Temp 98.2 F (36.8 C) (Temporal)   Ht 4\' 11"  (1.499 m)   Wt 144 lb 2 oz (65.4 kg)   SpO2 97%   BMI 29.11 kg/m   Wt Readings from Last 3 Encounters:  02/06/24 144 lb 2 oz (65.4 kg)  01/30/24 144 lb (65.3 kg)  08/19/23 147 lb 6 oz (66.8 kg)      Physical Exam Vitals and nursing note reviewed.  Constitutional:      General: She is not in acute distress.    Appearance: Normal appearance. She is well-developed. She is obese. She is not ill-appearing or toxic-appearing.  HENT:     Head: Normocephalic.     Right Ear: Hearing, tympanic membrane, ear canal and external ear normal. Tympanic membrane is not erythematous, retracted or bulging.     Left Ear: Hearing, tympanic membrane, ear canal and external ear normal. Tympanic membrane is not erythematous, retracted or bulging.     Nose: Nose normal. No mucosal edema or rhinorrhea.     Right Sinus: No maxillary sinus tenderness or frontal sinus tenderness.     Left Sinus: No maxillary sinus tenderness or frontal sinus tenderness.     Mouth/Throat:     Pharynx: Uvula midline.  Eyes:     General: Lids are normal. Lids are everted, no foreign bodies appreciated.     Conjunctiva/sclera: Conjunctivae normal.     Pupils: Pupils are equal, round, and reactive to light.  Neck:     Thyroid : No thyroid  mass or thyromegaly.     Vascular: No carotid bruit.     Trachea: Trachea normal.  Cardiovascular:     Rate and Rhythm: Normal rate and regular rhythm.     Pulses: Normal pulses.     Heart sounds: Normal heart sounds, S1 normal and S2 normal. No murmur heard.    No friction rub. No gallop.  Pulmonary:     Effort: Pulmonary effort is normal. No tachypnea or respiratory distress.     Breath sounds: Normal breath sounds. No decreased breath sounds, wheezing, rhonchi or rales.   Abdominal:     General: Bowel sounds are normal. There is no distension or abdominal bruit.     Palpations: Abdomen is soft. There is no fluid wave or mass.     Tenderness: There is no abdominal tenderness. There is no guarding or rebound.     Hernia: No hernia is present.  Musculoskeletal:     Cervical back: Normal range of motion and neck supple.  Lymphadenopathy:     Cervical: No cervical adenopathy.  Skin:    General: Skin is warm and dry.     Findings: No rash.  Neurological:  Mental Status: She is alert and oriented to person, place, and time.     GCS: GCS eye subscore is 4. GCS verbal subscore is 5. GCS motor subscore is 6.     Cranial Nerves: No cranial nerve deficit.     Sensory: No sensory deficit.     Motor: No abnormal muscle tone.     Coordination: Coordination normal.     Gait: Gait normal.     Deep Tendon Reflexes: Reflexes are normal and symmetric.     Comments: Nml cerebellar exam   No papilledema  Psychiatric:        Mood and Affect: Mood is not anxious or depressed.        Speech: Speech normal.        Behavior: Behavior normal. Behavior is cooperative.        Thought Content: Thought content normal.        Cognition and Memory: Memory is not impaired. She does not exhibit impaired recent memory or impaired remote memory.        Judgment: Judgment normal.       Results for orders placed or performed in visit on 02/02/24  Vitamin B12   Collection Time: 02/02/24  9:21 AM  Result Value Ref Range   Vitamin B-12 1,272 (H) 211 - 911 pg/mL  Comprehensive metabolic panel with GFR   Collection Time: 02/02/24  9:21 AM  Result Value Ref Range   Sodium 140 135 - 145 mEq/L   Potassium 4.8 3.5 - 5.1 mEq/L   Chloride 105 96 - 112 mEq/L   CO2 27 19 - 32 mEq/L   Glucose, Bld 117 (H) 70 - 99 mg/dL   BUN 27 (H) 6 - 23 mg/dL   Creatinine, Ser 1.61 (H) 0.40 - 1.20 mg/dL   Total Bilirubin 0.3 0.2 - 1.2 mg/dL   Alkaline Phosphatase 78 39 - 117 U/L   AST 10 0 - 37  U/L   ALT 6 0 - 35 U/L   Total Protein 6.4 6.0 - 8.3 g/dL   Albumin 3.6 3.5 - 5.2 g/dL   GFR 09.60 (L) >45.40 mL/min   Calcium 9.1 8.4 - 10.5 mg/dL  Lipid panel   Collection Time: 02/02/24  9:21 AM  Result Value Ref Range   Cholesterol 139 0 - 200 mg/dL   Triglycerides 98.1 0.0 - 149.0 mg/dL   HDL 19.14 >78.29 mg/dL   VLDL 56.2 0.0 - 13.0 mg/dL   LDL Cholesterol 57 0 - 99 mg/dL   Total CHOL/HDL Ratio 2    NonHDL 72.70     This visit occurred during the SARS-CoV-2 public health emergency.  Safety protocols were in place, including screening questions prior to the visit, additional usage of staff PPE, and extensive cleaning of exam room while observing appropriate contact time as indicated for disinfecting solutions.   COVID 19 screen:  No recent travel or known exposure to COVID19 The patient denies respiratory symptoms of COVID 19 at this time. The importance of social distancing was discussed today.   Assessment and Plan The patient's preventative maintenance and recommended screening tests for an annual wellness exam were reviewed in full today. Brought up to date unless services declined.  Counselled on the importance of diet, exercise, and its role in overall health and mortality. The patient's FH and SH was reviewed, including their home life, tobacco status, and drug and alcohol status.   Mammo:10/10/2023 normal  PAP/DVE:  last pap 2012 nml,  no longer indicated due  to age, asymptomatic, no family history of ovarian or uterine cancer: Colon: last done many years ago 2006 : nml cologuard. No further needed at this time. Nonsmoker Vaccines: COVID , flu uptodate,S/P shingrix and postpone tetanus unless injury Bone density  osteopenia, 04/2022. Osteopenia T-1.7  Hep C: done   Problem List Items Addressed This Visit     CKD (chronic kidney disease) stage 3, GFR 30-59 ml/min (HCC) (Chronic)   Chronic, stable  Followed by Dr. Rhesa Celeste.  On ACEI  BP controlled.       Essential hypertension, benign (Chronic)   Stable, chronic.  Continue current medication.   amlodipine  10 mg daily, inderal  10 mg daily, enalapril  20 mg daily.      Hyperlipidemia (Chronic)   Chronic  LDL at goal on Vytorin  10/20 mg daily      PSVT (paroxysmal supraventricular tachycardia) (HCC) (Chronic)   Chronic  Stable not requiring propranolol  in years,  followed by Cardiology. Varney Gentleman, PA      Secondary hyperparathyroidism of renal origin [facility]) (Chronic)   Followed by Dr. Rhesa Celeste, nephrology.       Other Visit Diagnoses       Routine general medical examination at a health care facility    -  Primary         Herby Lolling, MD

## 2024-02-06 NOTE — Assessment & Plan Note (Signed)
Chronic, stable  Followed by Dr. Cherylann Ratel.  On ACEI  BP controlled.

## 2024-02-06 NOTE — Assessment & Plan Note (Signed)
Followed by Dr. Lateef, nephrology. 

## 2024-02-06 NOTE — Assessment & Plan Note (Signed)
Chronic ? ?LDL at goal on Vytorin 10/20 mg daily ?

## 2024-02-06 NOTE — Assessment & Plan Note (Addendum)
 Chronic  Stable not requiring propranolol  in years,  followed by Cardiology. Varney Gentleman, PA

## 2024-02-06 NOTE — Assessment & Plan Note (Signed)
Stable, chronic.  Continue current medication. ? ? ?amlodipine 10 mg daily, inderal 10 mg daily, enalapril 20 mg daily. ?

## 2024-02-26 ENCOUNTER — Ambulatory Visit: Admitting: Audiologist

## 2024-04-02 DIAGNOSIS — H52223 Regular astigmatism, bilateral: Secondary | ICD-10-CM | POA: Diagnosis not present

## 2024-04-02 DIAGNOSIS — Z9842 Cataract extraction status, left eye: Secondary | ICD-10-CM | POA: Diagnosis not present

## 2024-04-02 DIAGNOSIS — Z9841 Cataract extraction status, right eye: Secondary | ICD-10-CM | POA: Diagnosis not present

## 2024-04-15 ENCOUNTER — Encounter: Payer: Self-pay | Admitting: Physician Assistant

## 2024-04-15 ENCOUNTER — Ambulatory Visit: Attending: Physician Assistant | Admitting: Physician Assistant

## 2024-04-15 VITALS — BP 118/81 | HR 45 | Ht 59.0 in | Wt 144.4 lb

## 2024-04-15 DIAGNOSIS — R001 Bradycardia, unspecified: Secondary | ICD-10-CM | POA: Diagnosis not present

## 2024-04-15 DIAGNOSIS — I10 Essential (primary) hypertension: Secondary | ICD-10-CM | POA: Diagnosis not present

## 2024-04-15 DIAGNOSIS — E785 Hyperlipidemia, unspecified: Secondary | ICD-10-CM | POA: Diagnosis not present

## 2024-04-15 DIAGNOSIS — N1831 Chronic kidney disease, stage 3a: Secondary | ICD-10-CM | POA: Diagnosis not present

## 2024-04-15 DIAGNOSIS — I471 Supraventricular tachycardia, unspecified: Secondary | ICD-10-CM | POA: Diagnosis not present

## 2024-04-15 NOTE — Patient Instructions (Signed)
 Medication Instructions:  Your physician recommends that you continue on your current medications as directed. Please refer to the Current Medication list given to you today.   *If you need a refill on your cardiac medications before your next appointment, please call your pharmacy*  Lab Work: None ordered at this time  If you have labs (blood work) drawn today and your tests are completely normal, you will receive your results only by: MyChart Message (if you have MyChart) OR A paper copy in the mail If you have any lab test that is abnormal or we need to change your treatment, we will call you to review the results.  Follow-Up: At [facility], you and your health needs are our priority.  As part of our continuing mission to provide you with exceptional heart care, our providers are all part of one team.  This team includes your primary Cardiologist (physician) and Advanced Practice Providers or APPs (Physician Assistants and Nurse Practitioners) who all work together to provide you with the care you need, when you need it.  Your next appointment:   6 month(s)  Provider:   You may see Deatrice Cage, MD or Bernardino Bring, PA-C  We recommend signing up for the patient portal called MyChart.  Sign up information is provided on this After Visit Summary.  MyChart is used to connect with patients for Virtual Visits (Telemedicine).  Patients are able to view lab/test results, encounter notes, upcoming appointments, etc.  Non-urgent messages can be sent to your provider as well.   To learn more about what you can do with MyChart, go to [email or web address].

## 2024-04-15 NOTE — Progress Notes (Signed)
 Cardiology Office Note    Date:  04/15/2024   ID:  Natasha Sawyer, DOB [birth year], MRN [id number]  PCP:  Avelina Greig BRAVO, MD  Cardiologist:  Deatrice Cage, MD  Electrophysiologist:  None   Chief Complaint: Follow-up  History of Present Illness:   Natasha Sawyer is a 83 y.o. female with history of PSVT, bradycardia, CKD stage IIIa followed by nephrology, anemia of chronic disease, HTN, and HLD who presents for follow-up of SVT and bradycardia.   She was evaluated in 2013 for palpitations, atypical chest pain, and SOB with treadmill stress test being normal. Echo showed a normal LVSF with no significant valvular abnormalities. 48-hour Holter monitor showed sinus rhythm with frequent PACs and no significant arrhythmia. She was seen in 2019 with recurrent palpitations. Zio patch showed sinus rhythm with a 4-beat run of WCT, 18 episodes of SVT with the longest lasting 16 beats, and occasional PACs and PVCs. Treadmill stress test in 08/2018 showed no evidence of ischemia. She was able to exercise for 3 minutes and 39 seconds. Echo in 09/2018, showed normal LVSF, mild mitral regurgitation, and normal PASP.  Lexiscan  Myoview  on 02/21/2020 showed no significant ischemia with an EF greater than 65% and was overall a low risk study.  Echo on 02/22/2020 showed an EF of 55 to 60%, no regional wall motion abnormalities, diastolic dysfunction, normal RV systolic function and normal RV ventricular cavity size, normal PASP, mild mitral regurgitation, and mildly dilated ascending aorta measuring 36 mm.     She was seen in the office on 12/31/2022 and remained without symptoms of angina or cardiac decompensation.  She reported an episode of lower extremity weakness with generalized fatigue and near syncope while ambulating in [place] on a cruise excursion.  Following this episode, she was without further symptoms.  She remained bradycardic with a heart rate of 49 bpm in the office with appropriate chronotropic competence  demonstrated while in the office.  Amlodipine  was decreased to 5 mg daily.  Zio patch was applied.   She was seen in the ED on 01/02/2023 reporting an episode of palpitations the night prior with a heart rate around 105 bpm.  She took a as needed propranolol  with symptomatic improvement and palpitations after approximately 20 minutes.  Upon waking the next morning, she felt fatigued with a heart rate in the low 40s bpm.  Heart rate in the ER ranged from 46 to 51 bpm.  She was mildly hyperkalemic with a potassium of 5.5, and was given a dose of Lokelma .  EKG showed sinus bradycardia.  High-sensitivity troponin was negative.  Heart rate maintained in the low 50s bpm, which is around her baseline, and she was discharged to outpatient follow-up.   Subsequent Zio patch demonstrated a predominant rhythm of sinus with an average rate of 50 bpm (range 38 to 144 bpm), first-degree AV block, 30 episodes of SVT with the fastest interval lasting 7 beats with a maximal rate of 144 bpm and the longest interval lasting 9 beats with an average rate of 102 bpm.  Rare PACs and PVCs.  The lowest heart rate of 38 bpm occurred at 3:31 AM.  Echo in 12/2022 demonstrated an EF of 60 to 65%, no regional wall motion abnormalities, grade 1 diastolic dysfunction, normal RV systolic function and ventricular cavity size, mild mitral regurgitation, and a normal CVP.  Due to bradycardia, she was not started on standing beta-blocker, though does state as needed propranolol .  She was last seen in the  office 08/2023 and was doing well with improved symptoms.  She was continued on as needed propranolol .  She comes in doing well from a cardiac perspective and remains without symptoms of angina or cardiac decompensation.  No palpitations since she was last seen.  She has not needed any as needed propranolol .  No dizziness, presyncope, or syncope.  She notes chronic stable fatigue.  No significant lower extremity swelling or progressive orthopnea.   Overall, she feels like she is doing well and does not have any acute cardiac concerns at this time.   Labs independently reviewed: 01/2024 - TC 139, TG 79, HDL 66, LDL 57, potassium 4.8, BUN 27, serum creatinine 1.3, albumin 3.6, AST/ALT normal, Hgb 10.9, PLT 284 12/2022 - TSH normal   Past Medical History:  Diagnosis Date   Arthritis    Ascending aorta dilation (HCC)    Chronic kidney disease    Dysrhythmia    Hyperlipidemia    Hypertension    Palpitations    Premature beats     Past Surgical History:  Procedure Laterality Date   APPENDECTOMY     BICEPT TENODESIS Left 12/20/2021   Procedure: Left reverse shoulder arthroplasty for proximal humerus fracture, biceps tenodesis;  Surgeon: Tobie Priest, MD;  Location: [facility] ORS;  Service: Orthopedics;  Laterality: Left;   CESAREAN SECTION     x3   EYE SURGERY     cataract and eyelid   HERNIA REPAIR     REVERSE SHOULDER ARTHROPLASTY Left 12/20/2021   Procedure: Left reverse shoulder arthroplasty for proximal humerus fracture, biceps tenodesis;  Surgeon: Tobie Priest, MD;  Location: [facility] ORS;  Service: Orthopedics;  Laterality: Left;    Current Medications: Current Meds  Medication Sig   acetaminophen  (TYLENOL ) 650 MG CR tablet Take 650 mg by mouth every 8 (eight) hours as needed for pain.   amLODipine  (NORVASC ) 5 MG tablet TAKE 1 TABLET EVERY DAY   Cholecalciferol  (VITAMIN D3) 50 MCG (2000 UT) TABS Take 2,000 Units by mouth in the morning.   Cyanocobalamin  (B-12 PO) Take 1,000 mcg by mouth in the morning.   enalapril  (VASOTEC ) 20 MG tablet Take 20 mg by mouth in the morning.   ezetimibe -simvastatin  (VYTORIN ) 10-20 MG tablet Take 1 tablet by mouth in the morning.   Ferrous Sulfate (IRON PO) Take 1 tablet by mouth daily.   zolpidem  (AMBIEN ) 5 MG tablet Take 2.5 mg by mouth at bedtime as needed for sleep.    Allergies:   Patient has no known allergies.   Social History   Socioeconomic History   Marital status: Widowed    Spouse  name: Not on file   Number of children: 3   Years of education: Not on file   Highest education level: Not on file  Occupational History   Not on file  Tobacco Use   Smoking status: Never   Smokeless tobacco: Never  Vaping Use   Vaping status: Never Used  Substance and Sexual Activity   Alcohol use: No   Drug use: No   Sexual activity: Not Currently  Other Topics Concern   Not on file  Social History Narrative   Lives at [facility]    HAs living will, full code   Social Drivers of Health   Financial Resource Strain: Low Risk  (01/30/2024)   Overall Financial Resource Strain (CARDIA)    Difficulty of Paying Living Expenses: Not hard at all  Food Insecurity: No Food Insecurity (01/30/2024)   Hunger Vital Sign  Worried About Programme researcher, broadcasting/film/video in the Last Year: Never true    Ran Out of Food in the Last Year: Never true  Transportation Needs: No Transportation Needs (01/30/2024)   PRAPARE - Administrator, Civil Service (Medical): No    Lack of Transportation (Non-Medical): No  Physical Activity: Inactive (01/30/2024)   Exercise Vital Sign    Days of Exercise per Week: 0 days    Minutes of Exercise per Session: 0 min  Stress: No Stress Concern Present (01/30/2024)   [employer] of Occupational Health - Occupational Stress Questionnaire    Feeling of Stress : Not at all  Social Connections: Moderately Isolated (01/30/2024)   Social Connection and Isolation Panel    Frequency of Communication with Friends and Family: More than three times a week    Frequency of Social Gatherings with Friends and Family: More than three times a week    Attends Religious Services: More than 4 times per year    Active Member of [employer] or Organizations: No    Attends Banker Meetings: Never    Marital Status: Widowed     Family History:  The patient's family history includes Breast cancer (age of onset: 52) in her cousin; Diabetes in her mother; Stroke in her  father.  ROS:   12-point review of systems is negative unless otherwise noted in the HPI.   EKGs/Labs/Other Studies Reviewed:    Studies reviewed were summarized above. The additional studies were reviewed today:  Zio patch 12/2022: Patient had a min HR of 38 bpm, max HR of 144 bpm, and avg HR of 50 bpm. Predominant underlying rhythm was Sinus Rhythm.  First Degree AV Block was present. 30 Supraventricular Tachycardia runs occurred, the run with the fastest interval lasting 7 beats with a max rate of 144 bpm, the longest lasting 9 beats with an avg rate of 102 bpm.  Rare PACs and rare PVCs. __________   2D echo 01/01/2023: 1. Left ventricular ejection fraction, by estimation, is 60 to 65%. The  left ventricle has normal function. The left ventricle has no regional  wall motion abnormalities. Left ventricular diastolic parameters are  consistent with Grade I diastolic  dysfunction (impaired relaxation). The average left ventricular global  longitudinal strain is -22.1 %. The global longitudinal strain is normal.   2. Right ventricular systolic function is normal. The right ventricular  size is normal.   3. The mitral valve is normal in structure. Mild mitral valve  regurgitation.   4. The aortic valve was not well visualized. Aortic valve regurgitation  is not visualized.   5. The inferior vena cava is normal in size with greater than 50%  respiratory variability, suggesting right atrial pressure of 3 mmHg.   Comparison(s): 03/09/21 60-65%.  __________   2D echo 03/09/2021: 1. Left ventricular ejection fraction, by estimation, is 60 to 65%. The  left ventricle has normal function. The left ventricle has no regional  wall motion abnormalities. Left ventricular diastolic parameters are  consistent with Grade I diastolic  dysfunction (impaired relaxation).   2. Right ventricular systolic function is normal. The right ventricular  size is normal. Tricuspid regurgitation signal is  inadequate for assessing  PA pressure.   3. The mitral valve is normal in structure. Mild mitral valve  regurgitation.   4. Ectopy appreciated, heart rate 55 bpm. __________   Lexiscan  MPI 02/21/2020: The study is normal. This is a low risk study. The left ventricular ejection fraction  is hyperdynamic (>65%). There was no ST segment deviation noted during stress. No T wave inversion was noted during stress. __________   2D echo 09/2018: - Left ventricle: The cavity size was normal. Systolic function was    normal. The estimated ejection fraction was in the range of 60%    to 65%. Wall motion was normal; there were no regional wall    motion abnormalities. Doppler parameters are consistent with    abnormal left ventricular relaxation (grade 1 diastolic    dysfunction).  - Mitral valve: There was mild regurgitation.  - Left atrium: The atrium was normal in size.  - Right ventricle: Systolic function was normal.  - Pulmonary arteries: Systolic pressure was within the normal    range.  __________   ETT 08/2018: Blood pressure demonstrated a normal response to exercise. There was no ST segment deviation noted during stress. No T wave inversion was noted during stress.   Normal treadmill stress test with no evidence of ischemia. Below average exercise capacity with exercise duration of 3 minutes and 39 seconds achieving a maximum heart rate of 130 bpm which is 89% of maximal predicted heart rate.   Mild PACs noted in recovery. __________   Zio 07/2018: Normal sinus rhythm with an average heart rate of 62 bpm. 4 beat run of wide-complex tachycardia at a rate of 105 bpm. 18 episodes of supraventricular tachycardia noted with the longest lasting 16 beats at a rate of 109 bpm.  Fastest tachycardia was 162 bpm but only lasted 6 beats. Occasional PACs with rare PVCs.   EKG:  EKG is ordered today.  The EKG ordered today demonstrates sinus bradycardia, 45 bpm, no acute ST-T changes,  consistent with prior tracings  Recent Labs: 02/02/2024: ALT 6; BUN 27; Creatinine, Ser 1.30; Potassium 4.8; Sodium 140  Recent Lipid Panel    Component Value Date/Time   CHOL 139 02/02/2024 0921   TRIG 79.0 02/02/2024 0921   TRIG 68 12/12/2009 1413   HDL 66.70 02/02/2024 0921   CHOLHDL 2 02/02/2024 0921   VLDL 15.8 02/02/2024 0921   LDLCALC 57 02/02/2024 0921    PHYSICAL EXAM:    VS:  BP 118/81   Pulse (!) 45   Ht 4' 11 (1.499 m)   Wt 144 lb 6.4 oz (65.5 kg)   SpO2 97%   BMI 29.17 kg/m   BMI: Body mass index is 29.17 kg/m.  Physical Exam Vitals reviewed.  Constitutional:      Appearance: She is well-developed.  HENT:     Head: Normocephalic and atraumatic.  Eyes:     General:        Right eye: No discharge.        Left eye: No discharge.  Cardiovascular:     Rate and Rhythm: Regular rhythm. Bradycardia present.     Pulses:          Posterior tibial pulses are 2+ on the right side and 2+ on the left side.     Heart sounds: Normal heart sounds, S1 normal and S2 normal. Heart sounds not distant. No midsystolic click and no opening snap. No murmur heard.    No friction rub.  Pulmonary:     Effort: Pulmonary effort is normal. No respiratory distress.     Breath sounds: Normal breath sounds. No decreased breath sounds, wheezing, rhonchi or rales.  Musculoskeletal:     Cervical back: Normal range of motion.  Skin:    General: Skin is warm and dry.  Nails: There is no clubbing.  Neurological:     Mental Status: She is alert and oriented to person, place, and time.  Psychiatric:        Speech: Speech normal.        Behavior: Behavior normal.        Thought Content: Thought content normal.        Judgment: Judgment normal.     Wt Readings from Last 3 Encounters:  04/15/24 144 lb 6.4 oz (65.5 kg)  02/06/24 144 lb 2 oz (65.4 kg)  01/30/24 144 lb (65.3 kg)     ASSESSMENT & PLAN:   PSVT: Quiescent.  Brief paroxysms of SVT noted on Zio patch last year with  the longest episode lasting 9 beats.  Given baseline bradycardic rates, she has not been maintained on standing beta-blocker or nondihydropyridine calcium channel blocker.  No indication for AAT (concern for bradycardia) or ablation.  However, should she notice an increase in SVT burden, pacemaker may be indicated at that time in an effort to treat tachycardic rates without leading to significant progression of baseline bradycardia.  Bradycardia: Chronic and stable.  Asymptomatic outside of some underlying fatigue that has been longstanding.  No dizziness, presyncope, or syncope.  If her SVT burden progresses as outlined above, she might require a permanent pacemaker to be able to treat tachycardic rates.  HTN: Blood pressure controlled in the office today on amlodipine  5 mg and enalapril  20 mg.  Recent labs stable.  HLD: LDL 57 in 01/2024 with normal AST/ALT at that time.  She remains on Vytorin .  CKD stage IIIa: Largely stable.  Followed by nephrology.     Disposition: F/u with Dr. Darron or an APP in 6 months.   Medication Adjustments/Labs and Tests Ordered: Current medicines are reviewed at length with the patient today.  Concerns regarding medicines are outlined above. Medication changes, Labs and Tests ordered today are summarized above and listed in the Patient Instructions accessible in Encounters.   Signed, Bernardino Bring, PA-C 04/15/2024 9:03 AM     [facility] [place] [address] [place], [place] [address] [phone]

## 2024-05-24 DIAGNOSIS — I10 Essential (primary) hypertension: Secondary | ICD-10-CM | POA: Diagnosis not present

## 2024-05-24 DIAGNOSIS — N1831 Chronic kidney disease, stage 3a: Secondary | ICD-10-CM | POA: Diagnosis not present

## 2024-05-24 DIAGNOSIS — R809 Proteinuria, unspecified: Secondary | ICD-10-CM | POA: Diagnosis not present

## 2024-05-31 DIAGNOSIS — R809 Proteinuria, unspecified: Secondary | ICD-10-CM | POA: Diagnosis not present

## 2024-05-31 DIAGNOSIS — N2581 Secondary hyperparathyroidism of renal origin: Secondary | ICD-10-CM | POA: Diagnosis not present

## 2024-05-31 DIAGNOSIS — N1832 Chronic kidney disease, stage 3b: Secondary | ICD-10-CM | POA: Diagnosis not present

## 2024-05-31 DIAGNOSIS — I10 Essential (primary) hypertension: Secondary | ICD-10-CM | POA: Diagnosis not present

## 2024-05-31 DIAGNOSIS — D631 Anemia in chronic kidney disease: Secondary | ICD-10-CM | POA: Diagnosis not present

## 2024-08-24 ENCOUNTER — Other Ambulatory Visit: Payer: Self-pay | Admitting: Cardiovascular Disease

## 2024-09-21 ENCOUNTER — Other Ambulatory Visit: Payer: Self-pay | Admitting: Family Medicine

## 2024-09-21 DIAGNOSIS — Z1231 Encounter for screening mammogram for malignant neoplasm of breast: Secondary | ICD-10-CM

## 2024-10-14 ENCOUNTER — Ambulatory Visit
Admission: RE | Admit: 2024-10-14 | Discharge: 2024-10-14 | Disposition: A | Discharge status: Home / Self Care | Source: Ambulatory Visit | Attending: Family Medicine | Admitting: Family Medicine

## 2024-10-14 DIAGNOSIS — Z1231 Encounter for screening mammogram for malignant neoplasm of breast: Secondary | ICD-10-CM | POA: Diagnosis present

## 2024-10-20 ENCOUNTER — Ambulatory Visit: Payer: Self-pay | Admitting: Family Medicine

## 2024-11-12 ENCOUNTER — Ambulatory Visit: Admitting: Physician Assistant

## 2025-01-31 ENCOUNTER — Ambulatory Visit

## 2025-02-04 ENCOUNTER — Other Ambulatory Visit

## 2025-02-11 ENCOUNTER — Encounter: Admitting: Family Medicine
